# Patient Record
Sex: Male | Born: 1945 | Race: White | Hispanic: No | Marital: Married | Smoking: Current every day smoker
Health system: Southern US, Community
[De-identification: ages and names within clinical notes are randomized; demographics above are authoritative.]

## PROBLEM LIST (undated history)

## (undated) DIAGNOSIS — I7090 Unspecified atherosclerosis: Secondary | ICD-10-CM

## (undated) DIAGNOSIS — I4510 Unspecified right bundle-branch block: Secondary | ICD-10-CM

## (undated) DIAGNOSIS — I10 Essential (primary) hypertension: Secondary | ICD-10-CM

## (undated) DIAGNOSIS — Z87442 Personal history of urinary calculi: Secondary | ICD-10-CM

## (undated) DIAGNOSIS — K219 Gastro-esophageal reflux disease without esophagitis: Secondary | ICD-10-CM

## (undated) DIAGNOSIS — J449 Chronic obstructive pulmonary disease, unspecified: Secondary | ICD-10-CM

## (undated) DIAGNOSIS — I708 Atherosclerosis of other arteries: Secondary | ICD-10-CM

---

## 1968-07-06 HISTORY — PX: PILONIDAL CYST EXCISION: SHX744

## 1976-07-06 HISTORY — PX: KIDNEY SURGERY: SHX687

## 1978-07-06 HISTORY — PX: CYST REMOVAL LEG: SHX6280

## 2008-08-20 ENCOUNTER — Encounter: Admission: RE | Admit: 2008-08-20 | Discharge: 2008-08-20 | Payer: Self-pay | Admitting: Family Medicine

## 2010-02-03 ENCOUNTER — Emergency Department: Payer: Self-pay | Admitting: Emergency Medicine

## 2010-02-08 ENCOUNTER — Emergency Department: Payer: Self-pay | Admitting: Internal Medicine

## 2010-02-12 ENCOUNTER — Emergency Department: Payer: Self-pay | Admitting: Emergency Medicine

## 2012-12-22 DIAGNOSIS — Z1211 Encounter for screening for malignant neoplasm of colon: Secondary | ICD-10-CM | POA: Diagnosis not present

## 2012-12-22 DIAGNOSIS — Z23 Encounter for immunization: Secondary | ICD-10-CM | POA: Diagnosis not present

## 2012-12-22 DIAGNOSIS — Z125 Encounter for screening for malignant neoplasm of prostate: Secondary | ICD-10-CM | POA: Diagnosis not present

## 2012-12-22 DIAGNOSIS — Z0000 Encounter for general adult medical examination without abnormal findings: Secondary | ICD-10-CM | POA: Diagnosis not present

## 2012-12-22 DIAGNOSIS — I10 Essential (primary) hypertension: Secondary | ICD-10-CM | POA: Diagnosis not present

## 2012-12-22 DIAGNOSIS — R9431 Abnormal electrocardiogram [ECG] [EKG]: Secondary | ICD-10-CM | POA: Diagnosis not present

## 2012-12-22 DIAGNOSIS — I4510 Unspecified right bundle-branch block: Secondary | ICD-10-CM | POA: Diagnosis not present

## 2012-12-22 DIAGNOSIS — F17200 Nicotine dependence, unspecified, uncomplicated: Secondary | ICD-10-CM | POA: Diagnosis not present

## 2013-03-22 ENCOUNTER — Other Ambulatory Visit: Payer: Self-pay | Admitting: Family Medicine

## 2013-03-22 DIAGNOSIS — Z139 Encounter for screening, unspecified: Secondary | ICD-10-CM

## 2013-04-04 ENCOUNTER — Ambulatory Visit
Admission: RE | Admit: 2013-04-04 | Discharge: 2013-04-04 | Disposition: A | Discharge status: Home / Self Care | Payer: Medicare Other | Source: Ambulatory Visit | Attending: Family Medicine | Admitting: Family Medicine

## 2013-04-04 DIAGNOSIS — Z139 Encounter for screening, unspecified: Secondary | ICD-10-CM

## 2013-04-04 DIAGNOSIS — Z136 Encounter for screening for cardiovascular disorders: Secondary | ICD-10-CM | POA: Diagnosis not present

## 2013-07-20 DIAGNOSIS — I700 Atherosclerosis of aorta: Secondary | ICD-10-CM | POA: Diagnosis not present

## 2013-07-20 DIAGNOSIS — J449 Chronic obstructive pulmonary disease, unspecified: Secondary | ICD-10-CM | POA: Diagnosis not present

## 2013-07-20 DIAGNOSIS — I4510 Unspecified right bundle-branch block: Secondary | ICD-10-CM | POA: Diagnosis not present

## 2013-07-20 DIAGNOSIS — F17200 Nicotine dependence, unspecified, uncomplicated: Secondary | ICD-10-CM | POA: Diagnosis not present

## 2013-07-20 DIAGNOSIS — I10 Essential (primary) hypertension: Secondary | ICD-10-CM | POA: Diagnosis not present

## 2014-04-16 DIAGNOSIS — J449 Chronic obstructive pulmonary disease, unspecified: Secondary | ICD-10-CM | POA: Diagnosis not present

## 2014-04-16 DIAGNOSIS — I700 Atherosclerosis of aorta: Secondary | ICD-10-CM | POA: Diagnosis not present

## 2014-04-16 DIAGNOSIS — F17200 Nicotine dependence, unspecified, uncomplicated: Secondary | ICD-10-CM | POA: Diagnosis not present

## 2014-04-16 DIAGNOSIS — I4510 Unspecified right bundle-branch block: Secondary | ICD-10-CM | POA: Diagnosis not present

## 2014-04-16 DIAGNOSIS — D692 Other nonthrombocytopenic purpura: Secondary | ICD-10-CM | POA: Diagnosis not present

## 2014-04-16 DIAGNOSIS — Z23 Encounter for immunization: Secondary | ICD-10-CM | POA: Diagnosis not present

## 2014-04-16 DIAGNOSIS — Z125 Encounter for screening for malignant neoplasm of prostate: Secondary | ICD-10-CM | POA: Diagnosis not present

## 2014-04-16 DIAGNOSIS — Z0000 Encounter for general adult medical examination without abnormal findings: Secondary | ICD-10-CM | POA: Diagnosis not present

## 2014-04-16 DIAGNOSIS — I10 Essential (primary) hypertension: Secondary | ICD-10-CM | POA: Diagnosis not present

## 2014-05-03 DIAGNOSIS — Z1211 Encounter for screening for malignant neoplasm of colon: Secondary | ICD-10-CM | POA: Diagnosis not present

## 2014-10-18 DIAGNOSIS — J449 Chronic obstructive pulmonary disease, unspecified: Secondary | ICD-10-CM | POA: Diagnosis not present

## 2014-10-18 DIAGNOSIS — I700 Atherosclerosis of aorta: Secondary | ICD-10-CM | POA: Diagnosis not present

## 2014-10-18 DIAGNOSIS — I10 Essential (primary) hypertension: Secondary | ICD-10-CM | POA: Diagnosis not present

## 2014-10-18 DIAGNOSIS — D692 Other nonthrombocytopenic purpura: Secondary | ICD-10-CM | POA: Diagnosis not present

## 2014-10-18 DIAGNOSIS — F17200 Nicotine dependence, unspecified, uncomplicated: Secondary | ICD-10-CM | POA: Diagnosis not present

## 2014-12-10 IMAGING — US US AORTA SCREENING (MEDICARE)
1 series · 14 of 15 positions shown · non-contrast
Comparison: None.

CLINICAL DATA: AAA screening

ABDOMINAL AORTA SCREENING ULTRASOUND
TECHNIQUE: Ultrasound examination of the abdominal aorta was
performed as a screening evaluation for abdominal aortic aneurysm.

[Series 1: us aorta screening (medicare) · 0.33mm/px · 14 of 15 slices shown]
[im 1/15]
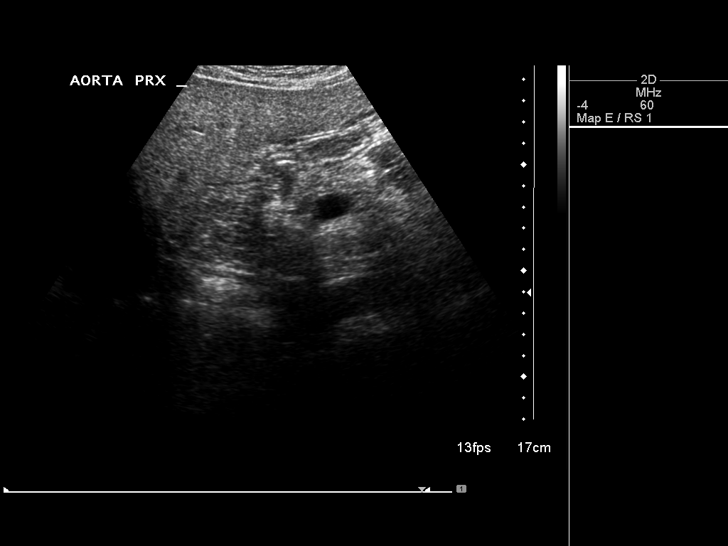
[im 2/15]
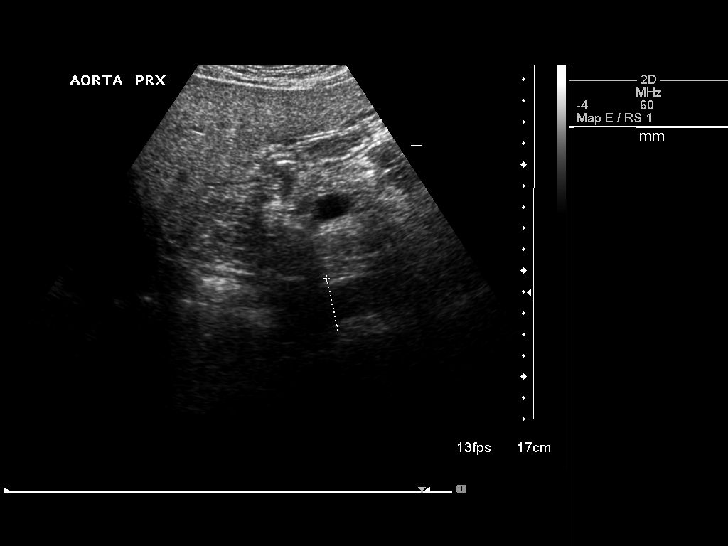
[im 3/15]
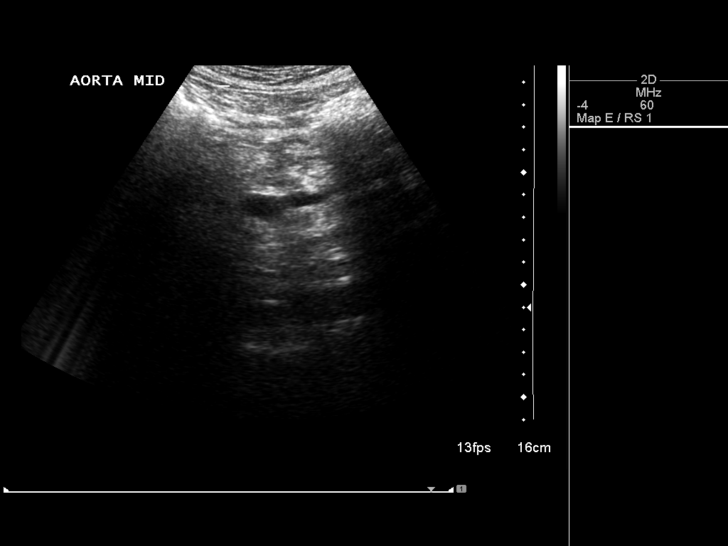
[im 4/15]
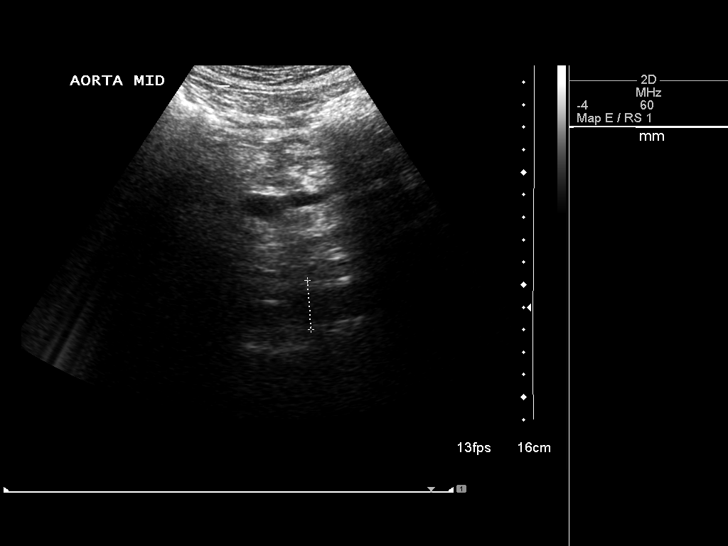
[im 5/15]
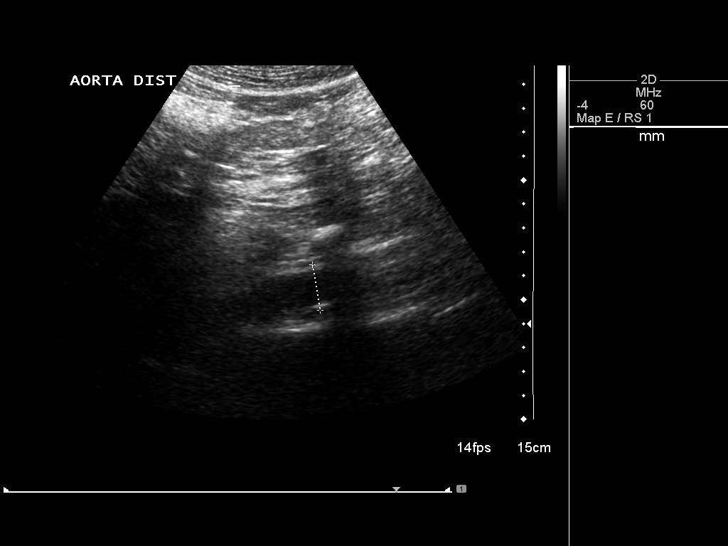
[im 6/15]
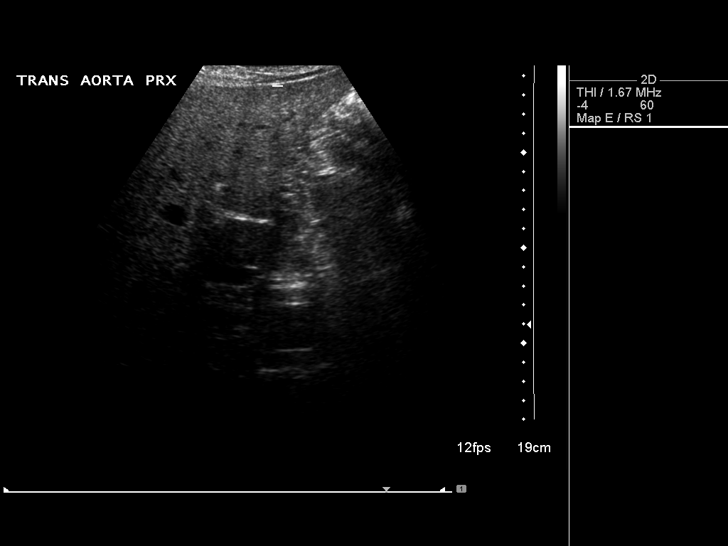
[im 7/15]
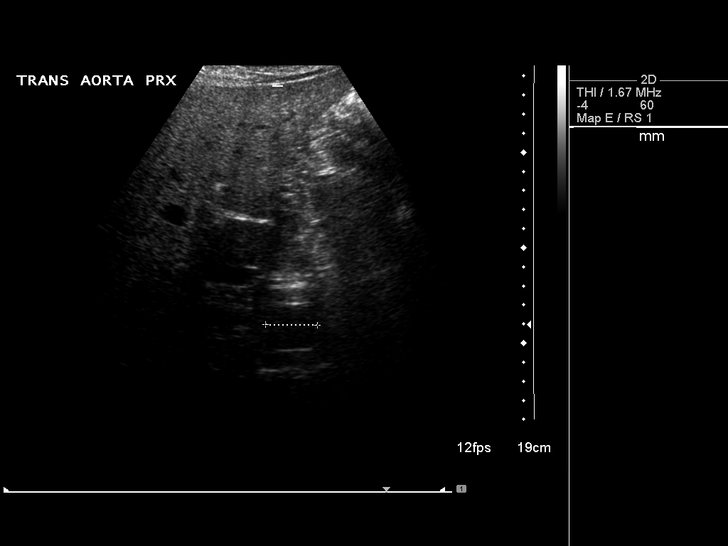
[im 9/15]
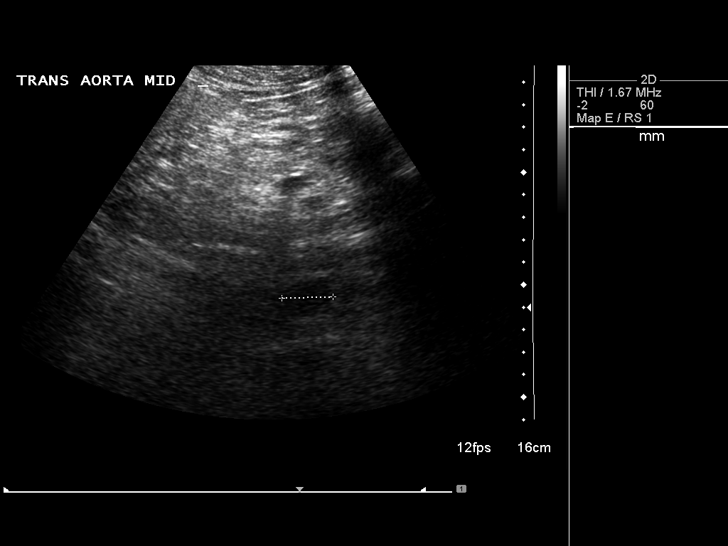
[im 10/15]
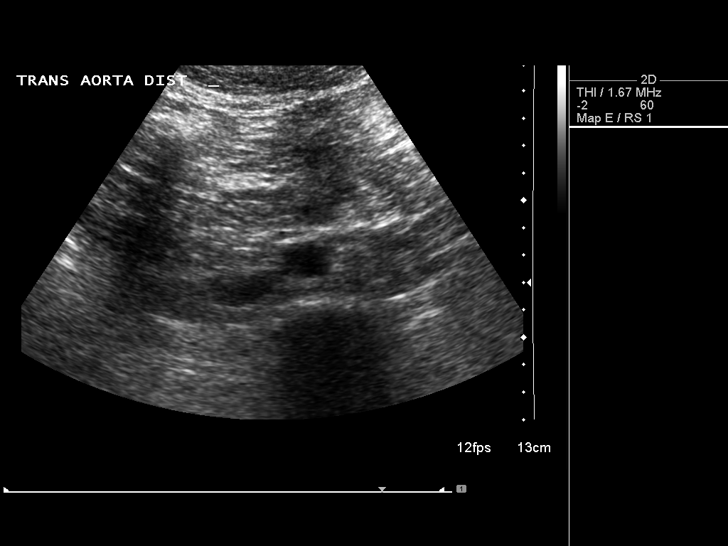
[im 11/15]
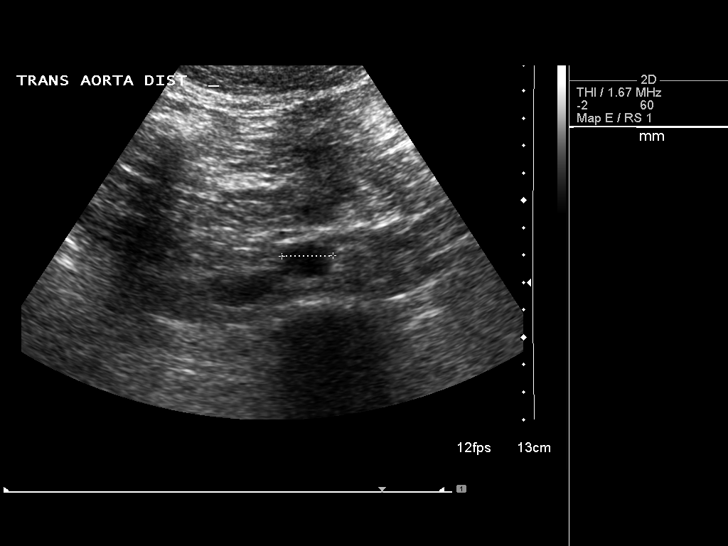
[im 12/15]
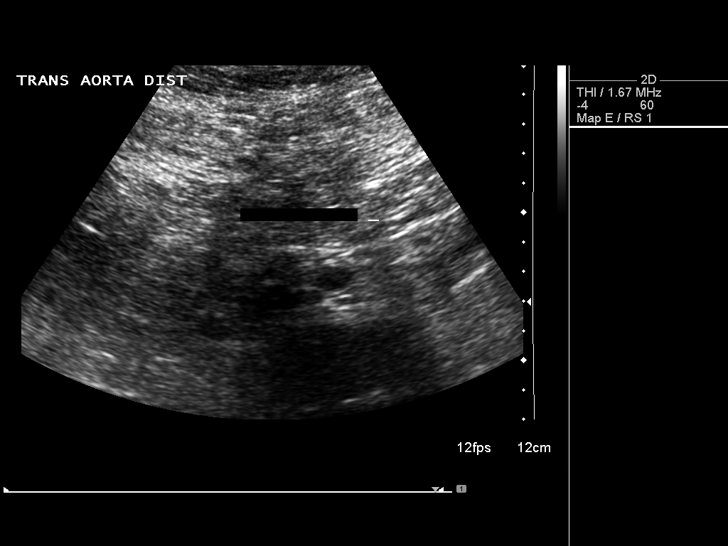
[im 13/15]
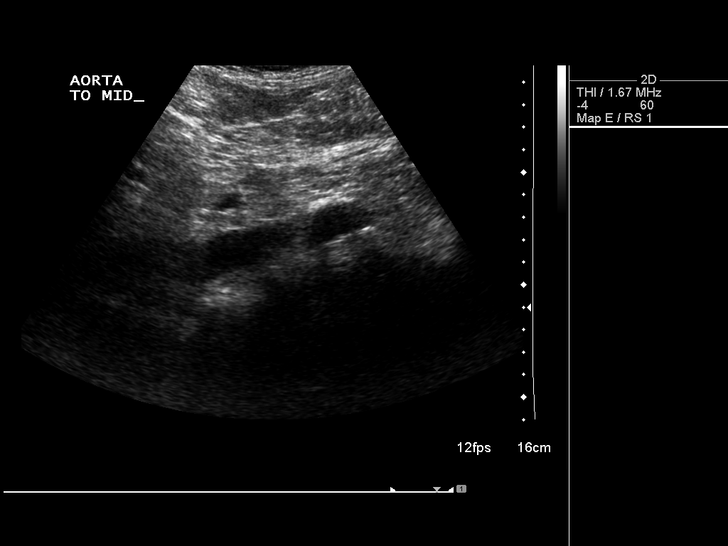
[im 14/15]
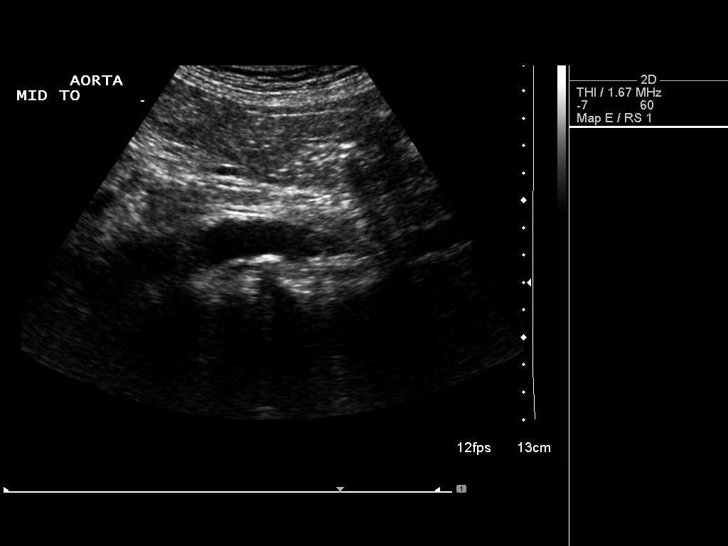
[im 15/15]
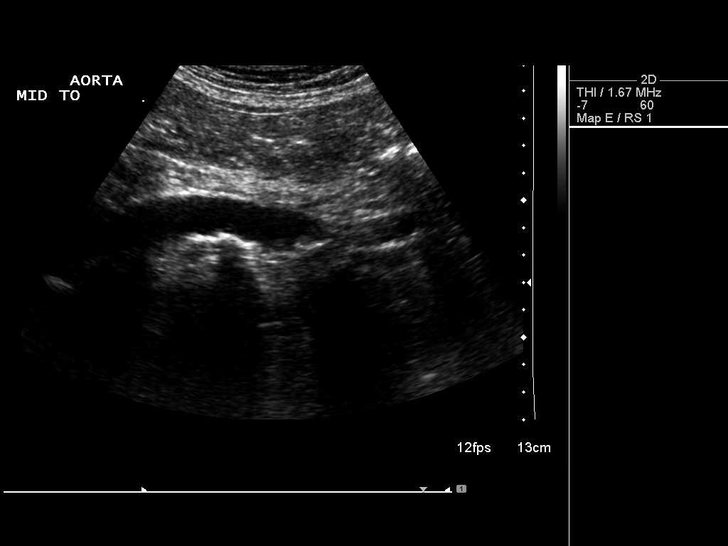

[14 of 15 positions shown; findings below may reference images not displayed]

Abdominal Aorta: Mild heterogeneous atherosclerotic plaque.  No
aneurysmal dilatation.

      Maximum AP diameter:  2.4 cm.
      Maximum TRV diameter:  2.7 cm.
IMPRESSION: Atherosclerotic plaque with out abdominal aortic aneurysmal
dilatation.

## 2015-04-24 DIAGNOSIS — F17200 Nicotine dependence, unspecified, uncomplicated: Secondary | ICD-10-CM | POA: Diagnosis not present

## 2015-04-24 DIAGNOSIS — D692 Other nonthrombocytopenic purpura: Secondary | ICD-10-CM | POA: Diagnosis not present

## 2015-04-24 DIAGNOSIS — Z125 Encounter for screening for malignant neoplasm of prostate: Secondary | ICD-10-CM | POA: Diagnosis not present

## 2015-04-24 DIAGNOSIS — Z1211 Encounter for screening for malignant neoplasm of colon: Secondary | ICD-10-CM | POA: Diagnosis not present

## 2015-04-24 DIAGNOSIS — I4510 Unspecified right bundle-branch block: Secondary | ICD-10-CM | POA: Diagnosis not present

## 2015-04-24 DIAGNOSIS — I10 Essential (primary) hypertension: Secondary | ICD-10-CM | POA: Diagnosis not present

## 2015-04-24 DIAGNOSIS — Z0000 Encounter for general adult medical examination without abnormal findings: Secondary | ICD-10-CM | POA: Diagnosis not present

## 2015-04-24 DIAGNOSIS — J449 Chronic obstructive pulmonary disease, unspecified: Secondary | ICD-10-CM | POA: Diagnosis not present

## 2015-04-24 DIAGNOSIS — I700 Atherosclerosis of aorta: Secondary | ICD-10-CM | POA: Diagnosis not present

## 2015-05-07 DIAGNOSIS — Z1211 Encounter for screening for malignant neoplasm of colon: Secondary | ICD-10-CM | POA: Diagnosis not present

## 2016-04-29 DIAGNOSIS — F17200 Nicotine dependence, unspecified, uncomplicated: Secondary | ICD-10-CM | POA: Diagnosis not present

## 2016-04-29 DIAGNOSIS — I10 Essential (primary) hypertension: Secondary | ICD-10-CM | POA: Diagnosis not present

## 2016-04-29 DIAGNOSIS — Z1211 Encounter for screening for malignant neoplasm of colon: Secondary | ICD-10-CM | POA: Diagnosis not present

## 2016-04-29 DIAGNOSIS — Z125 Encounter for screening for malignant neoplasm of prostate: Secondary | ICD-10-CM | POA: Diagnosis not present

## 2016-04-29 DIAGNOSIS — Z0000 Encounter for general adult medical examination without abnormal findings: Secondary | ICD-10-CM | POA: Diagnosis not present

## 2016-04-29 DIAGNOSIS — I700 Atherosclerosis of aorta: Secondary | ICD-10-CM | POA: Diagnosis not present

## 2016-04-29 DIAGNOSIS — J449 Chronic obstructive pulmonary disease, unspecified: Secondary | ICD-10-CM | POA: Diagnosis not present

## 2016-04-29 DIAGNOSIS — R7309 Other abnormal glucose: Secondary | ICD-10-CM | POA: Diagnosis not present

## 2016-04-29 DIAGNOSIS — I4510 Unspecified right bundle-branch block: Secondary | ICD-10-CM | POA: Diagnosis not present

## 2016-04-29 DIAGNOSIS — D692 Other nonthrombocytopenic purpura: Secondary | ICD-10-CM | POA: Diagnosis not present

## 2016-08-21 DIAGNOSIS — R809 Proteinuria, unspecified: Secondary | ICD-10-CM | POA: Diagnosis not present

## 2016-10-28 DIAGNOSIS — I700 Atherosclerosis of aorta: Secondary | ICD-10-CM | POA: Diagnosis not present

## 2016-10-28 DIAGNOSIS — R7309 Other abnormal glucose: Secondary | ICD-10-CM | POA: Diagnosis not present

## 2016-10-28 DIAGNOSIS — D692 Other nonthrombocytopenic purpura: Secondary | ICD-10-CM | POA: Diagnosis not present

## 2016-10-28 DIAGNOSIS — I10 Essential (primary) hypertension: Secondary | ICD-10-CM | POA: Diagnosis not present

## 2016-10-28 DIAGNOSIS — J449 Chronic obstructive pulmonary disease, unspecified: Secondary | ICD-10-CM | POA: Diagnosis not present

## 2016-10-28 DIAGNOSIS — F17200 Nicotine dependence, unspecified, uncomplicated: Secondary | ICD-10-CM | POA: Diagnosis not present

## 2017-05-12 ENCOUNTER — Other Ambulatory Visit (HOSPITAL_COMMUNITY): Payer: Self-pay | Admitting: Family Medicine

## 2017-05-12 ENCOUNTER — Other Ambulatory Visit: Payer: Self-pay | Admitting: Family Medicine

## 2017-05-12 ENCOUNTER — Ambulatory Visit (HOSPITAL_COMMUNITY): Payer: Medicare Other

## 2017-05-12 DIAGNOSIS — R600 Localized edema: Secondary | ICD-10-CM

## 2017-05-12 DIAGNOSIS — R609 Edema, unspecified: Secondary | ICD-10-CM

## 2017-05-12 DIAGNOSIS — R7309 Other abnormal glucose: Secondary | ICD-10-CM | POA: Diagnosis not present

## 2017-05-12 DIAGNOSIS — I700 Atherosclerosis of aorta: Secondary | ICD-10-CM | POA: Diagnosis not present

## 2017-05-12 DIAGNOSIS — D692 Other nonthrombocytopenic purpura: Secondary | ICD-10-CM | POA: Diagnosis not present

## 2017-05-12 DIAGNOSIS — Z0000 Encounter for general adult medical examination without abnormal findings: Secondary | ICD-10-CM | POA: Diagnosis not present

## 2017-05-12 DIAGNOSIS — Z1159 Encounter for screening for other viral diseases: Secondary | ICD-10-CM | POA: Diagnosis not present

## 2017-05-12 DIAGNOSIS — J449 Chronic obstructive pulmonary disease, unspecified: Secondary | ICD-10-CM | POA: Diagnosis not present

## 2017-05-12 DIAGNOSIS — F17200 Nicotine dependence, unspecified, uncomplicated: Secondary | ICD-10-CM | POA: Diagnosis not present

## 2017-05-12 DIAGNOSIS — Z1211 Encounter for screening for malignant neoplasm of colon: Secondary | ICD-10-CM | POA: Diagnosis not present

## 2017-05-12 DIAGNOSIS — Z125 Encounter for screening for malignant neoplasm of prostate: Secondary | ICD-10-CM | POA: Diagnosis not present

## 2017-05-12 DIAGNOSIS — I10 Essential (primary) hypertension: Secondary | ICD-10-CM | POA: Diagnosis not present

## 2017-05-13 ENCOUNTER — Ambulatory Visit
Admission: RE | Admit: 2017-05-13 | Discharge: 2017-05-13 | Disposition: A | Discharge status: Home / Self Care | Payer: Medicare Other | Source: Ambulatory Visit | Attending: Family Medicine | Admitting: Family Medicine

## 2017-05-13 DIAGNOSIS — R600 Localized edema: Secondary | ICD-10-CM | POA: Diagnosis not present

## 2017-05-19 DIAGNOSIS — Z1211 Encounter for screening for malignant neoplasm of colon: Secondary | ICD-10-CM | POA: Diagnosis not present

## 2017-11-10 DIAGNOSIS — F17210 Nicotine dependence, cigarettes, uncomplicated: Secondary | ICD-10-CM | POA: Diagnosis not present

## 2017-11-10 DIAGNOSIS — R7309 Other abnormal glucose: Secondary | ICD-10-CM | POA: Diagnosis not present

## 2017-11-10 DIAGNOSIS — D692 Other nonthrombocytopenic purpura: Secondary | ICD-10-CM | POA: Diagnosis not present

## 2017-11-10 DIAGNOSIS — J449 Chronic obstructive pulmonary disease, unspecified: Secondary | ICD-10-CM | POA: Diagnosis not present

## 2017-11-10 DIAGNOSIS — I10 Essential (primary) hypertension: Secondary | ICD-10-CM | POA: Diagnosis not present

## 2018-05-16 DIAGNOSIS — I10 Essential (primary) hypertension: Secondary | ICD-10-CM | POA: Diagnosis not present

## 2018-05-16 DIAGNOSIS — L989 Disorder of the skin and subcutaneous tissue, unspecified: Secondary | ICD-10-CM | POA: Diagnosis not present

## 2018-05-16 DIAGNOSIS — D0461 Carcinoma in situ of skin of right upper limb, including shoulder: Secondary | ICD-10-CM | POA: Diagnosis not present

## 2018-05-16 DIAGNOSIS — R7309 Other abnormal glucose: Secondary | ICD-10-CM | POA: Diagnosis not present

## 2018-05-16 DIAGNOSIS — K429 Umbilical hernia without obstruction or gangrene: Secondary | ICD-10-CM | POA: Diagnosis not present

## 2018-05-16 DIAGNOSIS — J449 Chronic obstructive pulmonary disease, unspecified: Secondary | ICD-10-CM | POA: Diagnosis not present

## 2018-05-16 DIAGNOSIS — Z125 Encounter for screening for malignant neoplasm of prostate: Secondary | ICD-10-CM | POA: Diagnosis not present

## 2018-05-16 DIAGNOSIS — I700 Atherosclerosis of aorta: Secondary | ICD-10-CM | POA: Diagnosis not present

## 2018-05-16 DIAGNOSIS — F17210 Nicotine dependence, cigarettes, uncomplicated: Secondary | ICD-10-CM | POA: Diagnosis not present

## 2018-05-16 DIAGNOSIS — D692 Other nonthrombocytopenic purpura: Secondary | ICD-10-CM | POA: Diagnosis not present

## 2018-05-16 DIAGNOSIS — Z0000 Encounter for general adult medical examination without abnormal findings: Secondary | ICD-10-CM | POA: Diagnosis not present

## 2018-05-16 DIAGNOSIS — Z1211 Encounter for screening for malignant neoplasm of colon: Secondary | ICD-10-CM | POA: Diagnosis not present

## 2018-05-20 DIAGNOSIS — Z1211 Encounter for screening for malignant neoplasm of colon: Secondary | ICD-10-CM | POA: Diagnosis not present

## 2018-05-27 ENCOUNTER — Ambulatory Visit: Payer: Self-pay | Admitting: Surgery

## 2018-05-27 DIAGNOSIS — K429 Umbilical hernia without obstruction or gangrene: Secondary | ICD-10-CM | POA: Diagnosis not present

## 2018-05-27 NOTE — H&P (Signed)
Terry Garcia Documented: 05/27/2018 11:36 AM Location: [facility] Patient #: [id number] DOB: [birth year] Married / Language: [place] / Race: White Male  History of Present Illness Maisie Fus A. Niva Murren MD; 05/27/2018 11:37 AM) Patient words: Patient sent at the request of Dr. Cam Hai for umbilical hernia. The patient has had this hernia for over 20 years. The larger and causing more discomfort. He is a smoker. He has no intention of quitting. The bulge is made worse with straining, coughing or lifting.  The patient is a 72 year old male.    Physical Exam (Thelia Tanksley A. Chinmay Squier MD; 05/27/2018 11:38 AM)  General Mental Status-Alert. General Appearance-Consistent with stated age. Hydration-Well hydrated. Voice-Normal.  Head and Neck Head-normocephalic, atraumatic with no lesions or palpable masses. Trachea-midline. Thyroid Gland Characteristics - normal size and consistency.  Chest and Lung Exam Chest and lung exam reveals -quiet, even and easy respiratory effort with no use of accessory muscles and on auscultation, normal breath sounds, no adventitious sounds and normal vocal resonance. Inspection Chest Wall - Normal. Back - normal.  Cardiovascular Cardiovascular examination reveals -normal heart sounds, regular rate and rhythm with no murmurs and normal pedal pulses bilaterally.  Abdomen Note: Large reducible umbilical hernia. No rebound or guarding.  Neurologic Neurologic evaluation reveals -alert and oriented x 3 with no impairment of recent or remote memory. Mental Status-Normal.  Musculoskeletal Normal Exam - Left-Upper Extremity Strength Normal and Lower Extremity Strength Normal. Normal Exam - Right-Upper Extremity Strength Normal and Lower Extremity Strength Normal.    Assessment & Plan (Claud Gowan A. Annabel Gibeau MD; 05/27/2018 11:38 AM)  UMBILICAL HERNIA WITHOUT OBSTRUCTION AND WITHOUT GANGRENE (K42.9) Impression: Discussed  repair with mesh. It is getting larger. Discussed increased operative risk given his continued tobacco abuse at high risk for complication of postoperative morbidity and potential mortality. He understands he involved. I discussed the use of mesh and chronic pain as well. The risk of hernia repair include bleeding, infection, organ injury, bowel injury, bladder injury, nerve injury recurrent hernia, blood clots, worsening of underlying condition, chronic pain, mesh use, open surgery, death, and the need for other operattions. Pt agrees to proceed  Current Plans You are being scheduled for surgery- Our schedulers will call you.  You should hear from our office's scheduling department within 5 working days about the location, date, and time of surgery. We try to make accommodations for patient's preferences in scheduling surgery, but sometimes the OR schedule or the surgeon's schedule prevents [place] from making those accommodations.  If you have not heard from our office [phone]) in 5 working days, call the office and ask for your surgeon's nurse.  If you have other questions about your diagnosis, plan, or surgery, call the office and ask for your surgeon's nurse.  Pt Education - Pamphlet Given - Hernia Surgery: discussed with patient and provided information. The anatomy & physiology of the abdominal wall was discussed. The pathophysiology of hernias was discussed. Natural history risks without surgery including progeressive enlargement, pain, incarceration, & strangulation was discussed. Contributors to complications such as smoking, obesity, diabetes, prior surgery, etc were discussed.  I feel the risks of no intervention will lead to serious problems that outweigh the operative risks; therefore, I recommended surgery to reduce and repair the hernia. I explaine an open approach. I noted the probable use of mesh to patch and/or buttress the hernia repair  Risks such as bleeding,  infection, abscess, need for further treatment, heart attack, death, and other risks were discussed. I noted  a good likelihood this will help address the problem. Goals of post-operative recovery were discussed as well. Possibility that this will not correct all symptoms was explained. I stressed the importance of low-impact activity, aggressive pain control, avoiding constipation, & not pushing through pain to minimize risk of post-operative chronic pain or injury. Possibility of reherniation especially with smoking, obesity, diabetes, immunosuppression, and other health conditions was discussed. We will work to minimize complications.  An educational handout further explaining the pathology & treatment options was given as well. Questions were answered. The patient expresses understanding & wishes to proceed with surgery.  Pt Education - CCS Mesh education: discussed with patient and provided information.

## 2018-07-04 ENCOUNTER — Other Ambulatory Visit: Payer: Self-pay

## 2018-07-04 ENCOUNTER — Encounter (HOSPITAL_BASED_OUTPATIENT_CLINIC_OR_DEPARTMENT_OTHER): Payer: Self-pay

## 2018-07-07 ENCOUNTER — Encounter (HOSPITAL_BASED_OUTPATIENT_CLINIC_OR_DEPARTMENT_OTHER)
Admission: RE | Admit: 2018-07-07 | Discharge: 2018-07-07 | Disposition: A | Discharge status: Home / Self Care | Payer: Medicare Other | Source: Ambulatory Visit | Attending: Surgery | Admitting: Surgery

## 2018-07-07 DIAGNOSIS — Z01818 Encounter for other preprocedural examination: Secondary | ICD-10-CM | POA: Diagnosis not present

## 2018-07-07 LAB — CBC WITH DIFFERENTIAL/PLATELET
Abs Immature Granulocytes: 0.01 10*3/uL (ref 0.00–0.07)
BASOS PCT: 1 %
Basophils Absolute: 0 10*3/uL (ref 0.0–0.1)
Eosinophils Absolute: 0.4 10*3/uL (ref 0.0–0.5)
Eosinophils Relative: 5 %
HCT: 49.7 % (ref 39.0–52.0)
Hemoglobin: 16.7 g/dL (ref 13.0–17.0)
Immature Granulocytes: 0 %
Lymphocytes Relative: 40 %
Lymphs Abs: 2.8 10*3/uL (ref 0.7–4.0)
MCH: 30.4 pg (ref 26.0–34.0)
MCHC: 33.6 g/dL (ref 30.0–36.0)
MCV: 90.5 fL (ref 80.0–100.0)
Monocytes Absolute: 0.7 10*3/uL (ref 0.1–1.0)
Monocytes Relative: 10 %
Neutro Abs: 3.1 10*3/uL (ref 1.7–7.7)
Neutrophils Relative %: 44 %
PLATELETS: 162 10*3/uL (ref 150–400)
RBC: 5.49 MIL/uL (ref 4.22–5.81)
RDW: 12.5 % (ref 11.5–15.5)
WBC: 6.9 10*3/uL (ref 4.0–10.5)
nRBC: 0 % (ref 0.0–0.2)

## 2018-07-07 LAB — COMPREHENSIVE METABOLIC PANEL
ALT: 35 U/L (ref 0–44)
AST: 26 U/L (ref 15–41)
Albumin: 4.1 g/dL (ref 3.5–5.0)
Alkaline Phosphatase: 52 U/L (ref 38–126)
Anion gap: 9 (ref 5–15)
BUN: 5 mg/dL — AB (ref 8–23)
CO2: 29 mmol/L (ref 22–32)
Calcium: 10 mg/dL (ref 8.9–10.3)
Chloride: 98 mmol/L (ref 98–111)
Creatinine, Ser: 0.79 mg/dL (ref 0.61–1.24)
GFR calc Af Amer: >60 mL/min (ref >60)
GFR calc non Af Amer: >60 mL/min (ref >60)
Glucose, Bld: 113 mg/dL — ABNORMAL HIGH (ref 70–99)
POTASSIUM: 4.2 mmol/L (ref 3.5–5.1)
Sodium: 136 mmol/L (ref 135–145)
Total Bilirubin: 0.8 mg/dL (ref 0.3–1.2)
Total Protein: 6.9 g/dL (ref 6.5–8.1)

## 2018-07-07 NOTE — Progress Notes (Addendum)
Ensure pre surgery drink given with instructions to complete by [facility], pt verbalized understanding.  EKG reviewed by Dr. Armond Hang, will proceed with surgery as scheduled.

## 2018-07-13 ENCOUNTER — Ambulatory Visit (HOSPITAL_BASED_OUTPATIENT_CLINIC_OR_DEPARTMENT_OTHER)
Admission: RE | Admit: 2018-07-13 | Discharge: 2018-07-13 | Disposition: A | Discharge status: Home / Self Care | Payer: Medicare Other | Attending: Surgery | Admitting: Surgery

## 2018-07-13 ENCOUNTER — Ambulatory Visit (HOSPITAL_BASED_OUTPATIENT_CLINIC_OR_DEPARTMENT_OTHER): Payer: Medicare Other | Admitting: Anesthesiology

## 2018-07-13 ENCOUNTER — Other Ambulatory Visit: Payer: Self-pay

## 2018-07-13 ENCOUNTER — Encounter (HOSPITAL_BASED_OUTPATIENT_CLINIC_OR_DEPARTMENT_OTHER): Payer: Self-pay

## 2018-07-13 ENCOUNTER — Encounter (HOSPITAL_BASED_OUTPATIENT_CLINIC_OR_DEPARTMENT_OTHER)
Admission: RE | Disposition: A | Discharge status: Home / Self Care | Payer: Self-pay | Source: Home / Self Care | Attending: Surgery

## 2018-07-13 DIAGNOSIS — J449 Chronic obstructive pulmonary disease, unspecified: Secondary | ICD-10-CM | POA: Insufficient documentation

## 2018-07-13 DIAGNOSIS — Z79899 Other long term (current) drug therapy: Secondary | ICD-10-CM | POA: Insufficient documentation

## 2018-07-13 DIAGNOSIS — K429 Umbilical hernia without obstruction or gangrene: Secondary | ICD-10-CM | POA: Insufficient documentation

## 2018-07-13 DIAGNOSIS — F17200 Nicotine dependence, unspecified, uncomplicated: Secondary | ICD-10-CM | POA: Diagnosis not present

## 2018-07-13 DIAGNOSIS — I10 Essential (primary) hypertension: Secondary | ICD-10-CM | POA: Insufficient documentation

## 2018-07-13 DIAGNOSIS — Z419 Encounter for procedure for purposes other than remedying health state, unspecified: Secondary | ICD-10-CM

## 2018-07-13 HISTORY — DX: Essential (primary) hypertension: I10

## 2018-07-13 HISTORY — PX: INSERTION OF MESH: SHX5868

## 2018-07-13 HISTORY — DX: Unspecified atherosclerosis: I70.90

## 2018-07-13 HISTORY — DX: Personal history of urinary calculi: Z87.442

## 2018-07-13 HISTORY — DX: Chronic obstructive pulmonary disease, unspecified: J44.9

## 2018-07-13 HISTORY — DX: Atherosclerosis of other arteries: I70.8

## 2018-07-13 HISTORY — DX: Gastro-esophageal reflux disease without esophagitis: K21.9

## 2018-07-13 HISTORY — PX: UMBILICAL HERNIA REPAIR: SHX196

## 2018-07-13 HISTORY — DX: Unspecified right bundle-branch block: I45.10

## 2018-07-13 SURGERY — REPAIR, HERNIA, UMBILICAL, ADULT
Anesthesia: General | Site: Abdomen

## 2018-07-13 MED ORDER — PROPOFOL 10 MG/ML IV BOLUS
INTRAVENOUS | Status: DC | PRN
  Administered 2018-07-13: 150 mg via INTRAVENOUS

## 2018-07-13 MED ORDER — ACETAMINOPHEN 500 MG PO TABS
ORAL_TABLET | ORAL | Status: AC
  Filled 2018-07-13: qty 2

## 2018-07-13 MED ORDER — FENTANYL CITRATE (PF) 100 MCG/2ML IJ SOLN
INTRAMUSCULAR | Status: DC | PRN
  Administered 2018-07-13: 100 ug via INTRAVENOUS

## 2018-07-13 MED ORDER — CHLORHEXIDINE GLUCONATE CLOTH 2 % EX PADS: 6.0000 | MEDICATED_PAD | Freq: Once | CUTANEOUS | Status: DC

## 2018-07-13 MED ORDER — MIDAZOLAM HCL 2 MG/2ML IJ SOLN: 1.0000 mg | INTRAMUSCULAR | Status: DC | PRN

## 2018-07-13 MED ORDER — LACTATED RINGERS IV SOLN
INTRAVENOUS | Status: DC
  Administered 2018-07-13: 09:00:00 via INTRAVENOUS

## 2018-07-13 MED ORDER — IBUPROFEN 800 MG PO TABS: 800.0000 mg | ORAL_TABLET | Freq: Three times a day (TID) | ORAL | 0 refills | Status: DC | PRN

## 2018-07-13 MED ORDER — OXYCODONE HCL 5 MG PO TABS: 5.0000 mg | ORAL_TABLET | Freq: Four times a day (QID) | ORAL | 0 refills | Status: DC | PRN

## 2018-07-13 MED ORDER — SUGAMMADEX SODIUM 200 MG/2ML IV SOLN
INTRAVENOUS | Status: AC
  Filled 2018-07-13: qty 2

## 2018-07-13 MED ORDER — LIDOCAINE HCL (CARDIAC) PF 100 MG/5ML IV SOSY
PREFILLED_SYRINGE | INTRAVENOUS | Status: DC | PRN
  Administered 2018-07-13: 50 mg via INTRAVENOUS

## 2018-07-13 MED ORDER — FENTANYL CITRATE (PF) 100 MCG/2ML IJ SOLN: 50.0000 ug | INTRAMUSCULAR | Status: DC | PRN

## 2018-07-13 MED ORDER — CEFAZOLIN SODIUM-DEXTROSE 2-4 GM/100ML-% IV SOLN
2.0000 g | INTRAVENOUS | Status: AC
  Administered 2018-07-13: 2 g via INTRAVENOUS

## 2018-07-13 MED ORDER — FENTANYL CITRATE (PF) 100 MCG/2ML IJ SOLN
INTRAMUSCULAR | Status: AC
  Filled 2018-07-13: qty 2

## 2018-07-13 MED ORDER — PROMETHAZINE HCL 25 MG/ML IJ SOLN: 6.2500 mg | INTRAMUSCULAR | Status: DC | PRN

## 2018-07-13 MED ORDER — CELECOXIB 200 MG PO CAPS
ORAL_CAPSULE | ORAL | Status: AC
  Filled 2018-07-13: qty 1

## 2018-07-13 MED ORDER — ROCURONIUM BROMIDE 50 MG/5ML IV SOSY
PREFILLED_SYRINGE | INTRAVENOUS | Status: AC
  Filled 2018-07-13: qty 5

## 2018-07-13 MED ORDER — BUPIVACAINE-EPINEPHRINE 0.25% -1:200000 IJ SOLN
INTRAMUSCULAR | Status: DC | PRN
  Administered 2018-07-13: 10 mL

## 2018-07-13 MED ORDER — GABAPENTIN 300 MG PO CAPS
ORAL_CAPSULE | ORAL | Status: AC
  Filled 2018-07-13: qty 1

## 2018-07-13 MED ORDER — DEXAMETHASONE SODIUM PHOSPHATE 10 MG/ML IJ SOLN
INTRAMUSCULAR | Status: AC
  Filled 2018-07-13: qty 1

## 2018-07-13 MED ORDER — MIDAZOLAM HCL 2 MG/2ML IJ SOLN
INTRAMUSCULAR | Status: AC
  Filled 2018-07-13: qty 2

## 2018-07-13 MED ORDER — LIDOCAINE 2% (20 MG/ML) 5 ML SYRINGE
INTRAMUSCULAR | Status: AC
  Filled 2018-07-13: qty 5

## 2018-07-13 MED ORDER — MIDAZOLAM HCL 5 MG/5ML IJ SOLN
INTRAMUSCULAR | Status: DC | PRN
  Administered 2018-07-13: 2 mg via INTRAVENOUS

## 2018-07-13 MED ORDER — LACTATED RINGERS IV SOLN: INTRAVENOUS | Status: DC

## 2018-07-13 MED ORDER — PROPOFOL 10 MG/ML IV BOLUS
INTRAVENOUS | Status: AC
  Filled 2018-07-13: qty 20

## 2018-07-13 MED ORDER — CELECOXIB 200 MG PO CAPS
200.0000 mg | ORAL_CAPSULE | ORAL | Status: AC
  Administered 2018-07-13: 200 mg via ORAL

## 2018-07-13 MED ORDER — SCOPOLAMINE 1 MG/3DAYS TD PT72: 1.0000 | MEDICATED_PATCH | Freq: Once | TRANSDERMAL | Status: DC | PRN

## 2018-07-13 MED ORDER — OXYCODONE HCL 5 MG PO TABS: 5.0000 mg | ORAL_TABLET | Freq: Once | ORAL | Status: DC | PRN

## 2018-07-13 MED ORDER — CEFAZOLIN SODIUM-DEXTROSE 2-4 GM/100ML-% IV SOLN
INTRAVENOUS | Status: AC
  Filled 2018-07-13: qty 100

## 2018-07-13 MED ORDER — SUGAMMADEX SODIUM 200 MG/2ML IV SOLN
INTRAVENOUS | Status: DC | PRN
  Administered 2018-07-13: 200 mg via INTRAVENOUS

## 2018-07-13 MED ORDER — FENTANYL CITRATE (PF) 100 MCG/2ML IJ SOLN: 25.0000 ug | INTRAMUSCULAR | Status: DC | PRN

## 2018-07-13 MED ORDER — ONDANSETRON HCL 4 MG/2ML IJ SOLN
INTRAMUSCULAR | Status: DC | PRN
  Administered 2018-07-13: 4 mg via INTRAVENOUS

## 2018-07-13 MED ORDER — ROCURONIUM BROMIDE 100 MG/10ML IV SOLN
INTRAVENOUS | Status: DC | PRN
  Administered 2018-07-13: 40 mg via INTRAVENOUS

## 2018-07-13 MED ORDER — DEXAMETHASONE SODIUM PHOSPHATE 10 MG/ML IJ SOLN
INTRAMUSCULAR | Status: DC | PRN
  Administered 2018-07-13: 4 mg via INTRAVENOUS

## 2018-07-13 MED ORDER — OXYCODONE HCL 5 MG/5ML PO SOLN: 5.0000 mg | Freq: Once | ORAL | Status: DC | PRN

## 2018-07-13 MED ORDER — ACETAMINOPHEN 500 MG PO TABS
1000.0000 mg | ORAL_TABLET | ORAL | Status: AC
  Administered 2018-07-13: 1000 mg via ORAL

## 2018-07-13 MED ORDER — GABAPENTIN 300 MG PO CAPS
300.0000 mg | ORAL_CAPSULE | ORAL | Status: AC
  Administered 2018-07-13: 300 mg via ORAL

## 2018-07-13 MED ORDER — MEPERIDINE HCL 25 MG/ML IJ SOLN: 6.2500 mg | INTRAMUSCULAR | Status: DC | PRN

## 2018-07-13 MED ORDER — ONDANSETRON HCL 4 MG/2ML IJ SOLN
INTRAMUSCULAR | Status: AC
  Filled 2018-07-13: qty 2

## 2018-07-13 SURGICAL SUPPLY — 56 items
ADH SKN CLS APL DERMABOND .7 (GAUZE/BANDAGES/DRESSINGS) ×1
APL SKNCLS STERI-STRIP NONHPOA (GAUZE/BANDAGES/DRESSINGS)
BENZOIN TINCTURE PRP APPL 2/3 (GAUZE/BANDAGES/DRESSINGS) IMPLANT
BINDER ABDOMINAL 12 SM 30-45 (SOFTGOODS) ×2 IMPLANT
BLADE CLIPPER SURG (BLADE) ×1 IMPLANT
BLADE SURG 10 STRL SS (BLADE) IMPLANT
BLADE SURG 15 STRL LF DISP TIS (BLADE) ×1 IMPLANT
BLADE SURG 15 STRL SS (BLADE) ×2
CANISTER SUCT 1200ML W/VALVE (MISCELLANEOUS) IMPLANT
CHLORAPREP W/TINT 26ML (MISCELLANEOUS) ×2 IMPLANT
COVER BACK TABLE 60X90IN (DRAPES) ×2 IMPLANT
COVER MAYO STAND STRL (DRAPES) ×2 IMPLANT
COVER WAND RF STERILE (DRAPES) IMPLANT
DECANTER SPIKE VIAL GLASS SM (MISCELLANEOUS) IMPLANT
DERMABOND ADVANCED (GAUZE/BANDAGES/DRESSINGS) ×1
DERMABOND ADVANCED .7 DNX12 (GAUZE/BANDAGES/DRESSINGS) ×1 IMPLANT
DRAPE LAPAROTOMY TRNSV 102X78 (DRAPE) ×2 IMPLANT
DRAPE UTILITY XL STRL (DRAPES) ×2 IMPLANT
DRSG TEGADERM 4X4.75 (GAUZE/BANDAGES/DRESSINGS) IMPLANT
ELECT COATED BLADE 2.86 ST (ELECTRODE) ×2 IMPLANT
ELECT REM PT RETURN 9FT ADLT (ELECTROSURGICAL) ×2
ELECTRODE REM PT RTRN 9FT ADLT (ELECTROSURGICAL) ×1 IMPLANT
GLOVE BIO SURGEON STRL SZ7 (GLOVE) ×1 IMPLANT
GLOVE BIOGEL PI IND STRL 7.0 (GLOVE) IMPLANT
GLOVE BIOGEL PI IND STRL 7.5 (GLOVE) IMPLANT
GLOVE BIOGEL PI IND STRL 8 (GLOVE) ×1 IMPLANT
GLOVE BIOGEL PI INDICATOR 7.0 (GLOVE) ×1
GLOVE BIOGEL PI INDICATOR 7.5 (GLOVE) ×1
GLOVE BIOGEL PI INDICATOR 8 (GLOVE) ×1
GLOVE ECLIPSE 8.0 STRL XLNG CF (GLOVE) ×2 IMPLANT
GOWN STRL REUS W/ TWL LRG LVL3 (GOWN DISPOSABLE) ×2 IMPLANT
GOWN STRL REUS W/TWL LRG LVL3 (GOWN DISPOSABLE) ×4
MESH VENTRALEX ST 1-7/10 CRC S (Mesh General) ×1 IMPLANT
NDL HYPO 25X1 1.5 SAFETY (NEEDLE) ×1 IMPLANT
NEEDLE HYPO 22GX1.5 SAFETY (NEEDLE) IMPLANT
NEEDLE HYPO 25X1 1.5 SAFETY (NEEDLE) ×2 IMPLANT
NS IRRIG 1000ML POUR BTL (IV SOLUTION) ×1 IMPLANT
PACK BASIN DAY SURGERY FS (CUSTOM PROCEDURE TRAY) ×2 IMPLANT
PENCIL BUTTON HOLSTER BLD 10FT (ELECTRODE) ×2 IMPLANT
SLEEVE SCD COMPRESS KNEE MED (MISCELLANEOUS) ×2 IMPLANT
STAPLER VISISTAT 35W (STAPLE) IMPLANT
STRIP CLOSURE SKIN 1/2X4 (GAUZE/BANDAGES/DRESSINGS) IMPLANT
SUT MON AB 4-0 PC3 18 (SUTURE) ×2 IMPLANT
SUT NOVA 1 T20/GS 25DT (SUTURE) ×2 IMPLANT
SUT NOVA NAB DX-16 0-1 5-0 T12 (SUTURE) IMPLANT
SUT NOVA NAB GS-21 1 T12 (SUTURE) IMPLANT
SUT NOVA NAB GS-22 2 0 T19 (SUTURE) IMPLANT
SUT SILK 3 0 SH 30 (SUTURE) IMPLANT
SUT VIC AB 2-0 SH 27 (SUTURE) ×2
SUT VIC AB 2-0 SH 27XBRD (SUTURE) ×1 IMPLANT
SUT VICRYL 3-0 CR8 SH (SUTURE) ×2 IMPLANT
SYR CONTROL 10ML LL (SYRINGE) ×2 IMPLANT
TOWEL GREEN STERILE FF (TOWEL DISPOSABLE) ×2 IMPLANT
TOWEL OR NON WOVEN STRL DISP B (DISPOSABLE) ×2 IMPLANT
TUBE CONNECTING 20X1/4 (TUBING) IMPLANT
YANKAUER SUCT BULB TIP NO VENT (SUCTIONS) IMPLANT

## 2018-07-13 NOTE — Interval H&P Note (Signed)
History and Physical Interval Note:  07/13/2018 9:33 AM  Terry Garcia  has presented today for surgery, with the diagnosis of UMBILICAL HERNIA  The various methods of treatment have been discussed with the patient and family. After consideration of risks, benefits and other options for treatment, the patient has consented to  Procedure(s): UMBILICAL HERNIA REPAIR WITH MESH (N/A) INSERTION OF MESH (N/A) as a surgical intervention .  The patient's history has been reviewed, patient examined, no change in status, stable for surgery.  I have reviewed the patient's chart and labs.  Questions were answered to the patient's satisfaction.     Christain Niznik A Dhyan Noah

## 2018-07-13 NOTE — H&P (Signed)
Terry Garcia  Location: [facility] Patient #: [id number] DOB: 1945/12/07 Married / Language: English / Race: White Male  History of Present Illness  Patient words: Patient sent at the request of Dr. Cam Hai for umbilical hernia. The patient has had this hernia for over 20 years. The larger and causing more discomfort. He is a smoker. He has no intention of quitting. The bulge is made worse with straining, coughing or lifting.  The patient is a 73 year old male.    Physical Exam   General Mental Status-Alert. General Appearance-Consistent with stated age. Hydration-Well hydrated. Voice-Normal.  Head and Neck Head-normocephalic, atraumatic with no lesions or palpable masses. Trachea-midline. Thyroid Gland Characteristics - normal size and consistency.  Chest and Lung Exam Chest and lung exam reveals -quiet, even and easy respiratory effort with no use of accessory muscles and on auscultation, normal breath sounds, no adventitious sounds and normal vocal resonance. Inspection Chest Wall - Normal. Back - normal.  Cardiovascular Cardiovascular examination reveals -normal heart sounds, regular rate and rhythm with no murmurs and normal pedal pulses bilaterally.  Abdomen Note: Large reducible umbilical hernia. No rebound or guarding.  Neurologic Neurologic evaluation reveals -alert and oriented x 3 with no impairment of recent or remote memory. Mental Status-Normal.  Musculoskeletal Normal Exam - Left-Upper Extremity Strength Normal and Lower Extremity Strength Normal. Normal Exam - Right-Upper Extremity Strength Normal and Lower Extremity Strength Normal.    Assessment & Plan   UMBILICAL HERNIA WITHOUT OBSTRUCTION AND WITHOUT GANGRENE (K42.9) Impression: Discussed repair with mesh. It is getting larger. Discussed increased operative risk given his continued tobacco abuse at high risk for complication of  postoperative morbidity and potential mortality. He understands he involved. I discussed the use of mesh and chronic pain as well. The risk of hernia repair include bleeding, infection, organ injury, bowel injury, bladder injury, nerve injury recurrent hernia, blood clots, worsening of underlying condition, chronic pain, mesh use, open surgery, death, and the need for other operattions. Pt agrees to proceed  Current Plans You are being scheduled for surgery- Our schedulers will call you.  You should hear from our office's scheduling department within 5 working days about the location, date, and time of surgery. We try to make accommodations for patient's preferences in scheduling surgery, but sometimes the OR schedule or the surgeon's schedule prevents [place] from making those accommodations.  If you have not heard from our office [phone]) in 5 working days, call the office and ask for your surgeon's nurse.  If you have other questions about your diagnosis, plan, or surgery, call the office and ask for your surgeon's nurse.  Pt Education - Pamphlet Given - Hernia Surgery: discussed with patient and provided information. The anatomy & physiology of the abdominal wall was discussed. The pathophysiology of hernias was discussed. Natural history risks without surgery including progeressive enlargement, pain, incarceration, & strangulation was discussed. Contributors to complications such as smoking, obesity, diabetes, prior surgery, etc were discussed.  I feel the risks of no intervention will lead to serious problems that outweigh the operative risks; therefore, I recommended surgery to reduce and repair the hernia. I explaine an open approach. I noted the probable use of mesh to patch and/or buttress the hernia repair  Risks such as bleeding, infection, abscess, need for further treatment, heart attack, death, and other risks were discussed. I noted a good likelihood this will help  address the problem. Goals of post-operative recovery were discussed as well. Possibility that this will  not correct all symptoms was explained. I stressed the importance of low-impact activity, aggressive pain control, avoiding constipation, & not pushing through pain to minimize risk of post-operative chronic pain or injury. Possibility of reherniation especially with smoking, obesity, diabetes, immunosuppression, and other health conditions was discussed. We will work to minimize complications.  An educational handout further explaining the pathology & treatment options was given as well. Questions were answered. The patient expresses understanding & wishes to proceed with surgery.  Pt Education - CCS Mesh education: discussed with patient and provided information.        Electronically signed by Harriette Bouillon, MD at 05/27/2018 11:39 AM     Pre-op/Pre-procedure Orders on 05/27/2018       Routing History       Detailed Report

## 2018-07-13 NOTE — Op Note (Signed)
Terry Garcia [birth year] [id number] 07/13/2018  Preoperative diagnosis: Reducible umbilical hernia  Postoperative diagnosis: Same  Procedure: Umbilical Hernia Repair with ventral Lex 4.3 cm circular coated mesh in the submuscular position  Surgeon: Harriette Bouillon, MD, FACS  Anesthesia: General and 0.25% Sensorcaine local with epinephrine   Clinical History and Indications: The patient presents for repair of a longstanding moderate sized umbilical hernia.  It has been causing him pain and is getting larger.  He desires repair.The risk of hernia repair include bleeding,  Infection,   Recurrence of the hernia,  Mesh use, chronic pain,  Organ injury,  Bowel injury,  Bladder injury,   nerve injury with numbness around the incision,  Death,  and worsening of preexisting  medical problems.  The alternatives to surgery have been discussed as well..  Long term expectations of both operative and non operative treatments have been discussed.   The patient agrees to proceed.  Procedure: The patient was seen in the preoperative area and the plans for the procedure reviewed again. He  had no further questions. I marked the area of the umbilicus as the operative site. He wishes to prodeed.  The patient was taken to the operating room and after satisfactory general anesthesia had been obtained the area was clipped as needed, prepped and draped. The timeout was performed.  I used some 0.25% Sensorcaine local with epinephrine anesthesia to help with postoperative pain management. This was infiltrated around the umbilical area and additional infiltrated as I worked.  A curvilinear incision was made on the inferior aspect of the umbilicus. The umbilical skin was elevated off of the hernia sac. The hernia sac was dissected free of the subcutaneous tissues.  There were some omental adhesions to the undersurface of skin taken down with Metzenbaum scissors under direct vision.  Hemostasis was excellent.  A 4.3 cm  circular coated mesh was used and secured to the undersurface of the fascia of the defect which measured about 3 cm in maximal diameter using #1 Novafil suture.  This was done circumferentially with care taken not to injure any internal viscera.  There are no gaps in the mesh and there was no undue tension.  The fascia was closed over the mesh with interrupted #1 Novafil suture.  Excess skin from the umbilicus was removed.  The wound was closed with 3-0 Vicryl and 4-0 Monocryl.  All final counts were found to be correct.  Once the repair was complete the incision was closed by using some 3-0 Vicryl subcutaneous and 4-0 Monocryl subcuticular sutures.  Dermabond applied.  Abdominal binder placed.  The patient tolerated the procedure well. There were no operative complications. There was minimal blood loss. All counts were correct. He was taken to the PACU in satisfactory condition.  Dortha Schwalbe, MD, FACS 07/13/2018 10:58 AM

## 2018-07-13 NOTE — Anesthesia Postprocedure Evaluation (Signed)
Anesthesia Post Note  Patient: KHYRIN MABERY  Procedure(s) Performed: UMBILICAL HERNIA REPAIR WITH MESH (N/A Abdomen) INSERTION OF MESH (N/A Abdomen)     Patient location during evaluation: PACU Anesthesia Type: General Level of consciousness: awake and alert Pain management: pain level controlled Vital Signs Assessment: post-procedure vital signs reviewed and stable Respiratory status: spontaneous breathing, nonlabored ventilation, respiratory function stable and patient connected to nasal cannula oxygen Cardiovascular status: blood pressure returned to baseline and stable Postop Assessment: no apparent nausea or vomiting Anesthetic complications: no    Last Vitals:  Vitals:   07/13/18 1145 07/13/18 1215  BP: 132/77 (!) 156/85  Pulse: 83 83  Resp: 16 18  Temp:  36.7 C  SpO2: 96% 95%    Last Pain:  Vitals:   07/13/18 1215  TempSrc:   PainSc: 0-No pain                 Shelton Silvas

## 2018-07-13 NOTE — Anesthesia Preprocedure Evaluation (Addendum)
Anesthesia Evaluation  Patient identified by MRN, date of birth, ID band Patient awake    Reviewed: Allergy & Precautions, NPO status , Patient's Chart, lab work & pertinent test results  Airway Mallampati: I  TM Distance: >3 FB Neck ROM: Full    Dental  (+) Edentulous Upper, Missing, Dental Advisory Given,    Pulmonary COPD,  COPD inhaler, Current Smoker,     + decreased breath sounds      Cardiovascular hypertension, Pt. on medications + dysrhythmias  Rhythm:Regular Rate:Normal     Neuro/Psych    GI/Hepatic GERD  ,  Endo/Other    Renal/GU      Musculoskeletal   Abdominal Normal abdominal exam  (+)   Peds  Hematology   Anesthesia Other Findings   Reproductive/Obstetrics                            Lab Results  Component Value Date   WBC 6.9 07/07/2018   HGB 16.7 07/07/2018   HCT 49.7 07/07/2018   MCV 90.5 07/07/2018   PLT 162 07/07/2018   Lab Results  Component Value Date   CREATININE 0.79 07/07/2018   BUN 5 (L) 07/07/2018   NA 136 07/07/2018   K 4.2 07/07/2018   CL 98 07/07/2018   CO2 29 07/07/2018   No results found for: INR, PROTIME  EKG: normal sinus rhythm.  Anesthesia Physical Anesthesia Plan  ASA: III  Anesthesia Plan: General   Post-op Pain Management:    Induction: Intravenous  PONV Risk Score and Plan: 2 and Ondansetron and Dexamethasone  Airway Management Planned: Oral ETT and LMA  Additional Equipment: None  Intra-op Plan:   Post-operative Plan: Extubation in OR  Informed Consent: I have reviewed the patients History and Physical, chart, labs and discussed the procedure including the risks, benefits and alternatives for the proposed anesthesia with the patient or authorized representative who has indicated his/her understanding and acceptance.   Dental advisory given  Plan Discussed with: CRNA  Anesthesia Plan Comments:       Anesthesia  Quick Evaluation

## 2018-07-13 NOTE — Transfer of Care (Signed)
Immediate Anesthesia Transfer of Care Note  Patient: Terry Garcia  Procedure(s) Performed: UMBILICAL HERNIA REPAIR WITH MESH (N/A Abdomen) INSERTION OF MESH (N/A Abdomen)  Patient Location: PACU  Anesthesia Type:General  Level of Consciousness: awake, alert  and oriented  Airway & Oxygen Therapy: Patient Spontanous Breathing and Patient connected to face mask oxygen  Post-op Assessment: Report given to RN and Post -op Vital signs reviewed and stable  Post vital signs: Reviewed and stable  Last Vitals:  Vitals Value Taken Time  BP    Temp    Pulse    Resp    SpO2      Last Pain:  Vitals:   07/13/18 0822  TempSrc: Oral  PainSc: 0-No pain         Complications: No apparent anesthesia complications

## 2018-07-13 NOTE — Discharge Instructions (Signed)
[facility] _______[facility] ° °UMBILICAL OR INGUINAL HERNIA REPAIR: POST OP INSTRUCTIONS ° °Always review your discharge instruction sheet given to you by the facility where your surgery was performed. °IF YOU HAVE DISABILITY OR FAMILY LEAVE FORMS, YOU MUST BRING THEM TO THE OFFICE FOR PROCESSING.   °DO NOT GIVE THEM TO YOUR DOCTOR. ° °1. A  prescription for pain medication may be given to you upon discharge.  Take your pain medication as prescribed, if needed.  If narcotic pain medicine is not needed, then you may take acetaminophen (Tylenol) or ibuprofen (Advil) as needed. °2. Take your usually prescribed medications unless otherwise directed. °If you need a refill on your pain medication, please contact your pharmacy.  They will contact our office to request authorization. Prescriptions will not be filled after 5 pm or on week-ends. °3. You should follow a light diet the first 24 hours after arrival home, such as soup and crackers, etc.  Be sure to include lots of fluids daily.  Resume your normal diet the day after surgery. °4.Most patients will experience some swelling and bruising around the umbilicus or in the groin and scrotum.  Ice packs and reclining will help.  Swelling and bruising can take several days to resolve.  °6. It is common to experience some constipation if taking pain medication after surgery.  Increasing fluid intake and taking a stool softener (such as Colace) will usually help or prevent this problem from occurring.  A mild laxative (Milk of Magnesia or Miralax) should be taken according to package directions if there are no bowel movements after 48 hours. °7. Unless discharge instructions indicate otherwise, you may remove your bandages 24-48 hours after surgery, and you may shower at that time.  You may have steri-strips (small skin tapes) in place directly over the incision.  These strips should be left on the skin for 7-10 days.  If your surgeon used skin glue on the  incision, you may shower in 24 hours.  The glue will flake off over the next 2-3 weeks.  Any sutures or staples will be removed at the office during your follow-up visit. °8. ACTIVITIES:  You may resume regular (light) daily activities beginning the next day--such as daily self-care, walking, climbing stairs--gradually increasing activities as tolerated.  You may have sexual intercourse when it is comfortable.  Refrain from any heavy lifting or straining until approved by your doctor. ° °a.You may drive when you are no longer taking prescription pain medication, you can comfortably wear a seatbelt, and you can safely maneuver your car and apply brakes. °b.RETURN TO WORK:   °_____________________________________________ ° °9.You should see your doctor in the office for a follow-up appointment approximately 2-3 weeks after your surgery.  Make sure that you call for this appointment within a day or two after you arrive home to insure a convenient appointment time. °10.OTHER INSTRUCTIONS: _________________________ °   _____________________________________ ° °WHEN TO CALL YOUR DOCTOR: °1. Fever over 101.0 °2. Inability to urinate °3. Nausea and/or vomiting °4. Extreme swelling or bruising °5. Continued bleeding from incision. °6. Increased pain, redness, or drainage from the incision ° °The clinic staff is available to answer your questions during regular business hours.  Please don’t hesitate to call and ask to speak to one of the nurses for clinical concerns.  If you have a medical emergency, go to the nearest emergency room or call 911.  A surgeon from [facility] is always on call at the hospital ° ° °  [address], [address], [place], [place]  [address] ?  [address] [address], [place], [place]   [address] [phone] ? [phone] ? FAX [phone] Web site: [email or web address]    Post Anesthesia Home Care Instructions  NO TYLENOL UNTIL AFTER 2:45PM ON 07/13/18  Activity: Get  plenty of rest for the remainder of the day. A responsible individual must stay with you for 24 hours following the procedure.  For the next 24 hours, DO NOT: -Drive a car -Advertising copywriter -Drink alcoholic beverages -Take any medication unless instructed by your physician -Make any legal decisions or sign important papers.  Meals: Start with liquid foods such as gelatin or soup. Progress to regular foods as tolerated. Avoid greasy, spicy, heavy foods. If nausea and/or vomiting occur, drink only clear liquids until the nausea and/or vomiting subsides. Call your physician if vomiting continues.  Special Instructions/Symptoms: Your throat may feel dry or sore from the anesthesia or the breathing tube placed in your throat during surgery. If this causes discomfort, gargle with warm salt water. The discomfort should disappear within 24 hours.  If you had a scopolamine patch placed behind your ear for the management of post- operative nausea and/or vomiting:  1. The medication in the patch is effective for 72 hours, after which it should be removed.  Wrap patch in a tissue and discard in the trash. Wash hands thoroughly with soap and water. 2. You may remove the patch earlier than 72 hours if you experience unpleasant side effects which may include dry mouth, dizziness or visual disturbances. 3. Avoid touching the patch. Wash your hands with soap and water after contact with the patch.

## 2018-07-13 NOTE — Anesthesia Procedure Notes (Signed)
Procedure Name: Intubation Date/Time: 07/13/2018 10:10 AM Performed by: Jonna Munro, CRNA Pre-anesthesia Checklist: Patient identified, Emergency Drugs available, Suction available, Patient being monitored and Timeout performed Patient Re-evaluated:Patient Re-evaluated prior to induction Oxygen Delivery Method: Circle system utilized Preoxygenation: Pre-oxygenation with 100% oxygen Induction Type: IV induction Ventilation: Mask ventilation without difficulty Laryngoscope Size: Mac and 3 Grade View: Grade I Tube type: Oral Tube size: 7.0 mm Number of attempts: 1 Airway Equipment and Method: Stylet Placement Confirmation: ETT inserted through vocal cords under direct vision,  positive ETCO2 and breath sounds checked- equal and bilateral Secured at: 22 cm Tube secured with: Tape Dental Injury: Teeth and Oropharynx as per pre-operative assessment

## 2018-07-14 ENCOUNTER — Encounter (HOSPITAL_BASED_OUTPATIENT_CLINIC_OR_DEPARTMENT_OTHER): Payer: Self-pay | Admitting: Surgery

## 2018-08-02 DIAGNOSIS — D0461 Carcinoma in situ of skin of right upper limb, including shoulder: Secondary | ICD-10-CM | POA: Diagnosis not present

## 2018-11-14 DIAGNOSIS — R7309 Other abnormal glucose: Secondary | ICD-10-CM | POA: Diagnosis not present

## 2018-11-14 DIAGNOSIS — D692 Other nonthrombocytopenic purpura: Secondary | ICD-10-CM | POA: Diagnosis not present

## 2018-11-14 DIAGNOSIS — J449 Chronic obstructive pulmonary disease, unspecified: Secondary | ICD-10-CM | POA: Diagnosis not present

## 2018-11-14 DIAGNOSIS — N181 Chronic kidney disease, stage 1: Secondary | ICD-10-CM | POA: Diagnosis not present

## 2019-05-17 IMAGING — US US EXTREM LOW VENOUS*R*
1 series · 14 of 24 positions shown · non-contrast
Comparison: None.

CLINICAL DATA: Right leg edema and erythema of the calf for 1 week.

EXAM:
RIGHT LOWER EXTREMITY VENOUS DUPLEX ULTRASOUND
TECHNIQUE: Doppler venous assessment of the right lower extremity deep venous
system was performed, including characterization of spectral flow,
compressibility, and phasicity.

[Series 1: us extrem low venous*right* · 0.07mm/px · 14 of 37 slices shown]
[im 1/37]
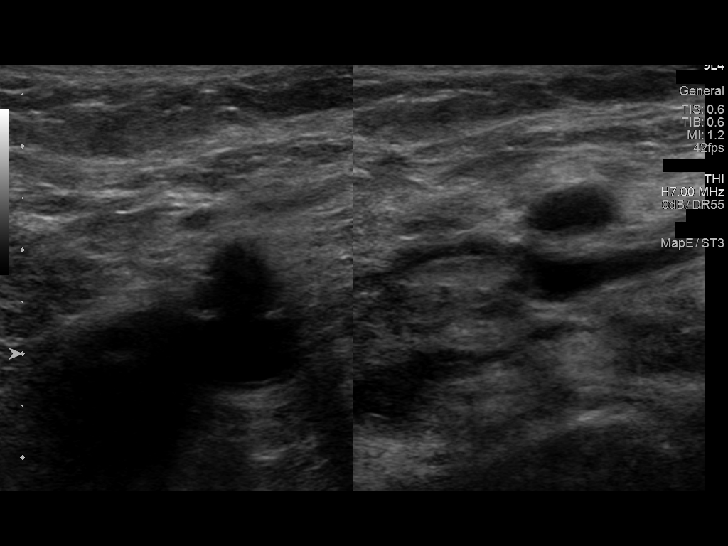
[im 4/37]
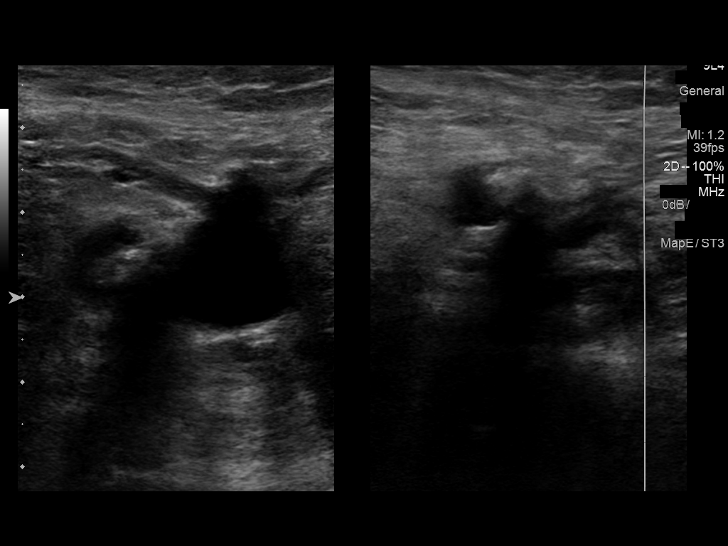
[im 7/37]
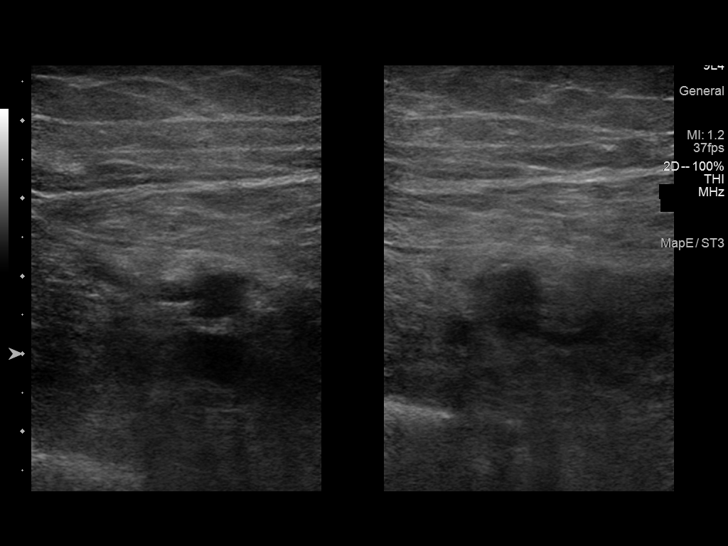
[im 10/37]
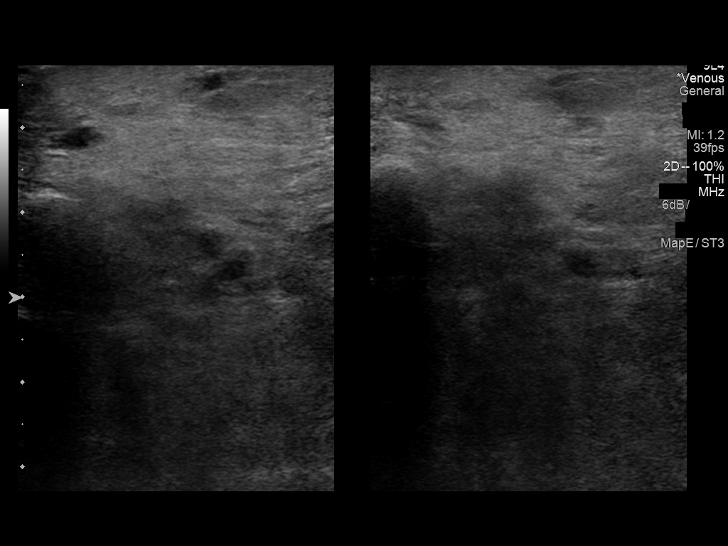
[im 11/37]
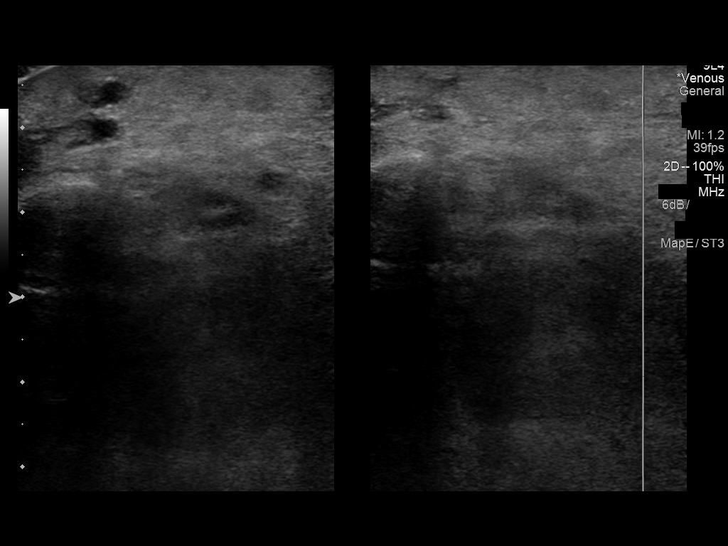
[im 15/37]
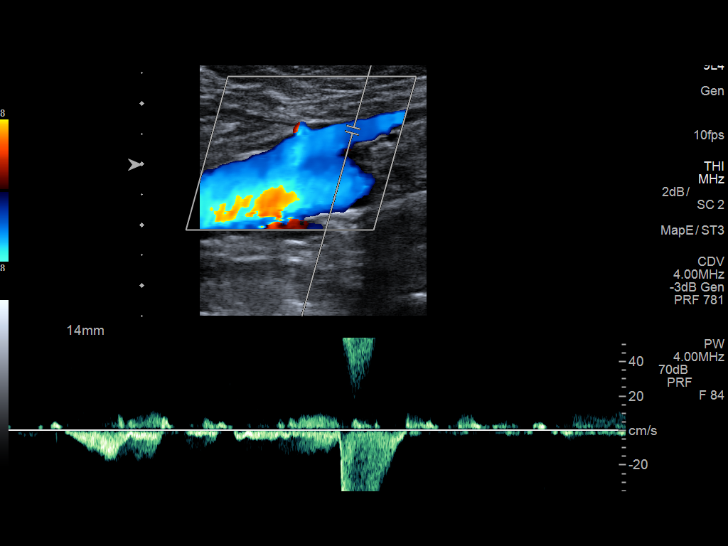
[im 18/37]
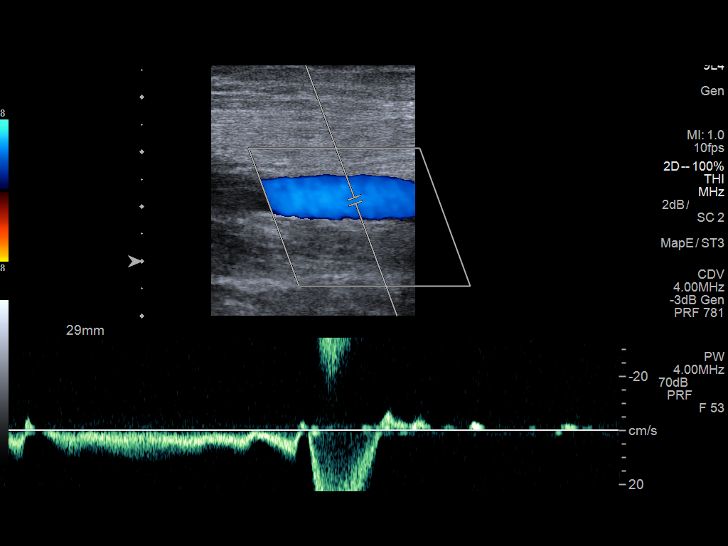
[im 19/37]
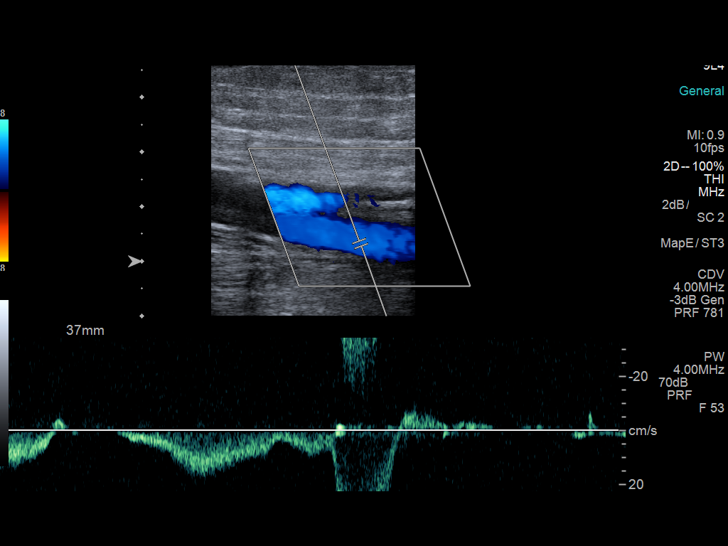
[im 22/37]
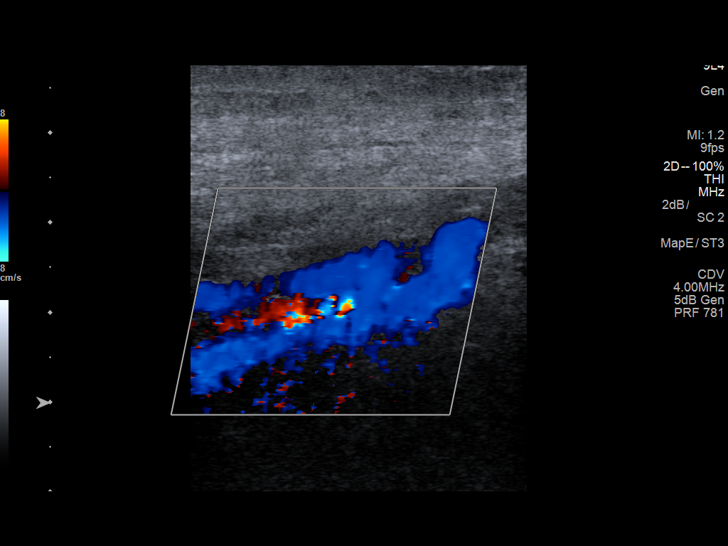
[im 26/37]
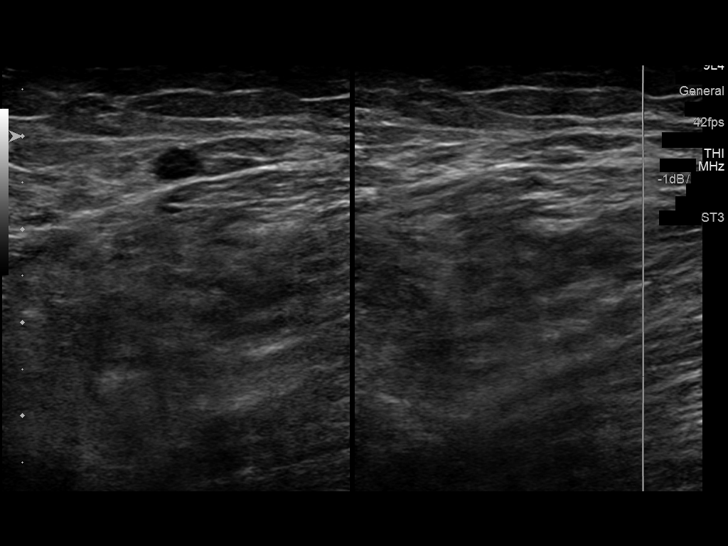
[im 29/37]
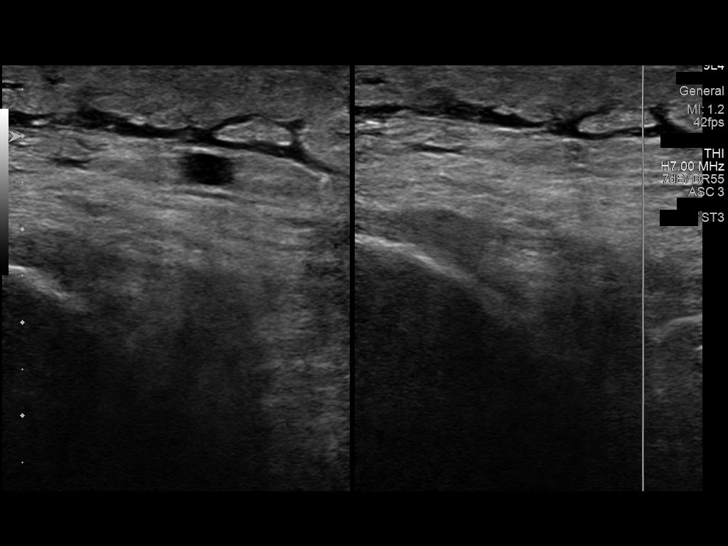
[im 30/37]
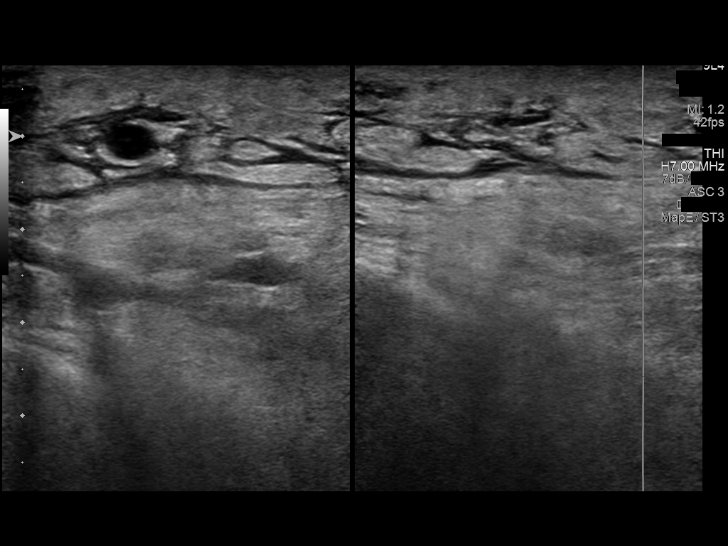
[im 33/37]
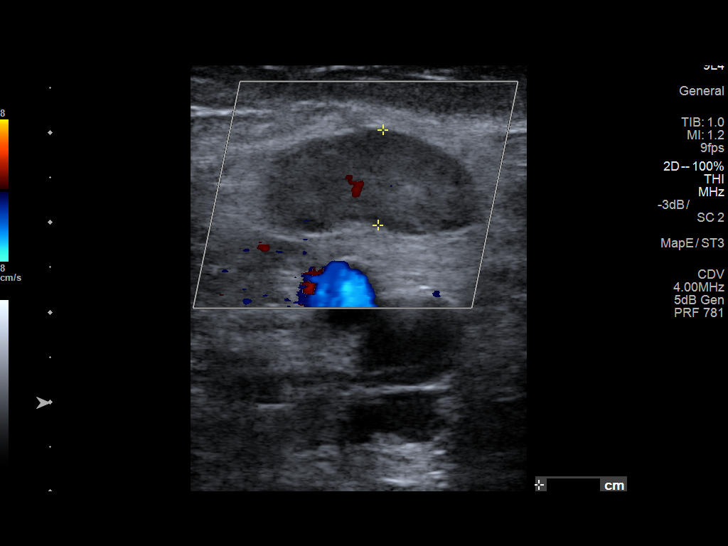
[im 37/37]
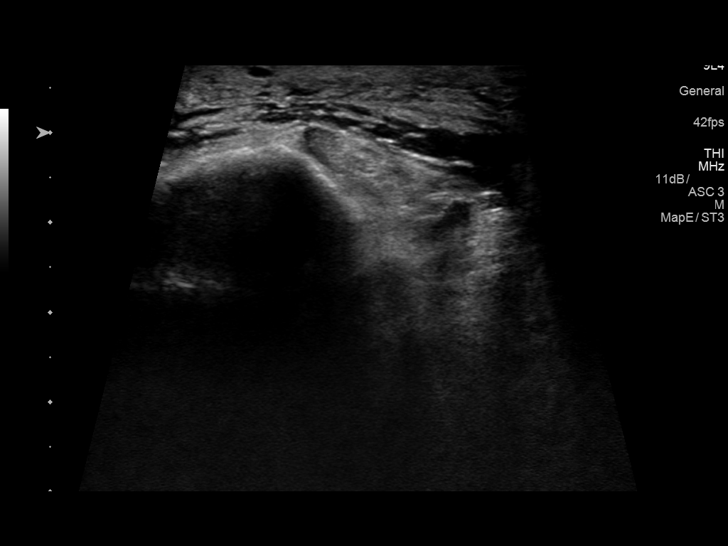

[14 of 24 positions shown; findings below may reference images not displayed]

FINDINGS: There is complete compressibility of the common femoral, femoral,
and popliteal veins. Doppler analysis demonstrates respiratory
phasicity and augmentation of flow with calf compression. No obvious
superficial vein or calf vein thrombosis. Subcutaneous edema in the
calf is noted.
IMPRESSION: No evidence of right lower extremity DVT.

## 2019-05-19 DIAGNOSIS — Z125 Encounter for screening for malignant neoplasm of prostate: Secondary | ICD-10-CM | POA: Diagnosis not present

## 2019-05-19 DIAGNOSIS — R7309 Other abnormal glucose: Secondary | ICD-10-CM | POA: Diagnosis not present

## 2019-05-19 DIAGNOSIS — Z1211 Encounter for screening for malignant neoplasm of colon: Secondary | ICD-10-CM | POA: Diagnosis not present

## 2019-05-19 DIAGNOSIS — I10 Essential (primary) hypertension: Secondary | ICD-10-CM | POA: Diagnosis not present

## 2019-05-22 DIAGNOSIS — Z0000 Encounter for general adult medical examination without abnormal findings: Secondary | ICD-10-CM | POA: Diagnosis not present

## 2019-05-22 DIAGNOSIS — I10 Essential (primary) hypertension: Secondary | ICD-10-CM | POA: Diagnosis not present

## 2019-05-22 DIAGNOSIS — I700 Atherosclerosis of aorta: Secondary | ICD-10-CM | POA: Diagnosis not present

## 2019-05-22 DIAGNOSIS — J449 Chronic obstructive pulmonary disease, unspecified: Secondary | ICD-10-CM | POA: Diagnosis not present

## 2019-11-20 DIAGNOSIS — R7309 Other abnormal glucose: Secondary | ICD-10-CM | POA: Diagnosis not present

## 2019-11-20 DIAGNOSIS — J449 Chronic obstructive pulmonary disease, unspecified: Secondary | ICD-10-CM | POA: Diagnosis not present

## 2019-11-20 DIAGNOSIS — N181 Chronic kidney disease, stage 1: Secondary | ICD-10-CM | POA: Diagnosis not present

## 2019-11-20 DIAGNOSIS — F17200 Nicotine dependence, unspecified, uncomplicated: Secondary | ICD-10-CM | POA: Diagnosis not present

## 2020-02-26 DIAGNOSIS — Z1211 Encounter for screening for malignant neoplasm of colon: Secondary | ICD-10-CM | POA: Diagnosis not present

## 2020-07-17 DIAGNOSIS — I10 Essential (primary) hypertension: Secondary | ICD-10-CM | POA: Diagnosis not present

## 2020-07-17 DIAGNOSIS — J449 Chronic obstructive pulmonary disease, unspecified: Secondary | ICD-10-CM | POA: Diagnosis not present

## 2020-07-17 DIAGNOSIS — Z0000 Encounter for general adult medical examination without abnormal findings: Secondary | ICD-10-CM | POA: Diagnosis not present

## 2020-07-17 DIAGNOSIS — I700 Atherosclerosis of aorta: Secondary | ICD-10-CM | POA: Diagnosis not present

## 2021-01-15 DIAGNOSIS — F17200 Nicotine dependence, unspecified, uncomplicated: Secondary | ICD-10-CM | POA: Diagnosis not present

## 2021-01-15 DIAGNOSIS — I10 Essential (primary) hypertension: Secondary | ICD-10-CM | POA: Diagnosis not present

## 2021-01-15 DIAGNOSIS — J449 Chronic obstructive pulmonary disease, unspecified: Secondary | ICD-10-CM | POA: Diagnosis not present

## 2021-01-15 DIAGNOSIS — R7309 Other abnormal glucose: Secondary | ICD-10-CM | POA: Diagnosis not present

## 2021-01-21 ENCOUNTER — Ambulatory Visit
Admission: RE | Admit: 2021-01-21 | Discharge: 2021-01-21 | Disposition: A | Discharge status: Home / Self Care | Payer: Medicare Other | Source: Ambulatory Visit | Attending: Family Medicine | Admitting: Family Medicine

## 2021-01-21 ENCOUNTER — Other Ambulatory Visit: Payer: Self-pay | Admitting: Family Medicine

## 2021-01-21 DIAGNOSIS — E871 Hypo-osmolality and hyponatremia: Secondary | ICD-10-CM | POA: Diagnosis not present

## 2021-01-21 DIAGNOSIS — R918 Other nonspecific abnormal finding of lung field: Secondary | ICD-10-CM | POA: Diagnosis not present

## 2021-07-25 DIAGNOSIS — D692 Other nonthrombocytopenic purpura: Secondary | ICD-10-CM | POA: Diagnosis not present

## 2021-07-25 DIAGNOSIS — J449 Chronic obstructive pulmonary disease, unspecified: Secondary | ICD-10-CM | POA: Diagnosis not present

## 2021-07-25 DIAGNOSIS — I700 Atherosclerosis of aorta: Secondary | ICD-10-CM | POA: Diagnosis not present

## 2021-07-25 DIAGNOSIS — R7309 Other abnormal glucose: Secondary | ICD-10-CM | POA: Diagnosis not present

## 2021-07-25 DIAGNOSIS — Z0000 Encounter for general adult medical examination without abnormal findings: Secondary | ICD-10-CM | POA: Diagnosis not present

## 2021-07-25 DIAGNOSIS — I10 Essential (primary) hypertension: Secondary | ICD-10-CM | POA: Diagnosis not present

## 2021-08-12 DIAGNOSIS — Z1211 Encounter for screening for malignant neoplasm of colon: Secondary | ICD-10-CM | POA: Diagnosis not present

## 2022-09-28 IMAGING — CR DG CHEST 2V
2 series · 2 of 2 positions shown · non-contrast
Comparison: Remote radiograph 08/20/2008

CLINICAL DATA: Hyponatremia.

EXAM:
CHEST - 2 VIEW

[w chest pa]
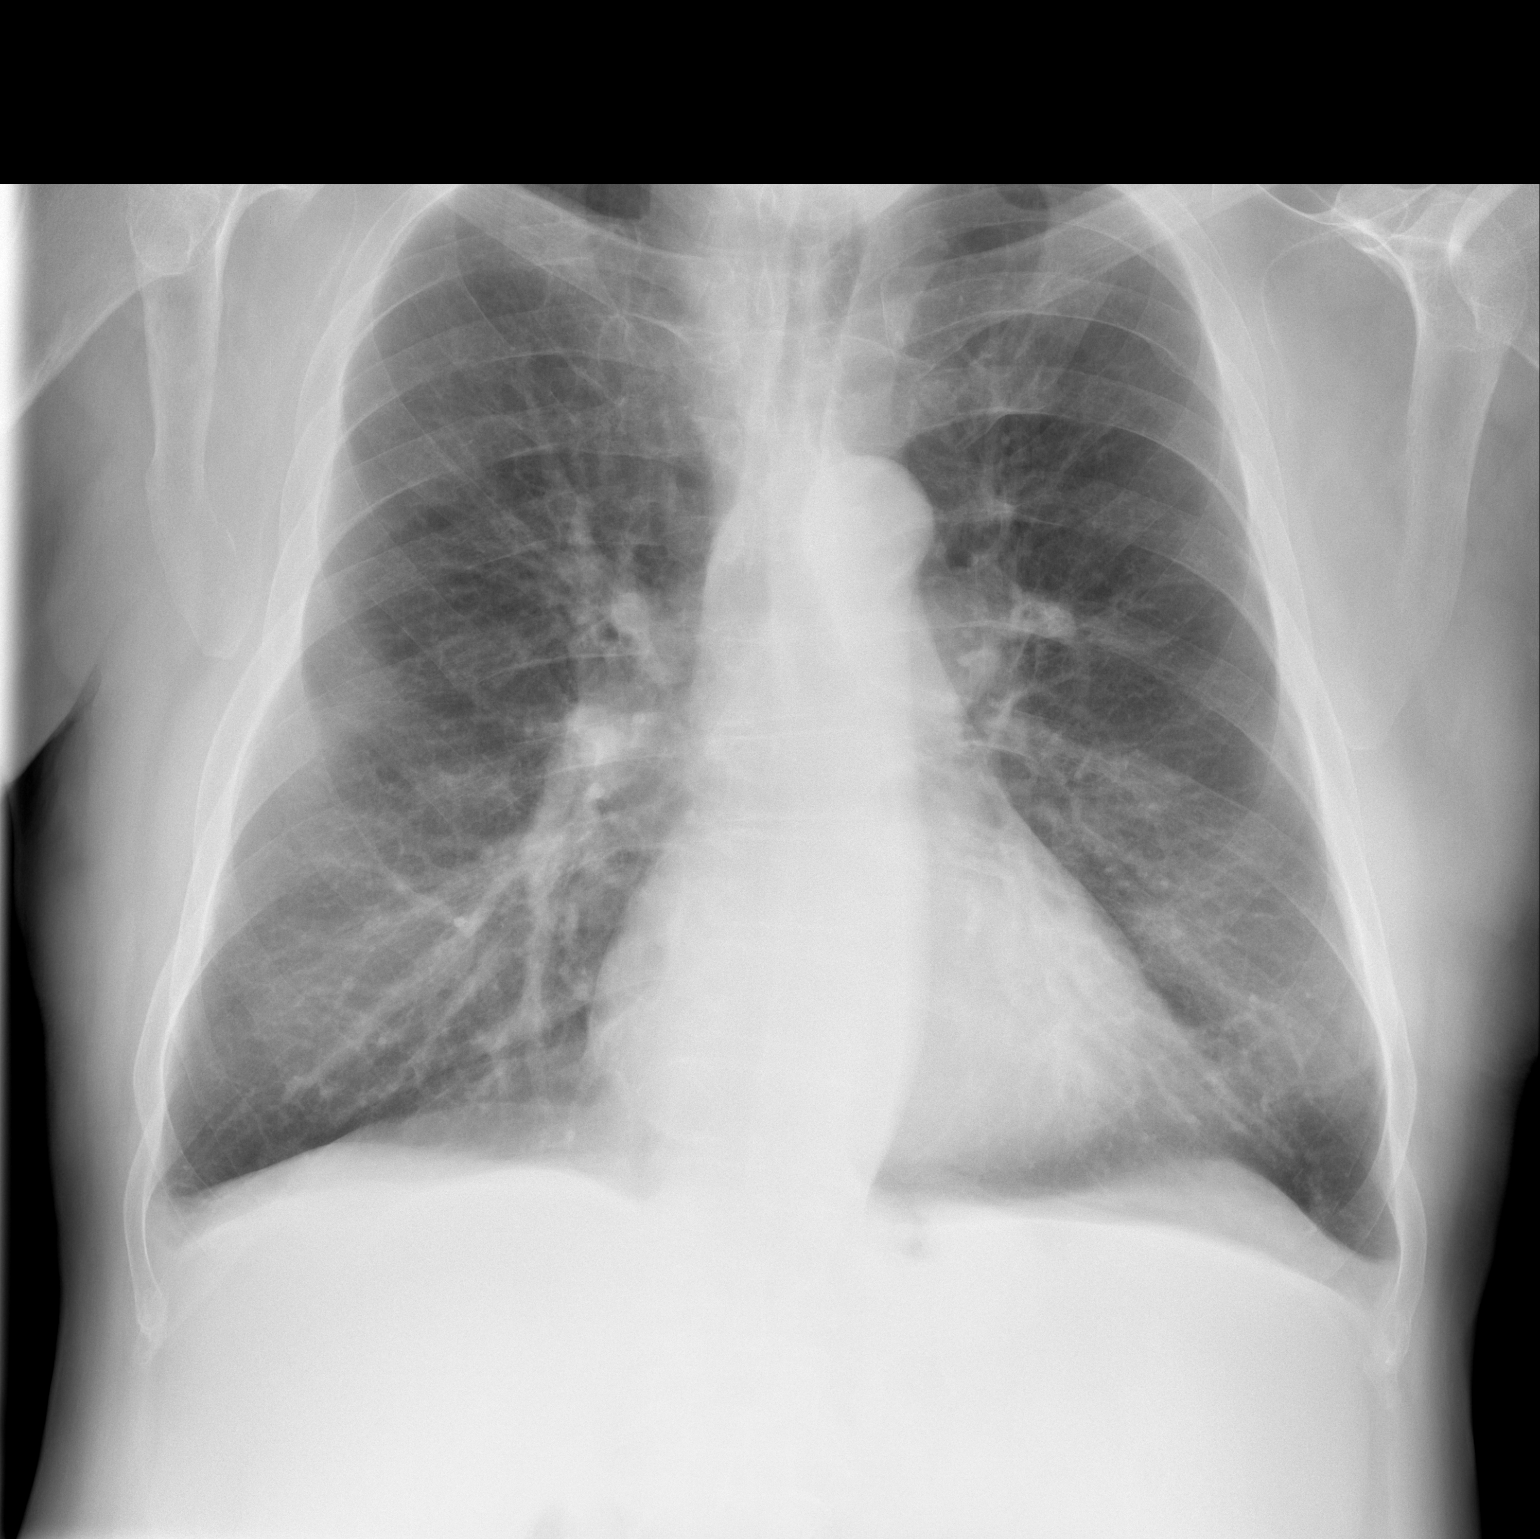

[w chest lat]
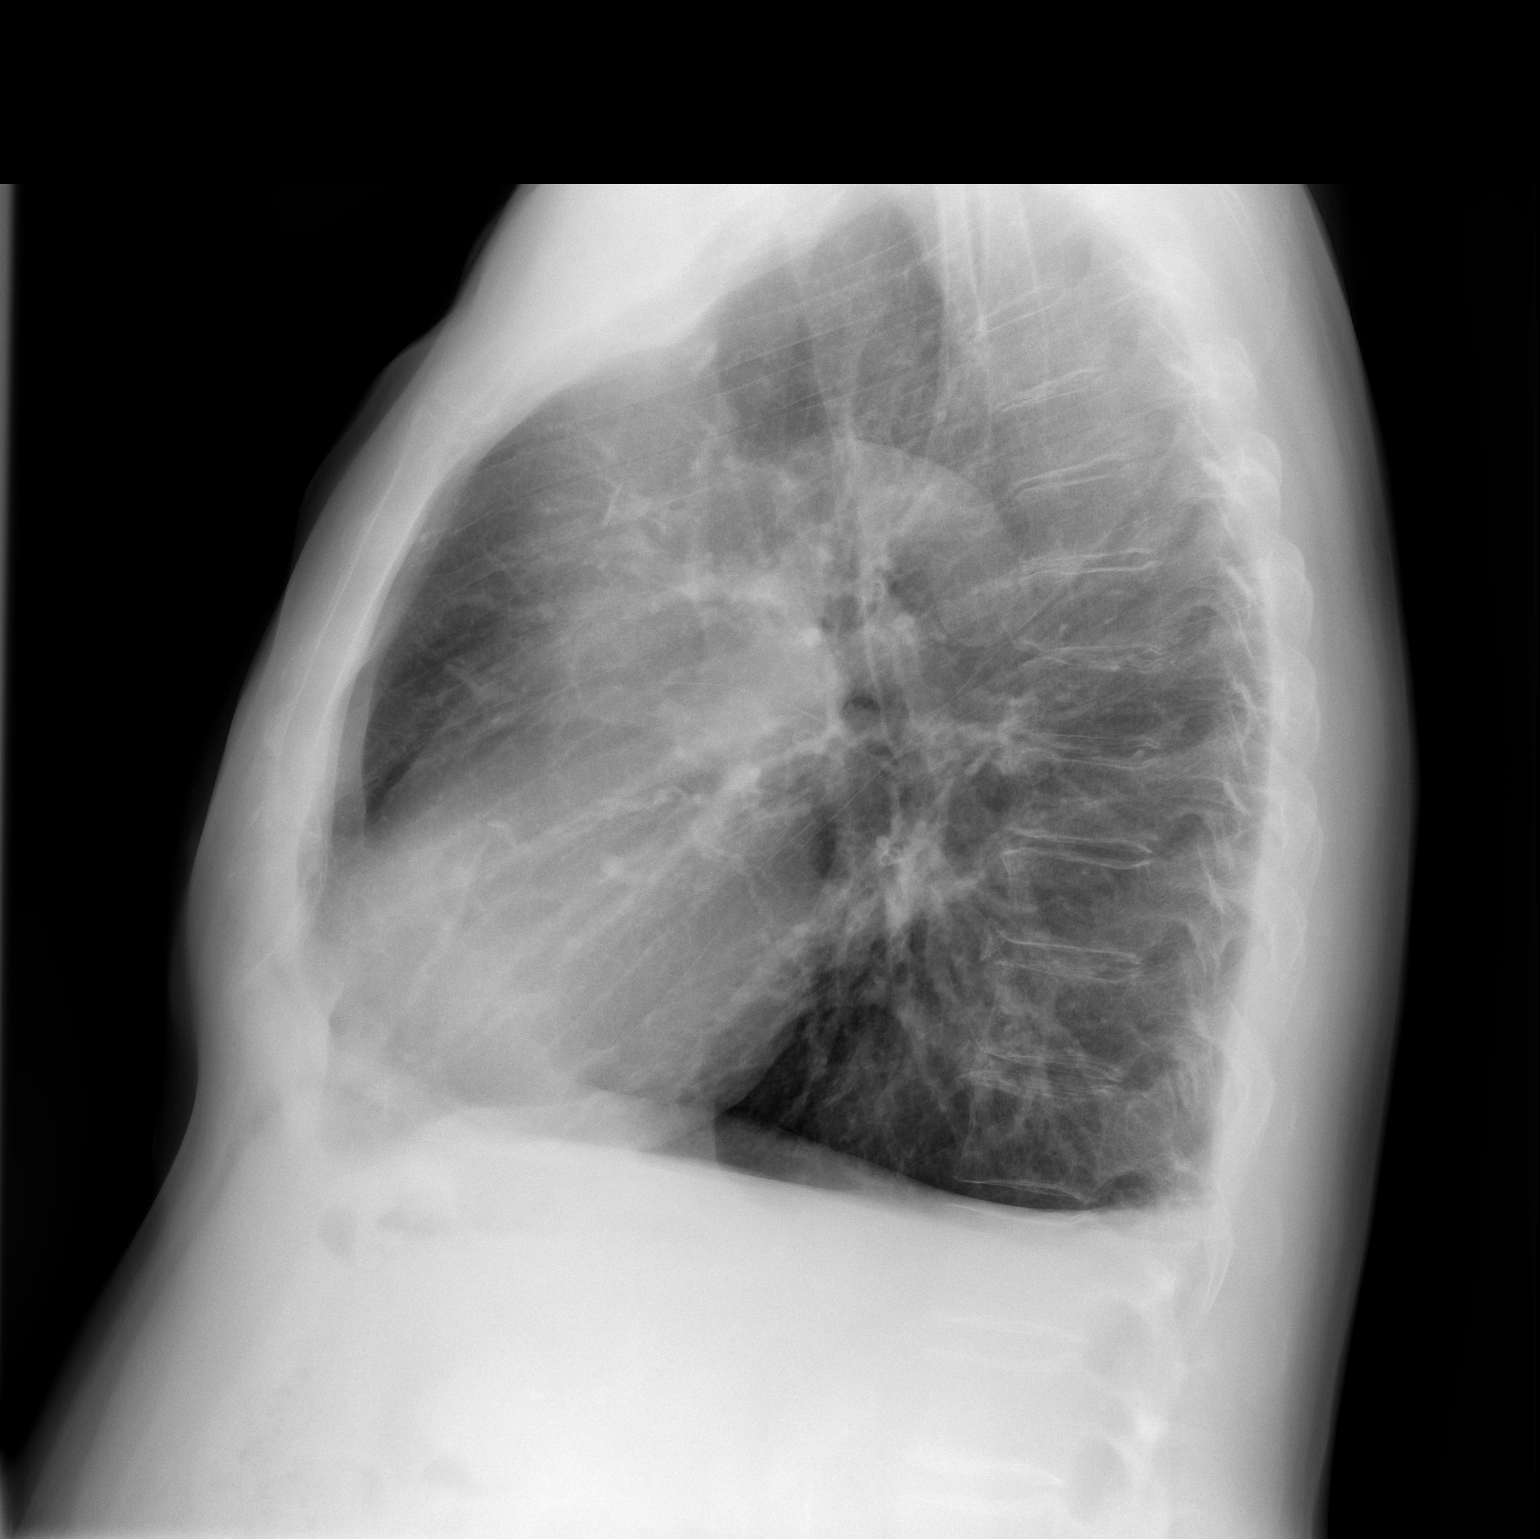

[2 of 2 positions shown; findings below may reference images not displayed]

FINDINGS: The cardiomediastinal contours are normal. Mild hyperinflation.
Blunting of the costophrenic angles likely due to hyperinflation, no
significant pleural fluid. No visualized pulmonary mass. Pulmonary
vasculature is normal. No consolidation. No pneumothorax. No acute
osseous abnormalities are seen.
IMPRESSION: Chronic hyperinflation appears similar to remote prior exam. No
acute findings.

## 2023-08-31 ENCOUNTER — Encounter: Payer: Self-pay | Admitting: *Deleted

## 2024-01-14 ENCOUNTER — Inpatient Hospital Stay
Admission: EM | Admit: 2024-01-14 | Discharge: 2024-01-20 | DRG: 286 | Disposition: A | Discharge status: Home Health | Attending: Internal Medicine | Admitting: Internal Medicine

## 2024-01-14 ENCOUNTER — Other Ambulatory Visit: Payer: Self-pay

## 2024-01-14 ENCOUNTER — Emergency Department

## 2024-01-14 DIAGNOSIS — I5043 Acute on chronic combined systolic (congestive) and diastolic (congestive) heart failure: Secondary | ICD-10-CM | POA: Diagnosis present

## 2024-01-14 DIAGNOSIS — I25118 Atherosclerotic heart disease of native coronary artery with other forms of angina pectoris: Secondary | ICD-10-CM | POA: Diagnosis present

## 2024-01-14 DIAGNOSIS — J9601 Acute respiratory failure with hypoxia: Secondary | ICD-10-CM | POA: Diagnosis present

## 2024-01-14 DIAGNOSIS — S0101XA Laceration without foreign body of scalp, initial encounter: Secondary | ICD-10-CM | POA: Diagnosis present

## 2024-01-14 DIAGNOSIS — Z8249 Family history of ischemic heart disease and other diseases of the circulatory system: Secondary | ICD-10-CM

## 2024-01-14 DIAGNOSIS — I509 Heart failure, unspecified: Secondary | ICD-10-CM | POA: Diagnosis not present

## 2024-01-14 DIAGNOSIS — I4510 Unspecified right bundle-branch block: Secondary | ICD-10-CM | POA: Diagnosis present

## 2024-01-14 DIAGNOSIS — R0601 Orthopnea: Secondary | ICD-10-CM

## 2024-01-14 DIAGNOSIS — I4891 Unspecified atrial fibrillation: Secondary | ICD-10-CM

## 2024-01-14 DIAGNOSIS — Z79899 Other long term (current) drug therapy: Secondary | ICD-10-CM

## 2024-01-14 DIAGNOSIS — Y92009 Unspecified place in unspecified non-institutional (private) residence as the place of occurrence of the external cause: Secondary | ICD-10-CM

## 2024-01-14 DIAGNOSIS — I4819 Other persistent atrial fibrillation: Secondary | ICD-10-CM | POA: Diagnosis present

## 2024-01-14 DIAGNOSIS — I110 Hypertensive heart disease with heart failure: Secondary | ICD-10-CM | POA: Diagnosis not present

## 2024-01-14 DIAGNOSIS — S0990XA Unspecified injury of head, initial encounter: Secondary | ICD-10-CM

## 2024-01-14 DIAGNOSIS — F10932 Alcohol use, unspecified with withdrawal with perceptual disturbance: Secondary | ICD-10-CM | POA: Diagnosis not present

## 2024-01-14 DIAGNOSIS — W19XXXA Unspecified fall, initial encounter: Principal | ICD-10-CM

## 2024-01-14 DIAGNOSIS — T148XXA Other injury of unspecified body region, initial encounter: Secondary | ICD-10-CM

## 2024-01-14 DIAGNOSIS — I255 Ischemic cardiomyopathy: Secondary | ICD-10-CM | POA: Diagnosis present

## 2024-01-14 DIAGNOSIS — R54 Age-related physical debility: Secondary | ICD-10-CM | POA: Diagnosis present

## 2024-01-14 DIAGNOSIS — Z1152 Encounter for screening for COVID-19: Secondary | ICD-10-CM

## 2024-01-14 DIAGNOSIS — F10939 Alcohol use, unspecified with withdrawal, unspecified: Secondary | ICD-10-CM | POA: Diagnosis present

## 2024-01-14 DIAGNOSIS — I5021 Acute systolic (congestive) heart failure: Secondary | ICD-10-CM

## 2024-01-14 DIAGNOSIS — S51812A Laceration without foreign body of left forearm, initial encounter: Secondary | ICD-10-CM

## 2024-01-14 DIAGNOSIS — W07XXXA Fall from chair, initial encounter: Secondary | ICD-10-CM | POA: Diagnosis present

## 2024-01-14 DIAGNOSIS — I420 Dilated cardiomyopathy: Secondary | ICD-10-CM | POA: Diagnosis present

## 2024-01-14 DIAGNOSIS — J441 Chronic obstructive pulmonary disease with (acute) exacerbation: Secondary | ICD-10-CM | POA: Diagnosis present

## 2024-01-14 DIAGNOSIS — F1090 Alcohol use, unspecified, uncomplicated: Secondary | ICD-10-CM

## 2024-01-14 DIAGNOSIS — S50312A Abrasion of left elbow, initial encounter: Secondary | ICD-10-CM | POA: Diagnosis present

## 2024-01-14 DIAGNOSIS — R000 Tachycardia, unspecified: Secondary | ICD-10-CM | POA: Diagnosis present

## 2024-01-14 DIAGNOSIS — R0609 Other forms of dyspnea: Secondary | ICD-10-CM

## 2024-01-14 DIAGNOSIS — Z7901 Long term (current) use of anticoagulants: Secondary | ICD-10-CM

## 2024-01-14 DIAGNOSIS — F10139 Alcohol abuse with withdrawal, unspecified: Secondary | ICD-10-CM | POA: Diagnosis present

## 2024-01-14 DIAGNOSIS — F17210 Nicotine dependence, cigarettes, uncomplicated: Secondary | ICD-10-CM | POA: Diagnosis present

## 2024-01-14 DIAGNOSIS — Z888 Allergy status to other drugs, medicaments and biological substances status: Secondary | ICD-10-CM

## 2024-01-14 DIAGNOSIS — I959 Hypotension, unspecified: Secondary | ICD-10-CM | POA: Diagnosis present

## 2024-01-14 DIAGNOSIS — Z881 Allergy status to other antibiotic agents status: Secondary | ICD-10-CM

## 2024-01-14 LAB — CBC
HCT: 47 % (ref 39.0–52.0)
Hemoglobin: 15.7 g/dL (ref 13.0–17.0)
MCH: 30.3 pg (ref 26.0–34.0)
MCHC: 33.4 g/dL (ref 30.0–36.0)
MCV: 90.7 fL (ref 80.0–100.0)
Platelets: 163 K/uL (ref 150–400)
RBC: 5.18 MIL/uL (ref 4.22–5.81)
RDW: 13.4 % (ref 11.5–15.5)
WBC: 6.1 K/uL (ref 4.0–10.5)
nRBC: 0 % (ref 0.0–0.2)

## 2024-01-14 LAB — URINALYSIS, ROUTINE W REFLEX MICROSCOPIC
Bacteria, UA: NONE SEEN
Bilirubin Urine: NEGATIVE
Glucose, UA: NEGATIVE mg/dL
Hgb urine dipstick: NEGATIVE
Ketones, ur: NEGATIVE mg/dL
Leukocytes,Ua: NEGATIVE
Nitrite: NEGATIVE
Protein, ur: 100 mg/dL — AB
Specific Gravity, Urine: 1.008 (ref 1.005–1.030)
Squamous Epithelial / HPF: 0 /HPF (ref 0–5)
pH: 6 (ref 5.0–8.0)

## 2024-01-14 LAB — COMPREHENSIVE METABOLIC PANEL WITH GFR
ALT: 17 U/L (ref 0–44)
AST: 20 U/L (ref 15–41)
Albumin: 3.9 g/dL (ref 3.5–5.0)
Alkaline Phosphatase: 73 U/L (ref 38–126)
Anion gap: 12 (ref 5–15)
BUN: 8 mg/dL (ref 8–23)
CO2: 30 mmol/L (ref 22–32)
Calcium: 9.2 mg/dL (ref 8.9–10.3)
Chloride: 96 mmol/L — ABNORMAL LOW (ref 98–111)
Creatinine, Ser: 0.93 mg/dL (ref 0.61–1.24)
GFR, Estimated: >60 mL/min (ref >60)
Glucose, Bld: 131 mg/dL — ABNORMAL HIGH (ref 70–99)
Potassium: 4.1 mmol/L (ref 3.5–5.1)
Sodium: 138 mmol/L (ref 135–145)
Total Bilirubin: 1.1 mg/dL (ref 0.0–1.2)
Total Protein: 7.1 g/dL (ref 6.5–8.1)

## 2024-01-14 LAB — RESP PANEL BY RT-PCR (RSV, FLU A&B, COVID)  RVPGX2
Influenza A by PCR: NEGATIVE
Influenza B by PCR: NEGATIVE
Resp Syncytial Virus by PCR: NEGATIVE
SARS Coronavirus 2 by RT PCR: NEGATIVE

## 2024-01-14 LAB — D-DIMER, QUANTITATIVE: D-Dimer, Quant: 1.19 ug{FEU}/mL — ABNORMAL HIGH (ref 0.00–0.50)

## 2024-01-14 LAB — BRAIN NATRIURETIC PEPTIDE: B Natriuretic Peptide: 401 pg/mL — ABNORMAL HIGH (ref 0.0–100.0)

## 2024-01-14 LAB — TROPONIN I (HIGH SENSITIVITY)
Troponin I (High Sensitivity): 25 ng/L — ABNORMAL HIGH (ref <18)
Troponin I (High Sensitivity): 27 ng/L — ABNORMAL HIGH (ref <18)

## 2024-01-14 LAB — LACTIC ACID, PLASMA: Lactic Acid, Venous: 1.4 mmol/L (ref 0.5–1.9)

## 2024-01-14 MED ORDER — THIAMINE MONONITRATE 100 MG PO TABS
100.0000 mg | ORAL_TABLET | Freq: Every day | ORAL | Status: DC
  Administered 2024-01-14 – 2024-01-20 (×7): 100 mg via ORAL
  Filled 2024-01-14 (×7): qty 1

## 2024-01-14 MED ORDER — LORAZEPAM 2 MG PO TABS
2.0000 mg | ORAL_TABLET | Freq: Four times a day (QID) | ORAL | Status: DC
  Administered 2024-01-14 – 2024-01-15 (×5): 2 mg via ORAL
  Filled 2024-01-14: qty 1
  Filled 2024-01-14: qty 2
  Filled 2024-01-14 (×2): qty 1
  Filled 2024-01-14: qty 2

## 2024-01-14 MED ORDER — UMECLIDINIUM BROMIDE 62.5 MCG/ACT IN AEPB
1.0000 | INHALATION_SPRAY | Freq: Every day | RESPIRATORY_TRACT | Status: DC
  Administered 2024-01-15 – 2024-01-20 (×5): 1 via RESPIRATORY_TRACT
  Filled 2024-01-14: qty 7

## 2024-01-14 MED ORDER — ONDANSETRON HCL 4 MG PO TABS: 4.0000 mg | ORAL_TABLET | Freq: Four times a day (QID) | ORAL | Status: DC | PRN

## 2024-01-14 MED ORDER — FOLIC ACID 1 MG PO TABS
1.0000 mg | ORAL_TABLET | Freq: Every day | ORAL | Status: DC
  Administered 2024-01-14 – 2024-01-20 (×7): 1 mg via ORAL
  Filled 2024-01-14 (×7): qty 1

## 2024-01-14 MED ORDER — AMLODIPINE-OLMESARTAN 10-20 MG PO TABS: 1.0000 | ORAL_TABLET | Freq: Every day | ORAL | Status: DC

## 2024-01-14 MED ORDER — SODIUM CHLORIDE 0.9 % IV BOLUS: 1000.0000 mL | Freq: Once | INTRAVENOUS | Status: DC

## 2024-01-14 MED ORDER — LORAZEPAM 1 MG PO TABS: 1.0000 mg | ORAL_TABLET | ORAL | Status: AC | PRN

## 2024-01-14 MED ORDER — NICOTINE POLACRILEX 4 MG MT LOZG: 4.0000 mg | LOZENGE | OROMUCOSAL | 0 refills | Status: DC | PRN

## 2024-01-14 MED ORDER — HEPARIN SODIUM (PORCINE) 5000 UNIT/ML IJ SOLN
5000.0000 [IU] | Freq: Two times a day (BID) | INTRAMUSCULAR | Status: DC
  Administered 2024-01-14 – 2024-01-17 (×6): 5000 [IU] via SUBCUTANEOUS
  Filled 2024-01-14 (×6): qty 1

## 2024-01-14 MED ORDER — SENNOSIDES-DOCUSATE SODIUM 8.6-50 MG PO TABS
1.0000 | ORAL_TABLET | Freq: Every evening | ORAL | Status: DC | PRN
  Administered 2024-01-18: 1 via ORAL
  Filled 2024-01-14: qty 1

## 2024-01-14 MED ORDER — ONDANSETRON HCL 4 MG/2ML IJ SOLN
4.0000 mg | Freq: Four times a day (QID) | INTRAMUSCULAR | Status: DC | PRN
Start: 2024-01-14 — End: 2024-01-20

## 2024-01-14 MED ORDER — ADULT MULTIVITAMIN W/MINERALS CH
1.0000 | ORAL_TABLET | Freq: Every day | ORAL | Status: DC
  Administered 2024-01-14 – 2024-01-20 (×7): 1 via ORAL
  Filled 2024-01-14 (×7): qty 1

## 2024-01-14 MED ORDER — NICOTINE 14 MG/24HR TD PT24
14.0000 mg | MEDICATED_PATCH | Freq: Once | TRANSDERMAL | Status: AC
  Administered 2024-01-14: 14 mg via TRANSDERMAL
  Filled 2024-01-14: qty 1

## 2024-01-14 MED ORDER — ARFORMOTEROL TARTRATE 15 MCG/2ML IN NEBU
15.0000 ug | INHALATION_SOLUTION | Freq: Two times a day (BID) | RESPIRATORY_TRACT | Status: DC
  Administered 2024-01-14 – 2024-01-19 (×10): 15 ug via RESPIRATORY_TRACT
  Filled 2024-01-14 (×14): qty 2

## 2024-01-14 MED ORDER — IOHEXOL 350 MG/ML SOLN
100.0000 mL | Freq: Once | INTRAVENOUS | Status: AC | PRN
  Administered 2024-01-14: 100 mL via INTRAVENOUS

## 2024-01-14 MED ORDER — FUROSEMIDE 10 MG/ML IJ SOLN
40.0000 mg | Freq: Once | INTRAMUSCULAR | Status: AC
  Administered 2024-01-14: 40 mg via INTRAVENOUS
  Filled 2024-01-14: qty 4

## 2024-01-14 MED ORDER — LORAZEPAM 2 MG PO TABS
2.0000 mg | ORAL_TABLET | Freq: Once | ORAL | Status: AC
  Administered 2024-01-14: 2 mg via ORAL
  Filled 2024-01-14: qty 1

## 2024-01-14 MED ORDER — IRBESARTAN 150 MG PO TABS
150.0000 mg | ORAL_TABLET | Freq: Every day | ORAL | Status: DC
  Administered 2024-01-14 – 2024-01-17 (×4): 150 mg via ORAL
  Filled 2024-01-14 (×4): qty 1

## 2024-01-14 MED ORDER — RIVAROXABAN 10 MG PO TABS: 10.0000 mg | ORAL_TABLET | Freq: Every day | ORAL | Status: DC

## 2024-01-14 MED ORDER — FUROSEMIDE 10 MG/ML IJ SOLN
40.0000 mg | Freq: Two times a day (BID) | INTRAMUSCULAR | Status: DC
  Administered 2024-01-14 – 2024-01-17 (×5): 40 mg via INTRAVENOUS
  Filled 2024-01-14 (×5): qty 4

## 2024-01-14 MED ORDER — FUROSEMIDE 10 MG/ML IJ SOLN: 20.0000 mg | Freq: Two times a day (BID) | INTRAMUSCULAR | Status: DC

## 2024-01-14 MED ORDER — NICOTINE 14 MG/24HR TD PT24: 14.0000 mg | MEDICATED_PATCH | TRANSDERMAL | 0 refills | Status: DC

## 2024-01-14 MED ORDER — THIAMINE HCL 100 MG/ML IJ SOLN: 100.0000 mg | Freq: Every day | INTRAMUSCULAR | Status: DC

## 2024-01-14 MED ORDER — AMLODIPINE BESYLATE 10 MG PO TABS
10.0000 mg | ORAL_TABLET | Freq: Every day | ORAL | Status: DC
  Administered 2024-01-14 – 2024-01-17 (×4): 10 mg via ORAL
  Filled 2024-01-14: qty 1
  Filled 2024-01-14 (×2): qty 2
  Filled 2024-01-14: qty 1

## 2024-01-14 NOTE — H&P (Signed)
 History and Physical    Patient: Terry Garcia FMW:[id number] DOB: 01/01/1946 DOA: 01/14/2024 DOS: the patient was seen and examined on 01/14/2024 PCP: Loreli Kins, MD  Patient coming from: Home  Chief Complaint:  Chief Complaint  Patient presents with   Fall   HPI: Terry Garcia is a 78 y.o. male with COPD, tobacco abuse, self-reported CHF on Lasix  (no echo on file), EtOH abuse, who presents to the ED after sustaining a fall.  Patient reports that he was sleeping at his dining room table which is common for him, but he fell off of his chair and onto his left side.  He sustained a skin abrasion to the left elbow and left side of his head.  He did not lose consciousness.  He was able to get up with the help of his family and ambulated immediately after. In the ED he underwent scalp laceration repair with 1 staple. While being worked up he complained of progressive shortness of breath over the last many weeks and was noted to have some peripheral edema.  He was found to be persistently tachycardic in the 110s to 120s.  He was also found to be tremulous. Labs are mostly unremarkable.  BNP elevation of 401.  Mildly elevated D-dimer.  CTA without PE. We are requested for admission for CHF exacerbation.   On my evaluation patient reports that he is only slightly more edematous than his baseline.  He reports he has a cardiologist outpatient and takes daily Lasix .  When discussing his alcohol intake he endorses 3 shots of right liquor daily.  His last drink was yesterday.  Review of Systems: As mentioned in the history of present illness. All other systems reviewed and are negative. Past Medical History:  Diagnosis Date   Arterial atherosclerosis    per pt   COPD (chronic obstructive pulmonary disease) (HCC)    controlled with daily inhaler and albuterol  as needed   GERD (gastroesophageal reflux disease)    no meds needed   History of kidney stones    needed open abdominal surgery for stone  in 1978, no stones since   Hypertension    amlodipine  and lisinopril/HCTZ   Right bundle branch block    per pt, managed by primary, does not see cardiologist   Past Surgical History:  Procedure Laterality Date   CYST REMOVAL LEG  1980   removal of cyst on knee cap    INSERTION OF MESH N/A 07/13/2018   Procedure: INSERTION OF MESH;  Surgeon: Vanderbilt Ned, MD;  Location: [facility];  Service: General;  Laterality: N/A;   KIDNEY SURGERY  1978   needed open abdominal surgery to remove stone   PILONIDAL CYST EXCISION  1970   UMBILICAL HERNIA REPAIR N/A 07/13/2018   Procedure: UMBILICAL HERNIA REPAIR WITH MESH;  Surgeon: Vanderbilt Ned, MD;  Location: [facility];  Service: General;  Laterality: N/A;   Social History:  reports that he has been smoking. He has never used smokeless tobacco. He reports current alcohol use. He reports that he does not currently use drugs.  Allergies  Allergen Reactions   Advair Diskus [Fluticasone-Salmeterol] Other (See Comments)    Pt states gave him the shakes   Levaquin [Levofloxacin] Other (See Comments)    Pt states made him feel extreme anger     No family history on file.  Prior to Admission medications   Medication Sig Start Date End Date Taking? Authorizing Provider  nicotine  (NICODERM CQ  -  DOSED IN MG/24 HOURS) 14 mg/24hr patch Place 1 patch (14 mg total) onto the skin daily. 01/14/24 01/13/25 Yes Cyrena Mylar, MD  nicotine  polacrilex (NICOTINE  MINI) 4 MG lozenge Take 1 lozenge (4 mg total) by mouth as needed. 01/14/24  Yes Cyrena Mylar, MD  albuterol  (PROVENTIL  HFA;VENTOLIN  HFA) 108 (90 Base) MCG/ACT inhaler Inhale 1-2 puffs into the lungs every 6 (six) hours as needed for wheezing or shortness of breath.    [provider]  amLODipine  (NORVASC ) 10 MG tablet Take 10 mg by mouth daily.    [provider]  Glycopyrrolate-Formoterol (BEVESPI AEROSPHERE) 9-4.8 MCG/ACT AERO Inhale 2 puffs into the lungs at  bedtime.    [provider]  ibuprofen  (ADVIL ,MOTRIN ) 800 MG tablet Take 1 tablet (800 mg total) by mouth every 8 (eight) hours as needed. 07/13/18   Cornett, Debby, MD  lisinopril-hydrochlorothiazide (PRINZIDE,ZESTORETIC) 10-12.5 MG tablet Take 1 tablet by mouth daily.    [provider]  oxyCODONE  (OXY IR/ROXICODONE ) 5 MG immediate release tablet Take 1 tablet (5 mg total) by mouth every 6 (six) hours as needed for severe pain. 07/13/18   Vanderbilt Debby, MD    Physical Exam: Vitals:   01/14/24 0042 01/14/24 0345 01/14/24 0515 01/14/24 0520  BP: 97/81 (!) 165/87 (!) 143/90 (!) 143/90  Pulse: (!) 119 (!) 122 (!) 120 (!) 120  Resp: 20   20  Temp: 98.5 F (36.9 C)   98.6 F (37 C)  TempSrc: Oral   Oral  SpO2: 96% (!) 89% 95% 95%   Constitutional:  Normal appearance. Non toxic-appearing.  HENT: Head Normocephalic and atraumatic.  Mucous membranes are moist.  Eyes:  Extraocular intact. Conjunctivae normal. Pupils are equal, round, and reactive to light.  Cardiovascular: Tachycardic Pulmonary: Non labored, symmetric rise of chest wall.  2 L O2 in place Musculoskeletal:  Normal range of motion.  Clubbing of all fingernails. Skin: warm and dry. not jaundiced.  Neurological: alert. Oriented.  Tremors bilaterally with outstretched hands Psychiatric: Mood and Affect congruent.   Data Reviewed:     Latest Ref Rng & Units 01/14/2024   12:43 AM 07/07/2018   10:00 AM  CBC  WBC 4.0 - 10.5 K/uL 6.1  6.9   Hemoglobin 13.0 - 17.0 g/dL 84.2  83.2   Hematocrit 39.0 - 52.0 % 47.0  49.7   Platelets 150 - 400 K/uL 163  162       Latest Ref Rng & Units 01/14/2024   12:43 AM 07/07/2018   10:00 AM  BMP  Glucose 70 - 99 mg/dL 868  886   BUN 8 - 23 mg/dL 8  5   Creatinine 9.38 - 1.24 mg/dL 9.06  9.20   Sodium 864 - 145 mmol/L 138  136   Potassium 3.5 - 5.1 mmol/L 4.1  4.2   Chloride 98 - 111 mmol/L 96  98   CO2 22 - 32 mmol/L 30  29   Calcium  8.9 - 10.3 mg/dL 9.2  89.9       Assessment and Plan: Tachycardia - Possibly related to CHF though I have higher suspicion of EtOH withdrawal - Status post Ativan  in the ED - Initiate CIWA protocol - As needed Ativan   CHF - Patient endorses history of CHF, no echocardiogram on file - Takes 20 mg Lasix  daily. - Will schedule 40 mg IVP twice daily for now, taper to p.o. as tolerated. - Some peripheral edema noted though skin changes suggest this is chronic - Will obtain new echocardiogram -  Strict I's and O's - Low-sodium diet - Intermittently requiring 1 to 2 L O2.  No significant edema seen on CTA.  Wean O2 as able  EtOH abuse - 3 shots whiskey daily - CIWA protocol as above.  Patient is tremulous and tachycardic. - Schedule 2 mg Ativan  every 6 hours, continue CIWA protocol -- Monitor on tele - On thiamine  and multivitamin  Tobacco abuse - Continue nicotine  patch  COPD - Currently requiring 2 L O2, no wheezing appreciated on exam. - Resume home dose inhalers - As needed DuoNebs  Hypertension - Resume home meds  Scalp laceration - Repaired in ED with 1 staple - Remove stable outpatient within 1 week  Fall - Sustained fall off his kitchen chair where he was sleeping. - Head CT negative - PT/OT evals   Advance Care Planning: After discussion with patient directly he requests to be full code.  He desires CPR and intubation in the event of cardiac or respiratory arrest  Consults: n/a  Family Communication: n/a. Discussed directly with patient   Severity of Illness: The appropriate patient status for this patient is OBSERVATION. Observation status is judged to be reasonable and necessary in order to provide the required intensity of service to ensure the patient's safety. The patient's presenting symptoms, physical exam findings, and initial radiographic and laboratory data in the context of their medical condition is felt to place them at decreased risk for further clinical deterioration.  Furthermore, it is anticipated that the patient will be medically stable for discharge from the hospital within 2 midnights of admission.   Author: Chaunta Bejarano, DO 01/14/2024 9:22 AM  For on call review [email or web address].

## 2024-01-14 NOTE — ED Provider Notes (Addendum)
 [facility] Provider Note    Event Date/Time   First MD Initiated Contact with Patient 01/14/24 0402     (approximate)   History   Fall   HPI  Terry Garcia is a 78 y.o. male   Past medical history of COPD not on oxygen, smoker, self-reported CHF on Lasix , who presents to the Emergency Department with a fall.  He states that he fell asleep at the dining room table which is not uncommon for him, he sits there and watches TV and put the pillow down on the tabletop and falls asleep often.  Tonight he fell off of his chair onto his left side and got a skin abrasion to the left elbow and hit the left side of his head.  He did not lose consciousness does not take blood thinners and he was able to get up with help from his family and ambulate afterwards.  He denies any chest pain palpitations or any other syncopal symptoms or presyncopal symptoms during this event.  He does note that over the last several days he has felt increasingly short of breath with minimal exertion and has had difficulty laying flat due to shortness of breath. He's noticed worsening swelling in both legs.   He denies any unilateral leg pain or swelling and does not take blood thinners nor has he ever had a blood clot.  He has a chronic smoker's cough which is unchanged.  He does drink alcohol daily, in the evenings he has a tall glass of whiskey about 3 fingers with ginger ale.  External Medical Documents Reviewed: General surgery note from 2020      Physical Exam   Triage Vital Signs: ED Triage Vitals [01/14/24 0042]  Encounter Vitals Group     BP 97/81     Girls Systolic BP Percentile      Girls Diastolic BP Percentile      Boys Systolic BP Percentile      Boys Diastolic BP Percentile      Pulse Rate (!) 119     Resp 20     Temp 98.5 F (36.9 C)     Temp Source Oral     SpO2 96 %     Weight      Height      Head Circumference      Peak Flow      Pain Score       Pain Loc      Pain Education      Exclude from Growth Chart     Most recent vital signs: Vitals:   01/14/24 0515 01/14/24 0520  BP: (!) 143/90 (!) 143/90  Pulse: (!) 120 (!) 120  Resp:  20  Temp:  98.6 F (37 C)  SpO2: 95% 95%    General: Awake, no distress.  CV:  Good peripheral perfusion. Resp:  Normal effort.  Abd:  No distention.  Other:  Pleasant gentleman who is sitting in the stretcher and refusing to lay down because he is short of breath.  He looks slightly anxious and has some tremors to bilateral hands that get better with movement.  He is tachycardic to 120s.  His oxygen is 90% on room air.  He is in no respiratory distress and he is no obvious rales or wheezing or focalities on lung examination.  He has slightly hypertensive at 160s over 80s.  He is afebrile.  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  He has no gross neurologic deficits including both sensory changes dysarthria facial asymmetry   ED Results / Procedures / Treatments   Labs (all labs ordered are listed, but only abnormal results are displayed) Labs Reviewed  COMPREHENSIVE METABOLIC PANEL WITH GFR - Abnormal; Notable for the following components:      Result Value   Chloride 96 (*)    Glucose, Bld 131 (*)    All other components within normal limits  URINALYSIS, ROUTINE W REFLEX MICROSCOPIC - Abnormal; Notable for the following components:   Color, Urine YELLOW (*)    APPearance CLEAR (*)    Protein, ur 100 (*)    All other components within normal limits  D-DIMER, QUANTITATIVE - Abnormal; Notable for the following components:   D-Dimer, Quant 1.19 (*)    All other components within normal limits  BRAIN NATRIURETIC PEPTIDE - Abnormal; Notable for the following components:   B Natriuretic Peptide 401.0 (*)    All other components within normal limits  TROPONIN I (HIGH SENSITIVITY) - Abnormal; Notable for the following  components:   Troponin I (High Sensitivity) 25 (*)    All other components within normal limits  TROPONIN I (HIGH SENSITIVITY) - Abnormal; Notable for the following components:   Troponin I (High Sensitivity) 27 (*)    All other components within normal limits  RESP PANEL BY RT-PCR (RSV, FLU A&B, COVID)  RVPGX2  CULTURE, BLOOD (ROUTINE X 2)  CULTURE, BLOOD (ROUTINE X 2)  CBC  LACTIC ACID, PLASMA  LACTIC ACID, PLASMA  CBG MONITORING, ED     I ordered and reviewed the above labs they are notable for cell counts and electrolytes are unremarkable.  EKG  ED ECG REPORT I, Ginnie Shams, the attending physician, personally viewed and interpreted this ECG.   Date: 01/14/2024  EKG Time: 0042  Rate: 112  Rhythm: Sinus tachycardia  Axis: nl  Intervals: Bifascicular block, similar to prior in 2020  ST&T Change: No STEMI    RADIOLOGY I independently reviewed and interpreted CT scan of the head see no obvious bleeding or midline shift I also reviewed radiologist's formal read.   PROCEDURES:  Critical Care performed: No  .Laceration Repair  Date/Time: 01/14/2024 4:55 AM  Performed by: Shams Ginnie, MD Authorized by: Shams Ginnie, MD   Consent:    Consent obtained:  Verbal   Consent given by:  Patient   Risks discussed:  Infection and poor wound healing   Alternatives discussed:  No treatment and delayed treatment Universal protocol:    Procedure explained and questions answered to patient or proxy's satisfaction: yes     Patient identity confirmed:  Verbally with patient Laceration details:    Location:  Scalp   Scalp location:  Frontal   Length (cm):  0.5   Depth (mm):  2 Exploration:    Imaging outcome: foreign body not noted     Wound extent: no foreign body   Treatment:    Area cleansed with:  Povidone-iodine   Irrigation solution:  Tap water Skin repair:    Repair method:  Staples   Number of staples:  1 Approximation:    Approximation:  Close Repair type:     Repair type:  Simple Post-procedure details:    Dressing:  Open (no dressing)    MEDICATIONS ORDERED IN ED: Medications  nicotine  (NICODERM CQ  - dosed in mg/24 hours) patch 14 mg (14 mg Transdermal Patch Applied 01/14/24 0508)  LORazepam  (ATIVAN ) tablet  1-4 mg (has no administration in time range)  thiamine  (VITAMIN B1) tablet 100 mg (has no administration in time range)    Or  thiamine  (VITAMIN B1) injection 100 mg (has no administration in time range)  folic acid  (FOLVITE ) tablet 1 mg (has no administration in time range)  multivitamin with minerals tablet 1 tablet (has no administration in time range)  LORazepam  (ATIVAN ) tablet 2 mg (2 mg Oral Given 01/14/24 0507)  iohexol  (OMNIPAQUE ) 350 MG/ML injection 100 mL (100 mLs Intravenous Contrast Given 01/14/24 0605)  furosemide  (LASIX ) injection 40 mg (40 mg Intravenous Given 01/14/24 9350)    External physician / consultants:  I spoke with hospital medicine admission and regarding care plan for this patient.   IMPRESSION / MDM / ASSESSMENT AND PLAN / ED COURSE  I reviewed the triage vital signs and the nursing notes.                                Patient's presentation is most consistent with acute presentation with potential threat to life or bodily function.  Differential diagnosis includes, but is not limited to, syncope cardiogenic, ACS, PE, CHF exacerbation, alcohol withdrawal, electrolyte derangements   The patient is on the cardiac monitor to evaluate for evidence of arrhythmia and/or significant heart rate changes.  MDM:    This patient with a fall from his seated position and head strike with a small laceration to the scalp but fortunately CT imaging without any intracranial bleeding or skull fracture.  Skin tears to the left arm appear superficial and no bony tenderness and full active range of motion so I doubt bony injury.  He states that he fell off of a seated position and had no presyncopal symptoms so I think this  was mechanical in nature.  However he has felt increasingly short of breath with minimal exertion and orthopneic with a history of CHF and some peripheral edema I expect he has a CHF exacerbation, and he is tachycardic persistently.    There may be an element of alcohol withdrawal given his daily alcohol use and some hand tremors.  Will give fluids and p.o. Ativan  and keep on cardiac monitor.  I considered PE though I think this is less likely given his symptoms more consistent with CHF exacerbation and history of the same.  However given his persistent tachycardia, brief episode of hypotension upon triage, and questionable syncope today, this is on the differential - will check a D-dimer.  Without chest pain I doubt cardiac ischemia, EKG has bifascicular block that is similar to his 2020 EKG, and no acute ischemic changes, will check serial troponin.  In terms of fluid management, he appears grossly euvolemic at this time and I am not certain what is driving his tachycardia (infection, CHF, PE, withdrawal).  Fortunately he is normotensive and comfortable and no respiratory distress.    CTA reviewed by me with no large clot. Given highest clinical suspicion for CHF, will give IV lasix . Formal read of CTA pending, then will admit to hospital service.    --  I spent 5 minutes counseling this patient on smoking cessation.  We spoke about the patient's current tobacco use, impact of smoking, assessed willingness to quit, methods for cessation including medical management and nicotine  replacement therapy (which I prescribed to the patient) and advised follow-up with primary doctor to continue to address smoking cessation.     FINAL CLINICAL IMPRESSION(S) / ED  DIAGNOSES   Final diagnoses:  Fall, initial encounter  Alcohol use  Tachycardia  Traumatic injury of head, initial encounter  Skin tear of forearm without complication, left, initial encounter  Orthopnea  Exertional dyspnea  Stapled  skin wound  Acute on chronic congestive heart failure, unspecified heart failure type (HCC)     Rx / DC Orders   ED Discharge Orders          Ordered    nicotine  polacrilex (NICOTINE  MINI) 4 MG lozenge  As needed        01/14/24 0455    nicotine  (NICODERM CQ  - DOSED IN MG/24 HOURS) 14 mg/24hr patch  Every 24 hours        01/14/24 0455             Note:  This document was prepared using Dragon voice recognition software and may include unintentional dictation errors.    Cyrena Mylar, MD 01/14/24 9543    Cyrena Mylar, MD 01/14/24 9473    Cyrena Mylar, MD 01/14/24 0700

## 2024-01-14 NOTE — ED Notes (Signed)
 Called CCMD and added pt to board.

## 2024-01-14 NOTE — ED Triage Notes (Addendum)
 Patient C/O fall. Patient states that he fell asleep at his dining room table and fell over onto the floor. Patient is C/O laceration to left top of his head (bleeding controlled, denies blood thinners), and skin tear and hematoma to left upper forearm. Patient is hypotensive in triage, BP is 97/81. He states that he's normally in the 140's. Patient states he feels a little shaky, but not necessarily dizzy. Patient denies any headache, blurred vision, or chest pain. He also states that he does have SOB at his baseline, but feels a little more SOB at this time (he does have history of CHF and COPD and is a current smoker) Denies any fever or infectious symptoms.

## 2024-01-14 NOTE — ED Notes (Signed)
 Pt is CAOx4, breathing normally, and normal in color. Pt is relaxing in bed and is in NAD. Pt denies any needs at this time.

## 2024-01-14 NOTE — Discharge Instructions (Addendum)
 See your doctor return to the emergency department in 7 to 10 days for staple removal

## 2024-01-14 NOTE — ED Notes (Signed)
Pt given breakfast meal

## 2024-01-14 NOTE — ED Provider Notes (Signed)
 Care of this patient assumed from prior physician at 0700 pending CTA, anticipated admission. Please see prior physician note for further details.  Briefly this is a 78 year old male who presents after a fall in the setting of shortness of breath.  Reports history of orthopnea, noted to have peripheral edema and tachycardia on exam.  Labs with reassuring CBC, CMP without significant derangement.  Troponin slightly elevated at 27.  BNP elevated at 400.  Clinical suspicion for CHF exacerbation, but elevated D-dimer, CTA ordered, read pending at time of signout.  Patient given IV Lasix .  CTA resulted without evidence of PE.  Patient reassessed.  Initially sleeping when I entered the room, satting 85% with a good pleth on room air.  Remains tachycardic with heart rates in the 1 teens, increased to the 120s when awakened.  When he woke up sats improved to 90, but I went ahead and put him on 2 L as I suspect he will desat again when he falls asleep.  I updated him on the results of his CT scan.  He is comfortable discharge home.  Will reach out to hospitalist team.  Case discussed with hospitalist team.  They will evaluate for anticipated admission.     Levander Slate, MD 01/14/24 [phone]

## 2024-01-15 ENCOUNTER — Other Ambulatory Visit: Payer: Self-pay

## 2024-01-15 ENCOUNTER — Encounter: Payer: Self-pay | Admitting: Family Medicine

## 2024-01-15 ENCOUNTER — Observation Stay: Admit: 2024-01-15

## 2024-01-15 DIAGNOSIS — I4510 Unspecified right bundle-branch block: Secondary | ICD-10-CM | POA: Diagnosis present

## 2024-01-15 DIAGNOSIS — J441 Chronic obstructive pulmonary disease with (acute) exacerbation: Secondary | ICD-10-CM | POA: Diagnosis present

## 2024-01-15 DIAGNOSIS — S51812A Laceration without foreign body of left forearm, initial encounter: Secondary | ICD-10-CM | POA: Diagnosis not present

## 2024-01-15 DIAGNOSIS — F10139 Alcohol abuse with withdrawal, unspecified: Secondary | ICD-10-CM | POA: Diagnosis present

## 2024-01-15 DIAGNOSIS — Z79899 Other long term (current) drug therapy: Secondary | ICD-10-CM | POA: Diagnosis not present

## 2024-01-15 DIAGNOSIS — S50312A Abrasion of left elbow, initial encounter: Secondary | ICD-10-CM | POA: Diagnosis present

## 2024-01-15 DIAGNOSIS — Z881 Allergy status to other antibiotic agents status: Secondary | ICD-10-CM | POA: Diagnosis not present

## 2024-01-15 DIAGNOSIS — I110 Hypertensive heart disease with heart failure: Secondary | ICD-10-CM | POA: Diagnosis present

## 2024-01-15 DIAGNOSIS — I361 Nonrheumatic tricuspid (valve) insufficiency: Secondary | ICD-10-CM | POA: Diagnosis not present

## 2024-01-15 DIAGNOSIS — I10 Essential (primary) hypertension: Secondary | ICD-10-CM | POA: Diagnosis not present

## 2024-01-15 DIAGNOSIS — F1090 Alcohol use, unspecified, uncomplicated: Secondary | ICD-10-CM | POA: Diagnosis not present

## 2024-01-15 DIAGNOSIS — I4891 Unspecified atrial fibrillation: Secondary | ICD-10-CM | POA: Diagnosis not present

## 2024-01-15 DIAGNOSIS — W19XXXA Unspecified fall, initial encounter: Secondary | ICD-10-CM | POA: Diagnosis not present

## 2024-01-15 DIAGNOSIS — Z7901 Long term (current) use of anticoagulants: Secondary | ICD-10-CM | POA: Diagnosis not present

## 2024-01-15 DIAGNOSIS — S0101XA Laceration without foreign body of scalp, initial encounter: Secondary | ICD-10-CM | POA: Diagnosis present

## 2024-01-15 DIAGNOSIS — R54 Age-related physical debility: Secondary | ICD-10-CM | POA: Diagnosis present

## 2024-01-15 DIAGNOSIS — I2510 Atherosclerotic heart disease of native coronary artery without angina pectoris: Secondary | ICD-10-CM | POA: Diagnosis not present

## 2024-01-15 DIAGNOSIS — Z1152 Encounter for screening for COVID-19: Secondary | ICD-10-CM | POA: Diagnosis not present

## 2024-01-15 DIAGNOSIS — F10932 Alcohol use, unspecified with withdrawal with perceptual disturbance: Secondary | ICD-10-CM | POA: Diagnosis not present

## 2024-01-15 DIAGNOSIS — W07XXXA Fall from chair, initial encounter: Secondary | ICD-10-CM | POA: Diagnosis present

## 2024-01-15 DIAGNOSIS — F17210 Nicotine dependence, cigarettes, uncomplicated: Secondary | ICD-10-CM | POA: Diagnosis present

## 2024-01-15 DIAGNOSIS — I420 Dilated cardiomyopathy: Secondary | ICD-10-CM | POA: Diagnosis present

## 2024-01-15 DIAGNOSIS — Y92009 Unspecified place in unspecified non-institutional (private) residence as the place of occurrence of the external cause: Secondary | ICD-10-CM | POA: Diagnosis not present

## 2024-01-15 DIAGNOSIS — I5043 Acute on chronic combined systolic (congestive) and diastolic (congestive) heart failure: Secondary | ICD-10-CM | POA: Diagnosis present

## 2024-01-15 DIAGNOSIS — I25118 Atherosclerotic heart disease of native coronary artery with other forms of angina pectoris: Secondary | ICD-10-CM | POA: Diagnosis present

## 2024-01-15 DIAGNOSIS — R000 Tachycardia, unspecified: Secondary | ICD-10-CM | POA: Diagnosis present

## 2024-01-15 DIAGNOSIS — Z8249 Family history of ischemic heart disease and other diseases of the circulatory system: Secondary | ICD-10-CM | POA: Diagnosis not present

## 2024-01-15 DIAGNOSIS — Z888 Allergy status to other drugs, medicaments and biological substances status: Secondary | ICD-10-CM | POA: Diagnosis not present

## 2024-01-15 DIAGNOSIS — J9601 Acute respiratory failure with hypoxia: Secondary | ICD-10-CM | POA: Diagnosis present

## 2024-01-15 DIAGNOSIS — I340 Nonrheumatic mitral (valve) insufficiency: Secondary | ICD-10-CM | POA: Diagnosis not present

## 2024-01-15 DIAGNOSIS — I255 Ischemic cardiomyopathy: Secondary | ICD-10-CM | POA: Diagnosis present

## 2024-01-15 DIAGNOSIS — I959 Hypotension, unspecified: Secondary | ICD-10-CM | POA: Diagnosis present

## 2024-01-15 DIAGNOSIS — I4819 Other persistent atrial fibrillation: Secondary | ICD-10-CM | POA: Diagnosis present

## 2024-01-15 DIAGNOSIS — I5021 Acute systolic (congestive) heart failure: Secondary | ICD-10-CM | POA: Diagnosis not present

## 2024-01-15 DIAGNOSIS — I509 Heart failure, unspecified: Secondary | ICD-10-CM | POA: Diagnosis not present

## 2024-01-15 DIAGNOSIS — F10939 Alcohol use, unspecified with withdrawal, unspecified: Secondary | ICD-10-CM | POA: Diagnosis present

## 2024-01-15 LAB — COMPREHENSIVE METABOLIC PANEL WITH GFR
ALT: 15 U/L (ref 0–44)
AST: 17 U/L (ref 15–41)
Albumin: 3.3 g/dL — ABNORMAL LOW (ref 3.5–5.0)
Alkaline Phosphatase: 51 U/L (ref 38–126)
Anion gap: 8 (ref 5–15)
BUN: 10 mg/dL (ref 8–23)
CO2: 32 mmol/L (ref 22–32)
Calcium: 8.6 mg/dL — ABNORMAL LOW (ref 8.9–10.3)
Chloride: 97 mmol/L — ABNORMAL LOW (ref 98–111)
Creatinine, Ser: 0.74 mg/dL (ref 0.61–1.24)
GFR, Estimated: >60 mL/min (ref >60)
Glucose, Bld: 107 mg/dL — ABNORMAL HIGH (ref 70–99)
Potassium: 3 mmol/L — ABNORMAL LOW (ref 3.5–5.1)
Sodium: 137 mmol/L (ref 135–145)
Total Bilirubin: 1.4 mg/dL — ABNORMAL HIGH (ref 0.0–1.2)
Total Protein: 6 g/dL — ABNORMAL LOW (ref 6.5–8.1)

## 2024-01-15 LAB — CBC
HCT: 40.5 % (ref 39.0–52.0)
Hemoglobin: 13.8 g/dL (ref 13.0–17.0)
MCH: 31.2 pg (ref 26.0–34.0)
MCHC: 34.1 g/dL (ref 30.0–36.0)
MCV: 91.6 fL (ref 80.0–100.0)
Platelets: 150 K/uL (ref 150–400)
RBC: 4.42 MIL/uL (ref 4.22–5.81)
RDW: 13.7 % (ref 11.5–15.5)
WBC: 5.3 K/uL (ref 4.0–10.5)
nRBC: 0 % (ref 0.0–0.2)

## 2024-01-15 LAB — PROTIME-INR
INR: 1 (ref 0.8–1.2)
Prothrombin Time: 13.9 s (ref 11.4–15.2)

## 2024-01-15 MED ORDER — METHYLPREDNISOLONE SODIUM SUCC 40 MG IJ SOLR
40.0000 mg | Freq: Two times a day (BID) | INTRAMUSCULAR | Status: AC
  Administered 2024-01-15 – 2024-01-16 (×3): 40 mg via INTRAVENOUS
  Filled 2024-01-15 (×3): qty 1

## 2024-01-15 MED ORDER — IPRATROPIUM-ALBUTEROL 0.5-2.5 (3) MG/3ML IN SOLN
3.0000 mL | Freq: Four times a day (QID) | RESPIRATORY_TRACT | Status: DC
  Administered 2024-01-15 (×2): 3 mL via RESPIRATORY_TRACT
  Filled 2024-01-15 (×2): qty 3

## 2024-01-15 MED ORDER — IPRATROPIUM-ALBUTEROL 0.5-2.5 (3) MG/3ML IN SOLN
3.0000 mL | Freq: Three times a day (TID) | RESPIRATORY_TRACT | Status: DC
  Administered 2024-01-16: 3 mL via RESPIRATORY_TRACT
  Filled 2024-01-15: qty 3

## 2024-01-15 MED ORDER — POTASSIUM CHLORIDE CRYS ER 20 MEQ PO TBCR
40.0000 meq | EXTENDED_RELEASE_TABLET | Freq: Once | ORAL | Status: AC
  Administered 2024-01-15: 40 meq via ORAL
  Filled 2024-01-15: qty 2

## 2024-01-15 NOTE — Care Management Obs Status (Signed)
 MEDICARE OBSERVATION STATUS NOTIFICATION   Patient Details  Name: Terry Garcia MRN: [id number] Date of Birth: December 05, 1945   Medicare Observation Status Notification Given:  No (patient did not want a copy)    [name] 01/15/2024, 11:55 AM

## 2024-01-15 NOTE — ED Notes (Signed)
 Afib noted on the monitor, EKG obtained and confirms a fib. No history of same noted and pt not currently prescribed blood thinners, NAD noted and Dr. Lawence notified

## 2024-01-15 NOTE — Evaluation (Signed)
 Physical Therapy Evaluation Patient Details Name: Terry Garcia MRN: [id number] DOB: 1946/01/26 Today's Date: 01/15/2024  History of Present Illness  Pt admitted to [facility] on 01/14/24 under observation for c/o mechanical fall resulting in laceration to L side of head. Also endorses progressive DOE and difficulty lying flat. Admitted for further management of hypotension and CHF exacerbation. Imaging negative for acute abnormality. Significant PMH includes:  COPD, tobacco abuse, self-reported CHF on Lasix  (no echo on file), EtOH abuse.   Clinical Impression  Pt is a 78 year old M admitted to hospital on 01/14/24 for CHF exacerbation. At baseline, pt is mod I for ADL's, IADL's, ambulation with walking stick, and medication management; does not drive and denies falls in last 82mo.   Pt presents with decreased activity tolerance, decreased standing balance, impaired skin integrity, and increased O2 dependence from baseline, resulting in impaired functional mobility from baseline. Due to deficits, pt required mod I for bed mobility, SBA for transfers, and SBA to ambulate 59ft with UUE support. Noted to have asymptomatic during ambulation. Walk test performed: dropped to 82% on RA during ambulation and required 2.5L, prolonged seated rest break, and cues for pursed lip breathing for recovery. Therefore, he will likely benefit from supplemental O2 at DC.  Deficits limit the pt's ability to safely and independently perform ADL's, transfer, and ambulate. Pt will benefit from acute skilled PT services to address deficits for return to baseline function. Pt will benefit from post acute therapy services to address deficits for return to baseline function.  Would encourage OOB mobility for meals and toileting, with nursing and use of RW, for continued progress towards goals and maintenance of IND with functional mobility.         If plan is discharge home, recommend the following: A little help with  bathing/dressing/bathroom;Assist for transportation;Assistance with cooking/housework   Can travel by private vehicle    Yes    Equipment Recommendations Rolling walker (2 wheels);BSC/3in1  Recommendations for Other Services       Functional Status Assessment Patient has had a recent decline in their functional status and demonstrates the ability to make significant improvements in function in a reasonable and predictable amount of time.     Precautions / Restrictions Precautions Precautions: Fall Precaution/Restrictions Comments: Strict I/O; SpO2 >/= 92%      Mobility  Bed Mobility Overal bed mobility: Modified Independent             General bed mobility comments: sit EOB, HOB elevated; left seated EOB with OTS    Transfers                   General transfer comment: SBA for safety for STS at EOB without AD, use of BUE for support, demonstrates good eccentric lowering with proper hand placement    Ambulation/Gait   Gait Distance (Feet): 40 Feet (101ft x2)           General Gait Details: SBA for safety to ambulate to/from bathroom with UE support on IV pole for steadying. Demonstrates slowed cadence with decreased step length/foot clearance bilaterally.     Balance Overall balance assessment: Needs assistance Sitting-balance support: No upper extremity supported, Feet supported Sitting balance-Leahy Scale: Normal     Standing balance support: During functional activity, Single extremity supported, Bilateral upper extremity supported Standing balance-Leahy Scale: Fair Standing balance comment: requires UE support for steadying; increased A/P sway while standing to use bathroom demonstrating appropriate ankle righting reactive strategies for maintenance of balance  Pertinent Vitals/Pain Pain Assessment Pain Assessment: No/denies pain    Home Living Family/patient expects to be discharged to:: Private  residence Living Arrangements: Spouse/significant other Available Help at Discharge: Family;Available 24 hours/day (spouse unable to provide physical assist; stepson available for physical assist PRN) Type of Home: House Home Access: Level entry       Home Layout: One level Home Equipment: Grab bars - tub/shower;Grab bars - toilet;Cane - single point      Prior Function               Mobility Comments: UE support on grab bars for tub transfers; ambulates with walking stick, limited ambulation. Does not drive. ADLs Comments: IND with ADL's, IADL's, medication management.     Extremity/Trunk Assessment   Upper Extremity Assessment Upper Extremity Assessment: Overall WFL for tasks assessed (sensation and coordination intact)    Lower Extremity Assessment Lower Extremity Assessment: Overall WFL for tasks assessed (sensation and coordination intact; no edema, dry flakey skin)    Cervical / Trunk Assessment Cervical / Trunk Assessment: Normal  Communication   Communication Communication: No apparent difficulties    Cognition Arousal: Alert Behavior During Therapy: WFL for tasks assessed/performed   PT - Cognitive impairments: No apparent impairments                         Following commands: Intact       Cueing Cueing Techniques: Verbal cues, Tactile cues     General Comments General comments (skin integrity, edema, etc.): dry skin and skin discoloration to BLE; scattered bruising to BUE. No edema noted. Walk test performed: dropped to 82% on RA, requiring 2.5L, seated rest break for multiple minutes, and cues for pursed lip breathing for recovery. Afib throughout session reaching 145bpm max; lowest HR noted was 102bpm.    Exercises Other Exercises Other Exercises: Pt educated re: PT role/POC, DC recommendations, activity pacing, activity tolerance, Afib, increased O2 requirements, use of DME for energy conservation at DC, call for help, OOB ambulation  to/from bathroom with nursing. He verbalized understanding.   Assessment/Plan    PT Assessment Patient needs continued PT services  PT Problem List Decreased activity tolerance;Decreased balance;Decreased mobility;Cardiopulmonary status limiting activity       PT Treatment Interventions DME instruction;Gait training;Functional mobility training;Therapeutic activities;Balance training;Neuromuscular re-education    PT Goals (Current goals can be found in the Care Plan section)  Acute Rehab PT Goals Patient Stated Goal: go home PT Goal Formulation: With patient Time For Goal Achievement: 01/29/24 Potential to Achieve Goals: Good    Frequency Min 2X/week        AM-PAC PT 6 Clicks Mobility  Outcome Measure Help needed turning from your back to your side while in a flat bed without using bedrails?: None Help needed moving from lying on your back to sitting on the side of a flat bed without using bedrails?: None Help needed moving to and from a bed to a chair (including a wheelchair)?: A Little Help needed standing up from a chair using your arms (e.g., wheelchair or bedside chair)?: A Little Help needed to walk in hospital room?: A Little Help needed climbing 3-5 steps with a railing? : A Little 6 Click Score: 20    End of Session Equipment Utilized During Treatment: Gait belt;Oxygen Activity Tolerance: Patient tolerated treatment well Patient left: in bed;with call bell/phone within reach (left in care of OTS) Nurse Communication: Mobility status (O2) PT Visit Diagnosis: Unsteadiness on feet (R26.81)  Time: 9193-9158 PT Time Calculation (min) (ACUTE ONLY): 35 min   Charges:   PT Evaluation $PT Eval Moderate Complexity: 1 Mod PT Treatments $Therapeutic Activity: 8-22 mins PT General Charges $$ ACUTE PT VISIT: 1 Visit         Camie CHARLENA Kluver, PT, DPT 8:53 AM,01/15/24 Physical Therapist -  [facility]

## 2024-01-15 NOTE — Progress Notes (Signed)
 Occupational Therapy Evaluation Patient Details Name: Terry Garcia MRN: [id number] DOB: 07-03-1946 Today's Date: 01/15/2024   History of Present Illness   Pt admitted to [facility] on 01/14/24 under observation for c/o mechanical fall resulting in laceration to L side of head. Also endorses progressive DOE and difficulty lying flat. Admitted for further management of hypotension and CHF exacerbation. Imaging negative for acute abnormality. Significant PMH includes:  COPD, tobacco abuse, self-reported CHF on Lasix  (no echo on file), EtOH abuse.     Clinical Impressions Terry Garcia was seen for OT evaluation this date. Prior to hospital admission, pt was IND in ADLs. Pt lives w/ significant other. Pt currently requires SBA + RW for ambulation and standing grooming at sink. SpO2 remained in high 80's to low 90's on 2 L O2; Afib noted throughout session. Pt educated on pursed lip breathing w/ verbal reminders throughout session. Pt would benefit from skilled OT services to address noted impairments and functional limitations (see below for any additional details) in order to maximize safety and independence while minimizing falls risk. Do not anticipate/the need for follow up OT services upon acute hospital DC.      If plan is discharge home, recommend the following:   A little help with walking and/or transfers;A little help with bathing/dressing/bathroom;Assistance with cooking/housework;Help with stairs or ramp for entrance     Functional Status Assessment   Patient has had a recent decline in their functional status and demonstrates the ability to make significant improvements in function in a reasonable and predictable amount of time.     Equipment Recommendations   BSC/3in1     Recommendations for Other Services         Precautions/Restrictions   Precautions Precautions: Fall Recall of Precautions/Restrictions: Intact Restrictions Weight Bearing Restrictions Per Provider  Order: No     Mobility Bed Mobility Overal bed mobility: Modified Independent                  Transfers Overall transfer level: Needs assistance Equipment used: Rolling walker (2 wheels) Transfers: Sit to/from Stand Sit to Stand: Supervision (SBA)                  Balance Overall balance assessment: Needs assistance Sitting-balance support: No upper extremity supported, Feet supported Sitting balance-Leahy Scale: Normal     Standing balance support: During functional activity, Single extremity supported Standing balance-Leahy Scale: Fair                             ADL either performed or assessed with clinical judgement   ADL Overall ADL's : Needs assistance/impaired                                       General ADL Comments: SBA for standing grooming at sink; SUPERVISION for donning shoes     Vision         Perception         Praxis         Pertinent Vitals/Pain Pain Assessment Pain Assessment: No/denies pain     Extremity/Trunk Assessment Upper Extremity Assessment Upper Extremity Assessment: Overall WFL for tasks assessed   Lower Extremity Assessment Lower Extremity Assessment: Overall WFL for tasks assessed   Cervical / Trunk Assessment Cervical / Trunk Assessment: Normal   Communication Communication Communication: No apparent difficulties   Cognition Arousal:  Alert Behavior During Therapy: [facility] for tasks assessed/performed Cognition: No apparent impairments                               Following commands: Intact       Cueing  General Comments   Cueing Techniques: Verbal cues;Gestural cues  SpO2 88-92% on 2 L w/ activity; Afib throughout session   Exercises     Shoulder Instructions      Home Living Family/patient expects to be discharged to:: Private residence Living Arrangements: Spouse/significant other Available Help at Discharge: Family;Available 24 hours/day Type  of Home: House Home Access: Level entry     Home Layout: One level     Bathroom Shower/Tub: Chief Strategy Officer: Standard     Home Equipment: Grab bars - tub/shower;Grab bars - toilet;Cane - single point          Prior Functioning/Environment Prior Level of Function : Independent/Modified Independent             Mobility Comments: UE support on grab bars for tub transfers; ambulates with walking stick, limited ambulation. Does not drive. ADLs Comments: IND with ADL's, IADL's, medication management.    OT Problem List: Decreased activity tolerance;Impaired balance (sitting and/or standing)   OT Treatment/Interventions: Self-care/ADL training;Therapeutic exercise;Energy conservation;DME and/or AE instruction      OT Goals(Current goals can be found in the care plan section)   Acute Rehab OT Goals Patient Stated Goal: to go home OT Goal Formulation: With patient Time For Goal Achievement: 01/15/24 Potential to Achieve Goals: Good ADL Goals Pt Will Transfer to Toilet: with modified independence;ambulating;regular height toilet Additional ADL Goal #1: Pt will verbalize x3 falls prevention strategies in 3/3 trials. Additional ADL Goal #2: Pt will recall x3 energy conservation strategies.   OT Frequency:  Min 1X/week    Co-evaluation              AM-PAC OT 6 Clicks Daily Activity     Outcome Measure Help from another person eating meals?: None Help from another person taking care of personal grooming?: None Help from another person toileting, which includes using toliet, bedpan, or urinal?: A Little Help from another person bathing (including washing, rinsing, drying)?: A Little Help from another person to put on and taking off regular upper body clothing?: None Help from another person to put on and taking off regular lower body clothing?: None 6 Click Score: 22   End of Session Equipment Utilized During Treatment: Gait belt;Rolling walker  (2 wheels);Oxygen  Activity Tolerance: Patient tolerated treatment well;Patient limited by fatigue Patient left: in bed;with call bell/phone within reach;with bed alarm set  OT Visit Diagnosis: Unsteadiness on feet (R26.81);Other abnormalities of gait and mobility (R26.89)                Time: 9160-9092 OT Time Calculation (min): 28 min Charges:  OT General Charges $OT Visit: 1 Visit OT Evaluation $OT Eval Low Complexity: 1 Low OT Treatments $Self Care/Home Management : 8-22 mins  [place], Student OT   [employer] 01/15/2024, 10:50 AM

## 2024-01-15 NOTE — Progress Notes (Signed)
 Patient made aware of valuables policy. Patient states he has $200 cash in wallet, along with credit cards. Patient encouraged to send wallet home or lock up with security. At this time, patient states I'll give it to my old lady.

## 2024-01-15 NOTE — Progress Notes (Signed)
 PROGRESS NOTE    Terry Garcia  FMW:[id number] DOB: 05-Feb-1946 DOA: 01/14/2024 PCP: Terry Kins, MD  Chief Complaint  Patient presents with   [facility] Course:  Terry Garcia is a 78 y.o. male with COPD, tobacco abuse, self-reported CHF on Lasix  (no echo on file), EtOH abuse, who presents to the ED after sustaining a fall.  Patient reports that he was sleeping at his dining room table which is common for him, but he fell off of his chair and onto his left side.  He sustained a skin abrasion to the left elbow and left side of his head.  He did not lose consciousness.  He was able to get up with the help of his family and ambulated immediately after. In the ED he underwent scalp laceration repair with 1 staple. While being worked up he complained of progressive shortness of breath over the last many weeks and was noted to have some peripheral edema.  He was found to be persistently tachycardic in the 110s to 120s.  He was also found to be tremulous. Labs are mostly unremarkable.  BNP elevation of 401.  Mildly elevated D-dimer.  CTA without PE. We are requested for admission for CHF exacerbation.  On my evaluation patient reports that he is only slightly more edematous than his baseline.  He reports he has a cardiologist outpatient and takes daily Lasix .  When discussing his alcohol intake he endorses 3 shots of right liquor daily.  His last drink was 7/10.  Subjective: Patient has some cough this morning.  He has been intermittently tachycardic overnight.  Objective: Vitals:   01/15/24 0554 01/15/24 0800 01/15/24 0945 01/15/24 1015  BP:  105/70 127/69 126/70  Pulse: 89 87 (!) 120 (!) 108  Resp: (!) 21 19 (!) 22 20  Temp: 98 F (36.7 C)  98.3 F (36.8 C) 98 F (36.7 C)  TempSrc: Oral   Oral  SpO2: 93% 92% 94% 93%    Intake/Output Summary (Last 24 hours) at 01/15/2024 1256 Last data filed at 01/15/2024 1153 Gross per 24 hour  Intake --  Output 300 ml  Net -300 ml    There were no vitals filed for this visit.  Examination: General exam: Appears calm and comfortable, NAD  Respiratory system: Some tachypnea, coughing, no wheeze, poor aeration bilaterally Cardiovascular system: Tachycardic  gastrointestinal system: Abdomen is nondistended, soft and nontender.  Neuro: Alert and oriented. No focal neurological deficits. Extremities: Symmetric, expected ROM Skin: No rashes, lesions Psychiatry: Demonstrates appropriate judgement and insight. Mood & affect appropriate for situation.   Assessment & Plan:   CHF - Patient endorses history of CHF, no echocardiogram on file - Takes 20 mg Lasix  daily at home - Currently on 40 mg IV twice daily - Strict I's and O's ordered, not available for review.  Have discussed with RN - Echocardiogram ordered and pending - Low-sodium diet - No significant edema seen on CTA to explain O2 requirement.  Acute hypoxic respiratory failure - Ongoing O2 requirement with tachypnea and cough on exam - Poor aeration bilaterally, no appreciable wheezing.  No crackles. - Not on oxygen at home.  Given his poor aeration suspect this may be secondary to COPD.  Will trial steroids and monitor for improvement   EtOH abuse - 3 shots whiskey daily - Patient is tremulous and tachycardic. - Continue with scheduled Ativan  2 mg every 6 hours - CIWA protocol with as needed Ativan  - Continue monitor on telemetry -  Continue thiamine  and multivitamin  Tobacco abuse - Continue nicotine  patch   COPD - Currently requiring 2 L O2, no wheezing appreciated on exam does have poor aeration - Suspicious patient is developing COPD exacerbation - Will trial steroids - Continue with DuoNebs, will schedule every 6 hours for now.  Additional available as needed - Resume home dose inhalers   Hypertension - Resume home meds   Scalp laceration - Repaired in ED with 1 staple - Remove stable outpatient within 1 week   Fall - Sustained fall off  his kitchen chair where he was sleeping. - Head CT negative - PT/OT evals   DVT prophylaxis: Heparin     Code Status: Full Code Disposition: Currently on supplemental O2, actively withdrawing from alcohol.  Requiring Ativan .  Will require home health at discharge.  Not medically cleared yet  Consultants:    Procedures:    Antimicrobials:  Anti-infectives (From admission, onward)    None       Data Reviewed: I have personally reviewed following labs and imaging studies CBC: Recent Labs  Lab 01/14/24 0043 01/15/24 0554  WBC 6.1 5.3  HGB 15.7 13.8  HCT 47.0 40.5  MCV 90.7 91.6  PLT 163 150   Basic Metabolic Panel: Recent Labs  Lab 01/14/24 0043 01/15/24 0554  NA 138 137  K 4.1 3.0*  CL 96* 97*  CO2 30 32  GLUCOSE 131* 107*  BUN 8 10  CREATININE 0.93 0.74  CALCIUM  9.2 8.6*   GFR: CrCl cannot be calculated (Unknown ideal weight.). Liver Function Tests: Recent Labs  Lab 01/14/24 0043 01/15/24 0554  AST 20 17  ALT 17 15  ALKPHOS 73 51  BILITOT 1.1 1.4*  PROT 7.1 6.0*  ALBUMIN 3.9 3.3*   CBG: No results for input(s): GLUCAP in the last 168 hours.  Recent Results (from the past 240 hours)  Resp panel by RT-PCR (RSV, Flu A&B, Covid) Anterior Nasal Swab     Status: None   Collection Time: 01/14/24 12:43 AM   Specimen: Anterior Nasal Swab  Result Value Ref Range Status   SARS Coronavirus 2 by RT PCR NEGATIVE NEGATIVE Final    Comment: (NOTE) SARS-CoV-2 target nucleic acids are NOT DETECTED.  The SARS-CoV-2 RNA is generally detectable in upper respiratory specimens during the acute phase of infection. The lowest concentration of SARS-CoV-2 viral copies this assay can detect is 138 copies/mL. A negative result does not preclude SARS-Cov-2 infection and should not be used as the sole basis for treatment or other patient management decisions. A negative result may occur with  improper specimen collection/handling, submission of specimen other than  nasopharyngeal swab, presence of viral mutation(s) within the areas targeted by this assay, and inadequate number of viral copies(<138 copies/mL). A negative result must be combined with clinical observations, patient history, and epidemiological information. The expected result is Negative.  Fact Sheet for Patients:  BloggerCourse.com  Fact Sheet for Healthcare Providers:  SeriousBroker.it  This test is no t yet approved or cleared by the [place]  FDA and  has been authorized for detection and/or diagnosis of SARS-CoV-2 by FDA under an Emergency Use Authorization (EUA). This EUA will remain  in effect (meaning this test can be used) for the duration of the COVID-19 declaration under Section 564(b)(1) of the Act, 21 U.S.C.section 360bbb-3(b)(1), unless the authorization is terminated  or revoked sooner.       Influenza A by PCR NEGATIVE NEGATIVE Final   Influenza B by PCR NEGATIVE NEGATIVE Final  Comment: (NOTE) The Xpert Xpress SARS-CoV-2/FLU/RSV plus assay is intended as an aid in the diagnosis of influenza from Nasopharyngeal swab specimens and should not be used as a sole basis for treatment. Nasal washings and aspirates are unacceptable for Xpert Xpress SARS-CoV-2/FLU/RSV testing.  Fact Sheet for Patients: BloggerCourse.com  Fact Sheet for Healthcare Providers: SeriousBroker.it  This test is not yet approved or cleared by the [place]  FDA and has been authorized for detection and/or diagnosis of SARS-CoV-2 by FDA under an Emergency Use Authorization (EUA). This EUA will remain in effect (meaning this test can be used) for the duration of the COVID-19 declaration under Section 564(b)(1) of the Act, 21 U.S.C. section 360bbb-3(b)(1), unless the authorization is terminated or revoked.     Resp Syncytial Virus by PCR NEGATIVE NEGATIVE Final    Comment:  (NOTE) Fact Sheet for Patients: BloggerCourse.com  Fact Sheet for Healthcare Providers: SeriousBroker.it  This test is not yet approved or cleared by the [place]  FDA and has been authorized for detection and/or diagnosis of SARS-CoV-2 by FDA under an Emergency Use Authorization (EUA). This EUA will remain in effect (meaning this test can be used) for the duration of the COVID-19 declaration under Section 564(b)(1) of the Act, 21 U.S.C. section 360bbb-3(b)(1), unless the authorization is terminated or revoked.  Performed at [facility], [address] [place], [place] [address]   Blood culture (routine x 2)     Status: None (Preliminary result)   Collection Time: 01/14/24  4:53 AM   Specimen: BLOOD  Result Value Ref Range Status   Specimen Description BLOOD BLOOD LEFT ARM  Final   Special Requests   Final    BOTTLES DRAWN AEROBIC AND ANAEROBIC Blood Culture adequate volume   Culture   Final    NO GROWTH < 24 HOURS Performed at [facility], [address] [place], [place] [address]    Report Status PENDING  Incomplete  Blood culture (routine x 2)     Status: None (Preliminary result)   Collection Time: 01/14/24  4:54 AM   Specimen: BLOOD  Result Value Ref Range Status   Specimen Description BLOOD BLOOD RIGHT ARM  Final   Special Requests   Final    BOTTLES DRAWN AEROBIC AND ANAEROBIC Blood Culture adequate volume   Culture   Final    NO GROWTH < 24 HOURS Performed at [facility], [address], [place], [place] [address]    Report Status PENDING  Incomplete     Radiology Studies: CT Angio Chest PE W/Cm &/Or Wo Cm Result Date: 01/14/2024 CLINICAL DATA:  Shortness of breath and positive D-dimer. EXAM: CT ANGIOGRAPHY CHEST WITH CONTRAST TECHNIQUE: Multidetector CT imaging of the chest was performed using the standard protocol during bolus administration of intravenous contrast.  Multiplanar CT image reconstructions and MIPs were obtained to evaluate the vascular anatomy. RADIATION DOSE REDUCTION: This exam was performed according to the departmental dose-optimization program which includes automated exposure control, adjustment of the mA and/or kV according to patient size and/or use of iterative reconstruction technique. CONTRAST:  100mL OMNIPAQUE  IOHEXOL  350 MG/ML SOLN COMPARISON:  Portable chest from earlier today, PA Lat chest 01/21/2021. No prior cross-sectional imaging for comparison. FINDINGS: Cardiovascular: The pulmonary trunk is prominent at 3.2 cm, both main pulmonary arteries slightly greater than 3 cm indicating arterial hypertension. The right heart chambers are slightly larger than the left, consistent with right heart dysfunction likely chronic. Pulmonary arterial opacification is diagnostic. No arterial embolus is seen.  There is mild cardiomegaly. No pericardial effusion. Coronary arteries are heavily calcified. There is slightly distended central pulmonary veins. The aorta is tortuous. Patchy calcification noted in the aorta and great vessels. Opacification of the aorta and great vessels is insufficient to assess the lumen but there are no displaced intimal plaques. Mediastinum/Nodes: No enlarged mediastinal, hilar, or axillary lymph nodes. Thyroid gland, trachea, and esophagus demonstrate no significant findings. Lungs/Pleura: There is diffuse bronchial thickening. There are posterior basal segmental bronchial impactions in the right lower lobe and bilateral scattered subsegmental bronchial impactions in both lower lobes There is asymmetric linear atelectasis in the right lower lobe. No focal pneumonia is seen. No findings of interstitial edema. The lungs are mildly emphysematous with centrilobular changes predominating. There are no visible pulmonary nodules. There is a trace right pleural effusion with no left pleural fluid. No pleural thickening or pneumothorax. Upper  Abdomen: No acute abnormality. Abdominal aortic atherosclerosis. Capsular nodularity suspected over portions of the dorsal liver surface, may be seen with early cirrhosis. There are calcified granulomas in the spleen. Musculoskeletal: There is osteopenia, kyphosis and degenerative change of thoracic spine. No acute or significant osseous findings or chest wall mass. There is an upper plate anterior wedge compression fracture deformity of the T12 vertebral body, with 30% anterior and 10% posterior height loss without retropulsion or acute appearance. Review of the MIP images confirms the above findings. IMPRESSION: 1. No evidence of arterial embolus. 2. Prominent pulmonary trunk and main pulmonary arteries indicating arterial hypertension. 3. Mild cardiomegaly with slightly distended central pulmonary veins and asymmetric prominence of the right chambers. 4. Aortic and coronary artery atherosclerosis. 5. COPD. 6. Diffuse bronchial thickening with posterior basal segmental bronchial impactions in the right lower lobe and bilateral scattered subsegmental bronchial impactions in both lower lobes. 7. Trace right pleural effusion.  No focal infiltrate. 8. Osteopenia, kyphosis and degenerative change of the thoracic spine. 9. Chronic appearing T12 compression fracture. 10. Capsular nodularity suspected over portions of the dorsal liver surface, may be seen with early cirrhosis. Aortic Atherosclerosis (ICD10-I70.0) and Emphysema (ICD10-J43.9). Electronically Signed   By: Francis Quam M.D.   On: 01/14/2024 07:12   CT Head Wo Contrast Result Date: 01/14/2024 CLINICAL DATA:  Fall EXAM: CT HEAD WITHOUT CONTRAST TECHNIQUE: Contiguous axial images were obtained from the base of the skull through the vertex without intravenous contrast. RADIATION DOSE REDUCTION: This exam was performed according to the departmental dose-optimization program which includes automated exposure control, adjustment of the mA and/or kV according to  patient size and/or use of iterative reconstruction technique. COMPARISON:  None Available. FINDINGS: Brain: There is atrophy and chronic small vessel disease changes. No acute intracranial abnormality. Specifically, no hemorrhage, hydrocephalus, mass lesion, acute infarction, or significant intracranial injury. Vascular: No hyperdense vessel or unexpected calcification. Skull: No acute calvarial abnormality. Sinuses/Orbits: No acute findings Other: Soft tissue laceration in the left lateral scalp. IMPRESSION: Atrophy, chronic microvascular disease. No acute intracranial abnormality. Electronically Signed   By: Franky Crease M.D.   On: 01/14/2024 02:02   DG Chest Portable 1 View Result Date: 01/14/2024 CLINICAL DATA:  Shortness of breath EXAM: PORTABLE CHEST 1 VIEW COMPARISON:  01/21/2021 FINDINGS: There is hyperinflation of the lungs compatible with COPD. Heart borderline in size. No confluent opacities or effusions. No acute bony abnormality. IMPRESSION: COPD. Borderline cardiomegaly. No active disease. Electronically Signed   By: Franky Crease M.D.   On: 01/14/2024 01:03    Scheduled Meds:  irbesartan   150 mg Oral Daily  And   amLODipine   10 mg Oral Daily   arformoterol   15 mcg Nebulization BID   And   umeclidinium bromide   1 puff Inhalation Daily   folic acid   1 mg Oral Daily   furosemide   40 mg Intravenous BID   heparin  injection (subcutaneous)  5,000 Units Subcutaneous Q12H   LORazepam   2 mg Oral Q6H   multivitamin with minerals  1 tablet Oral Daily   thiamine   100 mg Oral Daily   Or   thiamine   100 mg Intravenous Daily   Continuous Infusions:   LOS: 0 days  MDM: Patient is high risk for one or more organ failure.  They necessitate ongoing hospitalization for continued IV therapies and subsequent lab monitoring. Total time spent interpreting labs and vitals, reviewing the medical record, coordinating care amongst consultants and care team members, directly assessing and discussing care  with the patient and/or family: 55 min  Bijal Siglin, DO [facility]  To contact the attending physician between 7A-7P please use Epic Chat. To contact the covering physician during after hours 7P-7A, please review Amion.  01/15/2024, 12:56 PM   *This document has been created with the assistance of dictation software. Please excuse typographical errors. *

## 2024-01-16 ENCOUNTER — Inpatient Hospital Stay
Admit: 2024-01-16 | Discharge: 2024-01-16 | Disposition: A | Discharge status: Home / Self Care | Attending: Family Medicine | Admitting: Family Medicine

## 2024-01-16 DIAGNOSIS — F10932 Alcohol use, unspecified with withdrawal with perceptual disturbance: Secondary | ICD-10-CM | POA: Diagnosis not present

## 2024-01-16 LAB — COMPREHENSIVE METABOLIC PANEL WITH GFR
ALT: 15 U/L (ref 0–44)
AST: 16 U/L (ref 15–41)
Albumin: 3.4 g/dL — ABNORMAL LOW (ref 3.5–5.0)
Alkaline Phosphatase: 51 U/L (ref 38–126)
Anion gap: 9 (ref 5–15)
BUN: 11 mg/dL (ref 8–23)
CO2: 29 mmol/L (ref 22–32)
Calcium: 8.6 mg/dL — ABNORMAL LOW (ref 8.9–10.3)
Chloride: 98 mmol/L (ref 98–111)
Creatinine, Ser: 0.88 mg/dL (ref 0.61–1.24)
GFR, Estimated: >60 mL/min (ref >60)
Glucose, Bld: 104 mg/dL — ABNORMAL HIGH (ref 70–99)
Potassium: 3.1 mmol/L — ABNORMAL LOW (ref 3.5–5.1)
Sodium: 136 mmol/L (ref 135–145)
Total Bilirubin: 0.9 mg/dL (ref 0.0–1.2)
Total Protein: 6.4 g/dL — ABNORMAL LOW (ref 6.5–8.1)

## 2024-01-16 LAB — ECHOCARDIOGRAM COMPLETE
AR max vel: 1.88 cm2
AV Area VTI: 2.18 cm2
AV Area mean vel: 1.97 cm2
AV Mean grad: 4 mmHg
AV Peak grad: 8.3 mmHg
Ao pk vel: 1.44 m/s
Area-P 1/2: 6.12 cm2
Calc EF: 38.9 %
S' Lateral: 4.05 cm
Single Plane A2C EF: 41.6 %
Single Plane A4C EF: 36.5 %

## 2024-01-16 LAB — CBC WITH DIFFERENTIAL/PLATELET
Abs Immature Granulocytes: 0.02 K/uL (ref 0.00–0.07)
Basophils Absolute: 0 K/uL (ref 0.0–0.1)
Basophils Relative: 1 %
Eosinophils Absolute: 0.3 K/uL (ref 0.0–0.5)
Eosinophils Relative: 5 %
HCT: 39.3 % (ref 39.0–52.0)
Hemoglobin: 13.1 g/dL (ref 13.0–17.0)
Immature Granulocytes: 0 %
Lymphocytes Relative: 30 %
Lymphs Abs: 1.7 K/uL (ref 0.7–4.0)
MCH: 30.4 pg (ref 26.0–34.0)
MCHC: 33.3 g/dL (ref 30.0–36.0)
MCV: 91.2 fL (ref 80.0–100.0)
Monocytes Absolute: 0.5 K/uL (ref 0.1–1.0)
Monocytes Relative: 9 %
Neutro Abs: 3.1 K/uL (ref 1.7–7.7)
Neutrophils Relative %: 55 %
Platelets: 151 K/uL (ref 150–400)
RBC: 4.31 MIL/uL (ref 4.22–5.81)
RDW: 13.7 % (ref 11.5–15.5)
WBC: 5.6 K/uL (ref 4.0–10.5)
nRBC: 0 % (ref 0.0–0.2)

## 2024-01-16 LAB — PHOSPHORUS: Phosphorus: 3.6 mg/dL (ref 2.5–4.6)

## 2024-01-16 LAB — MAGNESIUM: Magnesium: 2 mg/dL (ref 1.7–2.4)

## 2024-01-16 MED ORDER — POTASSIUM CHLORIDE CRYS ER 20 MEQ PO TBCR
40.0000 meq | EXTENDED_RELEASE_TABLET | Freq: Once | ORAL | Status: AC
  Administered 2024-01-16: 40 meq via ORAL
  Filled 2024-01-16: qty 2

## 2024-01-16 MED ORDER — SODIUM CHLORIDE 0.9 % IV BOLUS
250.0000 mL | Freq: Once | INTRAVENOUS | Status: AC
  Administered 2024-01-16: 250 mL via INTRAVENOUS

## 2024-01-16 MED ORDER — METOPROLOL TARTRATE 25 MG PO TABS
25.0000 mg | ORAL_TABLET | Freq: Two times a day (BID) | ORAL | Status: DC
  Administered 2024-01-16: 25 mg via ORAL
  Filled 2024-01-16 (×3): qty 1

## 2024-01-16 MED ORDER — IPRATROPIUM-ALBUTEROL 0.5-2.5 (3) MG/3ML IN SOLN
3.0000 mL | Freq: Two times a day (BID) | RESPIRATORY_TRACT | Status: DC
  Administered 2024-01-16 – 2024-01-18 (×5): 3 mL via RESPIRATORY_TRACT
  Filled 2024-01-16 (×4): qty 3

## 2024-01-16 NOTE — Progress Notes (Signed)
 PROGRESS NOTE    Terry Garcia  FMW:[id number] DOB: [birth year] DOA: 01/14/2024 PCP: Loreli Kins, MD  Chief Complaint  Patient presents with   Terry Garcia Course:  [name] is a 78 y.o. male with COPD, tobacco abuse, self-reported CHF on Lasix  (no echo on file), EtOH abuse, who presents to the ED after sustaining a fall.  Patient reports that he was sleeping at his dining room table which is common for him, but he fell off of his chair and onto his left side.  He sustained a skin abrasion to the left elbow and left side of his head.  He did not lose consciousness.  He was able to get up with the help of his family and ambulated immediately after. In the ED he underwent scalp laceration repair with 1 staple. While being worked up he complained of progressive shortness of breath over the last many weeks and was noted to have some peripheral edema.  He was found to be persistently tachycardic in the 110s to 120s.  He was also found to be tremulous. Labs are mostly unremarkable.  BNP elevation of 401.  Mildly elevated D-dimer.  CTA without PE. We are requested for admission for CHF exacerbation.  On my evaluation patient reports that he is only slightly more edematous than his baseline.  He reports he has a cardiologist outpatient and takes daily Lasix .  When discussing his alcohol intake he endorses 3 shots of right liquor daily.  His last drink was 7/10.  Subjective: Patient persistently tachycardic.  Intermittently in atrial fibrillation.   Objective: Vitals:   01/15/24 2026 01/15/24 2331 01/16/24 0520 01/16/24 0735  BP: 106/74 95/68 101/87 125/87  Pulse: 96 94 (!) 106 (!) 109  Resp: 18 20 18 16   Temp: 97.9 F (36.6 C) 98 F (36.7 C) 98.5 F (36.9 C) 98.8 F (37.1 C)  TempSrc:  Oral    SpO2: 95% 92% 92% 97%    Intake/Output Summary (Last 24 hours) at 01/16/2024 0804 Last data filed at 01/15/2024 1920 Gross per 24 hour  Intake 480 ml  Output 300 ml  Net 180 ml    There were no vitals filed for this visit.  Examination: General exam: Appears calm and comfortable, NAD  Respiratory system: Some tachypnea, coughing, no wheeze, poor aeration bilaterally Cardiovascular system: Tachycardic  gastrointestinal system: Abdomen is nondistended, soft and nontender.  Neuro: Alert and oriented. No focal neurological deficits. Extremities: Symmetric, expected ROM Skin: No rashes, lesions Psychiatry: Demonstrates appropriate judgement and insight. Mood & affect appropriate for situation.   Assessment & Plan:  CHF - Patient endorses history of CHF, no echocardiogram on file - Takes 20 mg Lasix  daily at home - Currently on 40 mg IV twice daily, kidney function is tolerating - Strict I's and O's ordered, these are not being recorded appropriately.  Patient does endorse diuresis - Echocardiogram: Ordered pending. - Low-sodium diet - No significant edema seen on CTA to explain O2 requirement.  Atrial fibrillation - Intermittent throughout this admission.  New diagnosis - Initiate metoprolol  twice daily - Monitor on telemetry - Echocardiogram pending - Patient is a high fall risk but CHA2DS2-VASc is 5 - Will discuss risk-benefit of anticoagulation with patient pending results of echocardiogram. - May require ischemic workup.  Will follow echocardiogram and consult cardiology accordingly.  Acute hypoxic respiratory failure - Ongoing O2 requirement with tachypnea and cough on exam - Aeration better today, no appreciable wheezing - Is currently on steroids which  appear to be helping some - Will perform home O2 walk trial.  At baseline patient may require oxygen.  Significant tobacco abuse history  EtOH abuse - 3 shots whiskey daily - Patient is tremulous and tachycardic. - Tachycardia is intermittently atrial fibrillation so have initiated metoprolol  as above - Suspect tremor may be baseline - She was on scheduled Ativan  but has requested to discontinue  and proceed with as needed Ativan  only. - No signs of delirium. - Continue CIWA protocol - Continue monitor on telemetry - Continue thiamine  multivitamin  Tobacco abuse - Continue nicotine  patch   COPD - Currently requiring 2 L O2, no oxygen at home though he may actually have an O2 requirement he is unaware of - Currently on steroids, will continue - Continue with DuoNebs - Home O2 walk trial as above  - Resume home dose inhalers   Hypertension - Resume home meds   Scalp laceration - Repaired in ED with 1 staple - Remove stable outpatient within 1 week   Fall - Sustained fall off his kitchen chair where he was sleeping. - Head CT negative - PT/OT evals   DVT prophylaxis: Heparin     Code Status: Full Code Disposition: Currently on supplemental O2, actively withdrawing from alcohol.  Requiring Ativan .  A-fib workup.  Will require home health at discharge.  Not medically cleared yet  Consultants:    Procedures:    Antimicrobials:  Anti-infectives (From admission, onward)    None       Data Reviewed: I have personally reviewed following labs and imaging studies CBC: Recent Labs  Lab 01/14/24 0043 01/15/24 0554 01/16/24 0302  WBC 6.1 5.3 5.6  NEUTROABS  --   --  3.1  HGB 15.7 13.8 13.1  HCT 47.0 40.5 39.3  MCV 90.7 91.6 91.2  PLT 163 150 151   Basic Metabolic Panel: Recent Labs  Lab 01/14/24 0043 01/15/24 0554 01/16/24 0302  NA 138 137 136  K 4.1 3.0* 3.1*  CL 96* 97* 98  CO2 30 32 29  GLUCOSE 131* 107* 104*  BUN 8 10 11   CREATININE 0.93 0.74 0.88  CALCIUM  9.2 8.6* 8.6*  MG  --   --  2.0  PHOS  --   --  3.6   GFR: CrCl cannot be calculated (Unknown ideal weight.). Liver Function Tests: Recent Labs  Lab 01/14/24 0043 01/15/24 0554 01/16/24 0302  AST 20 17 16   ALT 17 15 15   ALKPHOS 73 51 51  BILITOT 1.1 1.4* 0.9  PROT 7.1 6.0* 6.4*  ALBUMIN 3.9 3.3* 3.4*   CBG: No results for input(s): GLUCAP in the last 168 hours.  Recent  Results (from the past 240 hours)  Resp panel by RT-PCR (RSV, Flu A&B, Covid) Anterior Nasal Swab     Status: None   Collection Time: 01/14/24 12:43 AM   Specimen: Anterior Nasal Swab  Result Value Ref Range Status   SARS Coronavirus 2 by RT PCR NEGATIVE NEGATIVE Final    Comment: (NOTE) SARS-CoV-2 target nucleic acids are NOT DETECTED.  The SARS-CoV-2 RNA is generally detectable in upper respiratory specimens during the acute phase of infection. The lowest concentration of SARS-CoV-2 viral copies this assay can detect is 138 copies/mL. A negative result does not preclude SARS-Cov-2 infection and should not be used as the sole basis for treatment or other patient management decisions. A negative result may occur with  improper specimen collection/handling, submission of specimen other than nasopharyngeal swab, presence of viral mutation(s) within the  areas targeted by this assay, and inadequate number of viral copies(<138 copies/mL). A negative result must be combined with clinical observations, patient history, and epidemiological information. The expected result is Negative.  Fact Sheet for Patients:  BloggerCourse.com  Fact Sheet for Healthcare Providers:  SeriousBroker.it  This test is no t yet approved or cleared by the [place]  FDA and  has been authorized for detection and/or diagnosis of SARS-CoV-2 by FDA under an Emergency Use Authorization (EUA). This EUA will remain  in effect (meaning this test can be used) for the duration of the COVID-19 declaration under Section 564(b)(1) of the Act, 21 U.S.C.section 360bbb-3(b)(1), unless the authorization is terminated  or revoked sooner.       Influenza A by PCR NEGATIVE NEGATIVE Final   Influenza B by PCR NEGATIVE NEGATIVE Final    Comment: (NOTE) The Xpert Xpress SARS-CoV-2/FLU/RSV plus assay is intended as an aid in the diagnosis of influenza from Nasopharyngeal swab  specimens and should not be used as a sole basis for treatment. Nasal washings and aspirates are unacceptable for Xpert Xpress SARS-CoV-2/FLU/RSV testing.  Fact Sheet for Patients: BloggerCourse.com  Fact Sheet for Healthcare Providers: SeriousBroker.it  This test is not yet approved or cleared by the [place]  FDA and has been authorized for detection and/or diagnosis of SARS-CoV-2 by FDA under an Emergency Use Authorization (EUA). This EUA will remain in effect (meaning this test can be used) for the duration of the COVID-19 declaration under Section 564(b)(1) of the Act, 21 U.S.C. section 360bbb-3(b)(1), unless the authorization is terminated or revoked.     Resp Syncytial Virus by PCR NEGATIVE NEGATIVE Final    Comment: (NOTE) Fact Sheet for Patients: BloggerCourse.com  Fact Sheet for Healthcare Providers: SeriousBroker.it  This test is not yet approved or cleared by the [place]  FDA and has been authorized for detection and/or diagnosis of SARS-CoV-2 by FDA under an Emergency Use Authorization (EUA). This EUA will remain in effect (meaning this test can be used) for the duration of the COVID-19 declaration under Section 564(b)(1) of the Act, 21 U.S.C. section 360bbb-3(b)(1), unless the authorization is terminated or revoked.  Performed at [facility], [address] [place], [place] [address]   Blood culture (routine x 2)     Status: None (Preliminary result)   Collection Time: 01/14/24  4:53 AM   Specimen: BLOOD  Result Value Ref Range Status   Specimen Description BLOOD BLOOD LEFT ARM  Final   Special Requests   Final    BOTTLES DRAWN AEROBIC AND ANAEROBIC Blood Culture adequate volume   Culture   Final    NO GROWTH 2 DAYS Performed at [facility], [address], [place], [place] [address]    Report Status PENDING  Incomplete   Blood culture (routine x 2)     Status: None (Preliminary result)   Collection Time: 01/14/24  4:54 AM   Specimen: BLOOD  Result Value Ref Range Status   Specimen Description BLOOD BLOOD RIGHT ARM  Final   Special Requests   Final    BOTTLES DRAWN AEROBIC AND ANAEROBIC Blood Culture adequate volume   Culture   Final    NO GROWTH 2 DAYS Performed at [facility], [address], [place], [place] [address]    Report Status PENDING  Incomplete     Radiology Studies: No results found.   Scheduled Meds:  irbesartan   150 mg Oral Daily   And   amLODipine   10 mg Oral Daily  arformoterol   15 mcg Nebulization BID   And   umeclidinium bromide   1 puff Inhalation Daily   folic acid   1 mg Oral Daily   furosemide   40 mg Intravenous BID   heparin  injection (subcutaneous)  5,000 Units Subcutaneous Q12H   ipratropium-albuterol   3 mL Nebulization BID   methylPREDNISolone  (SOLU-MEDROL ) injection  40 mg Intravenous Q12H   multivitamin with minerals  1 tablet Oral Daily   potassium chloride   40 mEq Oral Once   thiamine   100 mg Oral Daily   Or   thiamine   100 mg Intravenous Daily   Continuous Infusions:   LOS: 1 day  MDM: Patient is high risk for one or more organ failure.  They necessitate ongoing hospitalization for continued IV therapies and subsequent lab monitoring. Total time spent interpreting labs and vitals, reviewing the medical record, coordinating care amongst consultants and care team members, directly assessing and discussing care with the patient and/or family: 55 min  Colonel Krauser, DO [facility]  To contact the attending physician between 7A-7P please use Epic Chat. To contact the covering physician during after hours 7P-7A, please review Amion.  01/16/2024, 8:04 AM   *This document has been created with the assistance of dictation software. Please excuse typographical errors. *

## 2024-01-16 NOTE — Progress Notes (Signed)
*  PRELIMINARY RESULTS* Echocardiogram 2D Echocardiogram has been performed.  Bari BROCKS Grantley Savage 01/16/2024, 1:22 PM

## 2024-01-16 NOTE — Plan of Care (Signed)
  Problem: Education: Goal: Knowledge of General Education information will improve Description: Including pain rating scale, medication(s)/side effects and non-pharmacologic comfort measures Outcome: Not Progressing   Problem: Health Behavior/Discharge Planning: Goal: Ability to manage health-related needs will improve Outcome: Not Progressing   Problem: Clinical Measurements: Goal: Ability to maintain clinical measurements within normal limits will improve Outcome: Progressing Goal: Will remain free from infection Outcome: Progressing Goal: Diagnostic test results will improve Outcome: Progressing Goal: Respiratory complications will improve Outcome: Progressing Goal: Cardiovascular complication will be avoided Outcome: Progressing   Problem: Activity: Goal: Risk for activity intolerance will decrease Outcome: Progressing   Problem: Nutrition: Goal: Adequate nutrition will be maintained Outcome: Progressing   Problem: Coping: Goal: Level of anxiety will decrease Outcome: Progressing   Problem: Elimination: Goal: Will not experience complications related to bowel motility Outcome: Progressing Goal: Will not experience complications related to urinary retention Outcome: Progressing   Problem: Pain Managment: Goal: General experience of comfort will improve and/or be controlled Outcome: Progressing   Problem: Safety: Goal: Ability to remain free from injury will improve Outcome: Progressing   Problem: Skin Integrity: Goal: Risk for impaired skin integrity will decrease Outcome: Progressing

## 2024-01-16 NOTE — Progress Notes (Signed)
 Mobility Specialist - Progress Note    01/16/24 1411  Mobility  Activity Ambulated independently in hallway  Level of Assistance Modified independent, requires aide device or extra time  Assistive Device Wheelchair  Distance Ambulated (ft) 1000 ft  Range of Motion/Exercises Active  Activity Response Tolerated well  Mobility Referral Yes  Mobility visit 1 Mobility  Mobility Specialist Start Time (ACUTE ONLY) 1347  Mobility Specialist Stop Time (ACUTE ONLY) 1411  Mobility Specialist Time Calculation (min) (ACUTE ONLY) 24 min   Pt standing at door upon entry on RA. Pt ambulates to medical mall holding onto [facility] for support ModI. Pt returned to sitting EOB and left with needs in reach.   Guido Rumble Mobility Specialist 01/16/24, 2:39 PM

## 2024-01-17 ENCOUNTER — Telehealth (HOSPITAL_COMMUNITY): Payer: Self-pay

## 2024-01-17 ENCOUNTER — Encounter: Payer: Self-pay | Admitting: Family Medicine

## 2024-01-17 ENCOUNTER — Other Ambulatory Visit (HOSPITAL_COMMUNITY): Payer: Self-pay

## 2024-01-17 DIAGNOSIS — F10932 Alcohol use, unspecified with withdrawal with perceptual disturbance: Secondary | ICD-10-CM | POA: Diagnosis not present

## 2024-01-17 DIAGNOSIS — I10 Essential (primary) hypertension: Secondary | ICD-10-CM | POA: Diagnosis not present

## 2024-01-17 DIAGNOSIS — I4891 Unspecified atrial fibrillation: Secondary | ICD-10-CM | POA: Diagnosis not present

## 2024-01-17 DIAGNOSIS — I5021 Acute systolic (congestive) heart failure: Secondary | ICD-10-CM | POA: Diagnosis not present

## 2024-01-17 LAB — CBC WITH DIFFERENTIAL/PLATELET
Abs Immature Granulocytes: 0.02 K/uL (ref 0.00–0.07)
Basophils Absolute: 0 K/uL (ref 0.0–0.1)
Basophils Relative: 0 %
Eosinophils Absolute: 0 K/uL (ref 0.0–0.5)
Eosinophils Relative: 0 %
HCT: 39.7 % (ref 39.0–52.0)
Hemoglobin: 13.5 g/dL (ref 13.0–17.0)
Immature Granulocytes: 0 %
Lymphocytes Relative: 19 %
Lymphs Abs: 1.3 K/uL (ref 0.7–4.0)
MCH: 30.5 pg (ref 26.0–34.0)
MCHC: 34 g/dL (ref 30.0–36.0)
MCV: 89.8 fL (ref 80.0–100.0)
Monocytes Absolute: 0.7 K/uL (ref 0.1–1.0)
Monocytes Relative: 9 %
Neutro Abs: 4.9 K/uL (ref 1.7–7.7)
Neutrophils Relative %: 72 %
Platelets: 145 K/uL — ABNORMAL LOW (ref 150–400)
RBC: 4.42 MIL/uL (ref 4.22–5.81)
RDW: 13.4 % (ref 11.5–15.5)
WBC: 6.9 K/uL (ref 4.0–10.5)
nRBC: 0 % (ref 0.0–0.2)

## 2024-01-17 LAB — COMPREHENSIVE METABOLIC PANEL WITH GFR
ALT: 16 U/L (ref 0–44)
AST: 17 U/L (ref 15–41)
Albumin: 3.6 g/dL (ref 3.5–5.0)
Alkaline Phosphatase: 49 U/L (ref 38–126)
Anion gap: 8 (ref 5–15)
BUN: 17 mg/dL (ref 8–23)
CO2: 29 mmol/L (ref 22–32)
Calcium: 9.1 mg/dL (ref 8.9–10.3)
Chloride: 97 mmol/L — ABNORMAL LOW (ref 98–111)
Creatinine, Ser: 0.92 mg/dL (ref 0.61–1.24)
GFR, Estimated: >60 mL/min (ref >60)
Glucose, Bld: 116 mg/dL — ABNORMAL HIGH (ref 70–99)
Potassium: 3.7 mmol/L (ref 3.5–5.1)
Sodium: 134 mmol/L — ABNORMAL LOW (ref 135–145)
Total Bilirubin: 1 mg/dL (ref 0.0–1.2)
Total Protein: 6.7 g/dL (ref 6.5–8.1)

## 2024-01-17 LAB — LIPID PANEL
Cholesterol: 128 mg/dL (ref 0–200)
HDL: 62 mg/dL (ref >40)
LDL Cholesterol: 55 mg/dL (ref 0–99)
Total CHOL/HDL Ratio: 2.1 ratio
Triglycerides: 53 mg/dL (ref <150)
VLDL: 11 mg/dL (ref 0–40)

## 2024-01-17 LAB — PHOSPHORUS: Phosphorus: 2.9 mg/dL (ref 2.5–4.6)

## 2024-01-17 LAB — MAGNESIUM: Magnesium: 2 mg/dL (ref 1.7–2.4)

## 2024-01-17 LAB — HEPARIN LEVEL (UNFRACTIONATED): Heparin Unfractionated: 0.16 [IU]/mL — ABNORMAL LOW (ref 0.30–0.70)

## 2024-01-17 MED ORDER — HEPARIN BOLUS VIA INFUSION
4700.0000 [IU] | Freq: Once | INTRAVENOUS | Status: AC
  Administered 2024-01-17: 4700 [IU] via INTRAVENOUS
  Filled 2024-01-17: qty 4700

## 2024-01-17 MED ORDER — SODIUM CHLORIDE 0.9% FLUSH
10.0000 mL | Freq: Two times a day (BID) | INTRAVENOUS | Status: DC
  Administered 2024-01-18 – 2024-01-20 (×5): 10 mL

## 2024-01-17 MED ORDER — HEPARIN BOLUS VIA INFUSION
2500.0000 [IU] | Freq: Once | INTRAVENOUS | Status: AC
  Administered 2024-01-18: 2500 [IU] via INTRAVENOUS
  Filled 2024-01-17: qty 2500

## 2024-01-17 MED ORDER — HEPARIN (PORCINE) 25000 UT/250ML-% IV SOLN
1450.0000 [IU]/h | INTRAVENOUS | Status: DC
  Administered 2024-01-17: 1150 [IU]/h via INTRAVENOUS
  Administered 2024-01-18: 1450 [IU]/h via INTRAVENOUS
  Filled 2024-01-17 (×2): qty 250

## 2024-01-17 MED ORDER — PREDNISONE 20 MG PO TABS
20.0000 mg | ORAL_TABLET | Freq: Every day | ORAL | Status: DC
  Administered 2024-01-18 – 2024-01-20 (×3): 20 mg via ORAL
  Filled 2024-01-17 (×3): qty 1

## 2024-01-17 MED ORDER — CHLORHEXIDINE GLUCONATE CLOTH 2 % EX PADS
6.0000 | MEDICATED_PAD | Freq: Every day | CUTANEOUS | Status: DC
  Administered 2024-01-19 – 2024-01-20 (×2): 6 via TOPICAL

## 2024-01-17 NOTE — Consult Note (Addendum)
 Cardiology Consultation   Patient ID: Terry Garcia MRN: [id number]; DOB: 1945/08/20  Admit date: 01/14/2024 Date of Consult: 01/17/2024  PCP:  Loreli Kins, MD   [facility] Providers Cardiologist:  None        Patient Profile: Terry Garcia is a 78 y.o. male with a hx of COPD, tobacco use, CHF with no prior echo on Lasix , alcohol abuse, hypertension,  RBBB, and aortic atherosclerosis who is being seen 01/17/2024 for the evaluation of acute CHF exacerbation at the request of Dr. Leesa.  History of Present Illness: Terry Garcia originally reported to the ED after falling asleep at the dinner table and falling onto the floor, causing abrasions to the head and left arm.  Upon further evaluation, he reported several days of worsening shortness of breath with exertion, which was limiting his activity.  He reports that he was seen by his primary care provider earlier this spring and had reported worsening lower extremity edema.  He was diagnosed with heart failure and started on Lasix  20 mg daily.  Echo was not obtained.  He was referred to cardiology.  However, he could not get an appointment until October so has not yet established care. He reports intermittent feelings of palpitations.  He denies chest pain, lightheadedness, dizziness, orthopnea, and PND. He denies bleeding and hematochezia. He reports tobacco use of 1/2-3/4 ppd and daily alcohol use of 1-3 drinks per day. He denies illicit drug use. He reports family history significant for father with heart attack in his 34s and sister with heart attack in her 53s. He denies any personal history of heart disease and has never seen a cardiologist.   In the ED, BP 97/81, HR 119 with otherwise normal vital signs. Pertinent labs include glucose 132, BNP 401. Ddimer was elevated. CTA chest without signs of PE and positive for COPD and trace R pleural effusion.  CXR also exhibiting COPD. CT head negative for acute intracranial abnormality.  EKG showed atrial fibrillation with rate 112 bpm, no acute ischemic changes. Echo was obtained and revealed reduced EF of 35-40%. Cardiology was asked to consult for further evaluation of new heart failure. At time of cardiology consult, patient reports improvements in shortness of breath and lower extremity swelling. He is without symptoms of angina and cardiac decompensation.   Past Medical History:  Diagnosis Date   Arterial atherosclerosis    per pt   COPD (chronic obstructive pulmonary disease) (HCC)    controlled with daily inhaler and albuterol  as needed   GERD (gastroesophageal reflux disease)    no meds needed   History of kidney stones    needed open abdominal surgery for stone in 1978, no stones since   Hypertension    amlodipine  and lisinopril/HCTZ   Right bundle branch block    per pt, managed by primary, does not see cardiologist    Past Surgical History:  Procedure Laterality Date   CYST REMOVAL LEG  1980   removal of cyst on knee cap    INSERTION OF MESH N/A 07/13/2018   Procedure: INSERTION OF MESH;  Surgeon: Vanderbilt Ned, MD;  Location: [facility];  Service: General;  Laterality: N/A;   KIDNEY SURGERY  1978   needed open abdominal surgery to remove stone   PILONIDAL CYST EXCISION  1970   UMBILICAL HERNIA REPAIR N/A 07/13/2018   Procedure: UMBILICAL HERNIA REPAIR WITH MESH;  Surgeon: Vanderbilt Ned, MD;  Location: [facility];  Service: General;  Laterality: N/A;  Scheduled Meds:  irbesartan   150 mg Oral Daily   And   amLODipine   10 mg Oral Daily   arformoterol   15 mcg Nebulization BID   And   umeclidinium bromide   1 puff Inhalation Daily   folic acid   1 mg Oral Daily   furosemide   40 mg Intravenous BID   heparin  injection (subcutaneous)  5,000 Units Subcutaneous Q12H   ipratropium-albuterol   3 mL Nebulization BID   metoprolol  tartrate  25 mg Oral BID   multivitamin with minerals  1 tablet Oral Daily   thiamine   100 mg  Oral Daily   Or   thiamine   100 mg Intravenous Daily   Continuous Infusions:  PRN Meds: ondansetron  **OR** ondansetron  (ZOFRAN ) IV, senna-docusate  Allergies:    Allergies  Allergen Reactions   Budeson-Glycopyrrol-Formoterol     Other Reaction(s): tongue swelling   Budesonide-Formoterol Fumarate     Other Reaction(s): ineffective   Advair Diskus [Fluticasone-Salmeterol] Other (See Comments)    Pt states gave him the shakes   Levaquin [Levofloxacin] Other (See Comments)    Pt states made him feel extreme anger     Social History:   Social History   Socioeconomic History   Marital status: Single    Spouse name: Not on file   Number of children: Not on file   Years of education: Not on file   Highest education level: Not on file  Occupational History   Not on file  Tobacco Use   Smoking status: Every Day    Current packs/day: 0.50    Types: Cigarettes   Smokeless tobacco: Never   Tobacco comments:    one pack lasts 41 hours  Substance and Sexual Activity   Alcohol use: Yes    Comment: one shot of liquor or beer daily   Drug use: Not Currently   Sexual activity: Not on file  Other Topics Concern   Not on file  Social History Narrative   Not on file   Social Drivers of Health   Financial Resource Strain: Not on file  Food Insecurity: No Food Insecurity (01/15/2024)   Hunger Vital Sign    Worried About Running Out of Food in the Last Year: Never true    Ran Out of Food in the Last Year: Never true  Transportation Needs: No Transportation Needs (01/15/2024)   PRAPARE - Administrator, Civil Service (Medical): No    Lack of Transportation (Non-Medical): No  Physical Activity: Not on file  Stress: Not on file  Social Connections: Moderately Isolated (01/15/2024)   Social Connection and Isolation Panel    Frequency of Communication with Friends and Family: Three times a week    Frequency of Social Gatherings with Friends and Family: Once a week     Attends Religious Services: Never    Database administrator or Organizations: No    Attends Banker Meetings: Never    Marital Status: Living with partner  Intimate Partner Violence: Not At Risk (01/15/2024)   Humiliation, Afraid, Rape, and Kick questionnaire    Fear of Current or Ex-Partner: No    Emotionally Abused: No    Physically Abused: No    Sexually Abused: No    Family History:   History reviewed. No pertinent family history.   ROS:  Please see the history of present illness.   All other ROS reviewed and negative.     Physical Exam/Data: Vitals:   01/17/24 0221 01/17/24 0609 01/17/24 0725 01/17/24  0758  BP: 91/81 101/84  91/77  Pulse: (!) 106 (!) 104  63  Resp:    16  Temp: 97.7 F (36.5 C) 97.8 F (36.6 C)  98.1 F (36.7 C)  TempSrc:    Oral  SpO2: 94% 91% 92% 92%    Intake/Output Summary (Last 24 hours) at 01/17/2024 0935 Last data filed at 01/17/2024 0222 Gross per 24 hour  Intake 717 ml  Output --  Net 717 ml      07/13/2018    8:22 AM 07/04/2018   10:11 AM  Last 3 Weights  Weight (lbs) 201 lb 1 oz 202 lb  Weight (kg) 91.2 kg 91.627 kg     There is no height or weight on file to calculate BMI.  General:  Well nourished, well developed, in no acute distress HEENT: normal Neck: no JVD Vascular: No carotid bruits; Distal pulses 2+ bilaterally Cardiac: IRIR; normal S1, S2; no murmur Lungs:  clear to auscultation bilaterally, no wheezing, rhonchi or rales  Abd: soft, nontender, no hepatomegaly  Ext: no edema; stasis dermatitis noted Skin: warm and dry  Psych:  Normal affect   EKG:  The EKG was personally reviewed and demonstrates:  atrial fibrillation with RBBB, rate 112 bpm Telemetry:  Telemetry was personally reviewed and demonstrates:  Atrial fibrillation vs MAT with rate 90-120 bpm  Relevant CV Studies:  01/16/2024 Echo complete 1. Left ventricular ejection fraction, by estimation, is 35 to 40%. The left ventricle has moderately  decreased function. The left ventricle demonstrates global hypokinesis. Left ventricular diastolic parameters are consistent with Grade I diastolic dysfunction (impaired relaxation).   2. Right ventricular systolic function is mildly reduced. The right ventricular size is mildly enlarged. Mildly increased right ventricular wall thickness.   3. Left atrial size was mildly dilated.   4. Right atrial size was moderately dilated.   5. The mitral valve is normal in structure. No evidence of mitral valve regurgitation.   6. The aortic valve is normal in structure. Aortic valve regurgitation is not visualized.   Laboratory Data: High Sensitivity Troponin:   Recent Labs  Lab 01/14/24 0043 01/14/24 0452  TROPONINIHS 25* 27*     Chemistry Recent Labs  Lab 01/15/24 0554 01/16/24 0302 01/17/24 0611  NA 137 136 134*  K 3.0* 3.1* 3.7  CL 97* 98 97*  CO2 32 29 29  GLUCOSE 107* 104* 116*  BUN 10 11 17   CREATININE 0.74 0.88 0.92  CALCIUM  8.6* 8.6* 9.1  MG  --  2.0 2.0  GFRNONAA >60 >60 >60  ANIONGAP 8 9 8     Recent Labs  Lab 01/15/24 0554 01/16/24 0302 01/17/24 0611  PROT 6.0* 6.4* 6.7  ALBUMIN 3.3* 3.4* 3.6  AST 17 16 17   ALT 15 15 16   ALKPHOS 51 51 49  BILITOT 1.4* 0.9 1.0   Lipids No results for input(s): CHOL, TRIG, HDL, LABVLDL, LDLCALC, CHOLHDL in the last 168 hours.  Hematology Recent Labs  Lab 01/15/24 0554 01/16/24 0302 01/17/24 0611  WBC 5.3 5.6 6.9  RBC 4.42 4.31 4.42  HGB 13.8 13.1 13.5  HCT 40.5 39.3 39.7  MCV 91.6 91.2 89.8  MCH 31.2 30.4 30.5  MCHC 34.1 33.3 34.0  RDW 13.7 13.7 13.4  PLT 150 151 145*   Thyroid No results for input(s): TSH, FREET4 in the last 168 hours.  BNP Recent Labs  Lab 01/14/24 0454  BNP 401.0*    DDimer  Recent Labs  Lab 01/14/24 0454  DDIMER 1.19*  Radiology/Studies:  CT Angio Chest PE W/Cm &/Or Wo Cm Result Date: 01/14/2024 IMPRESSION: 1. No evidence of arterial embolus. 2. Prominent pulmonary trunk  and main pulmonary arteries indicating arterial hypertension. 3. Mild cardiomegaly with slightly distended central pulmonary veins and asymmetric prominence of the right chambers. 4. Aortic and coronary artery atherosclerosis. 5. COPD. 6. Diffuse bronchial thickening with posterior basal segmental bronchial impactions in the right lower lobe and bilateral scattered subsegmental bronchial impactions in both lower lobes. 7. Trace right pleural effusion.  No focal infiltrate. 8. Osteopenia, kyphosis and degenerative change of the thoracic spine. 9. Chronic appearing T12 compression fracture. 10. Capsular nodularity suspected over portions of the dorsal liver surface, may be seen with early cirrhosis. Aortic Atherosclerosis (ICD10-I70.0) and Emphysema (ICD10-J43.9). Electronically Signed   By: Francis Quam M.D.   On: 01/14/2024 07:12   CT Head Wo Contrast Result Date: 01/14/2024 CLINICAL DATA:  Fall EXAM: CT HEAD WITHOUT IMPRESSION: Atrophy, chronic microvascular disease. No acute intracranial abnormality. Electronically Signed   By: Franky Crease M.D.   On: 01/14/2024 02:02   DG Chest Portable 1 View Result Date: 01/14/2024 IMPRESSION: COPD. Borderline cardiomegaly. No active disease. Electronically Signed   By: Franky Crease M.D.   On: 01/14/2024 01:03   Assessment and Plan:  Newly diagnosed HFrEF - Patient presented 7/11 after mechanical fall at home, worsening shortness of breath for the past several days in addition to several month history of lower extremity edema.  Was reportedly diagnosed with CHF by his PCP, started on Lasix , and referred to cardiology. Echo was not obtained. He was not able to get an appointment until October and has not yet been seen. - Echo this admission shows EF 35 to 40% with global hypokinesis, G1 DD, mildly reduced RV function - Has been diuresed during admission with IV Lasix  40 mg twice daily with poorly recorded I/Os and no weights recorded - Appears euvolemic on exam,  will discontinue Lasix  for now with Cr trending up - Continue to monitor kidney function, strict I/Os, and daily weights with ongoing diuresis - Continue irbesartan  150 mg daily and metoprolol  tartrate 25 mg twice daily - Discontinue amlodipine  in the setting of borderline hypotension - Continue to progress GDMT as kidney function and BP allow - In the setting of newly reduced EF and several risk factors for CAD, recommend proceeding with R/LHC tentatively scheduled for tomorrow. Patient should be NPO after midnight.   Informed Consent   Shared Decision Making/Informed Consent The risks [stroke (1 in 1000), death (1 in 1000), kidney failure [usually temporary] (1 in 500), bleeding (1 in 200), allergic reaction [possibly serious] (1 in 200)], benefits (diagnostic support and management of coronary artery disease) and alternatives of a cardiac catheterization were discussed in detail with Terry Garcia and he is willing to proceed.     New onset atrial fibrillation - No known history of atrial fibrillation - Telemetry during admission reveals rhythm concerning for atrial fibrillation vs multifocal atrial tachycardia with rates 90-120 bpm - Recommend repletion for K > 4, mag wnl - Start IV heparin  for anticoagulation in the setting of CHA2DS2VASc at least 5. Will need to discuss risk vs benefit of long term anticoagulation given recurrent falls and alcohol abuse.  - Continue metoprolol  tartrate 25 mg twice daily  Hypertension - BP borderline low - Discontinue amlodipine  - Continue irbesartan  and metoprolol  as above  COPD - Longstanding history - No longer requiring supplemental O2 - Ongoing management per IM  Alcohol abuse -  May be contributing some to tachycardia - CIWA protocol per IM  Tobacco use - Continue nicotine  patch - Recommend cessation  Fall Scalp laceration - Originally presenting after fall from chair while sleeping - CT head negative - Scalp laceration repaired with  staple in ED  Risk Assessment/Risk Scores:  [place]  Heart Association (NYHA) Functional Class NYHA Class II  CHA2DS2-VASc Score = 5   This indicates a 7.2% annual risk of stroke. The patient's score is based upon: CHF History: 1 HTN History: 1 Diabetes History: 0 Stroke History: 0 Vascular Disease History: 1 Age Score: 2 Gender Score: 0     For questions or updates, please contact [facility] Please consult [email or web address] for contact info under    Signed, Lesley LITTIE Maffucci, PA-C  01/17/2024 9:35 AM

## 2024-01-17 NOTE — Progress Notes (Signed)
 Mobility Specialist - Progress Note    01/17/24 1400  Mobility  Activity Stood at bedside;Dangled on edge of bed;Ambulated independently in hallway  Level of Assistance Modified independent, requires aide device or extra time  Assistive Device Wheelchair  Distance Ambulated (ft) 1000 ft  Range of Motion/Exercises Active  Activity Response Tolerated well  Mobility Referral Yes  Mobility visit 1 Mobility  Mobility Specialist Start Time (ACUTE ONLY) 1236  Mobility Specialist Stop Time (ACUTE ONLY) 1300  Mobility Specialist Time Calculation (min) (ACUTE ONLY) 24 min   Pt resting in bed on RA upon entry. Pt STS and ambulates to hallway around NS and out to medical mall ModI holding onto the back of wheelchair for additional stability. Pt maintains steady gait throughout session with no needs or distress. Pt returned to bed and left with needs in reach.   Guido Rumble Mobility Specialist 01/17/24, 3:00 PM

## 2024-01-17 NOTE — Telephone Encounter (Signed)
 Pharmacy Patient Advocate Encounter  Insurance verification completed.    The patient is insured through [employer]. Patient has Medicare and is not eligible for a copay card, but may be able to apply for patient assistance or Medicare RX Payment Plan (Patient Must reach out to their plan, if eligible for payment plan), if available.    Ran test claim for Farxiga  and the current 30 day co-pay is $0.00.  Ran test claim for Jardiance and the current 30 day co-pay is $0.00.  Ran test claim for Entresto and the current 30 day co-pay is $0.00.  This test claim was processed through [facility]- copay amounts may vary at other pharmacies due to pharmacy/plan contracts, or as the patient moves through the different stages of their insurance plan.

## 2024-01-17 NOTE — Progress Notes (Signed)
 OT Cancellation Note  Patient Details Name: KETAN RENZ MRN: [id number] DOB: [birth year]   Cancelled Treatment:    Reason Eval/Treat Not Completed: Other (comment). Upon attempt, pt/family meeting with staff in room. Will re-attempt OT tx at later date/time as pt is available.   Gaynor Ferreras R., MPH, MS, OTR/L ascom [phone] 01/17/24, 3:35 PM

## 2024-01-17 NOTE — Plan of Care (Signed)
 Pt was educated on why he needs tele box and it was placed and pt did not remove it during shift.

## 2024-01-17 NOTE — Progress Notes (Signed)

## 2024-01-17 NOTE — TOC Initial Note (Signed)
 Transition of Care [facility]) - Initial/Assessment Note    Patient Details  Name: Terry Garcia MRN: [id number] Date of Birth: [birth year]  [facility] CM/SW Contact:    Terry Garcia Phone Number: 01/17/2024, 1:41 PM  Clinical Narrative:                  Patient is from home independently. His wife and step son live in the home. He no longer drives, but his son lives near by and is able to take him places. His PCP is Dr. Cecilio and he uses [facility]. He has DME: walker. His son will transport him at discharge. The patient has not had HH in the past, but would like to use a family friend. The patient will get the name of the agency.  TOC will continue to follow Expected Discharge Plan: Home w Home Health Services Barriers to Discharge: Continued Medical Work up   Patient Goals and CMS Choice     Choice offered to / list presented to : Patient      Expected Discharge Plan and Services   Discharge Planning Services: CM Consult Post Acute Care Choice: Home Health Living arrangements for the past 2 months: Single Family Home                                      Prior Living Arrangements/Services Living arrangements for the past 2 months: Single Family Home Lives with:: Adult Children, Spouse   Do you feel safe going back to the place where you live?: Yes          Current home services: DME (cane)    Activities of Daily Living   ADL Screening (condition at time of admission) Independently performs ADLs?: Yes (appropriate for developmental age) Is the patient deaf or have difficulty hearing?: No Does the patient have difficulty seeing, even when wearing glasses/contacts?: No Does the patient have difficulty concentrating, remembering, or making decisions?: No  Permission Sought/Granted                  Emotional Assessment Appearance:: Appears stated age Attitude/Demeanor/Rapport: Engaged Affect (typically observed):  Appropriate Orientation: : Oriented to Self, Oriented to Place, Oriented to  Time, Oriented to Situation   Psych Involvement: No (comment)  Admission diagnosis:  Alcohol withdrawal (HCC) [F10.939] Orthopnea [R06.01] Tachycardia [R00.0] Exertional dyspnea [R06.09] Alcohol use [F10.90] CHF exacerbation (HCC) [I50.9] Fall, initial encounter [W19.XXXA] Skin tear of forearm without complication, left, initial encounter [S51.812A] Traumatic injury of head, initial encounter [S09.90XA] Acute on chronic congestive heart failure, unspecified heart failure type (HCC) [I50.9] Stapled skin wound [T14.8XXA] Patient Active Problem List   Diagnosis Date Noted   CHF exacerbation (HCC) 01/14/2024   Alcohol withdrawal (HCC) 01/14/2024   PCP:  Terry Kins, MD Pharmacy:   [facility] - [place], [place] - [address] Terry Garcia [place] [address] Phone: [phone] Fax: [phone]     Social Drivers of Health (SDOH) Social History: SDOH Screenings   Food Insecurity: No Food Insecurity (01/15/2024)  Housing: Low Risk  (01/15/2024)  Transportation Needs: No Transportation Needs (01/15/2024)  Utilities: Not At Risk (01/15/2024)  Social Connections: Moderately Isolated (01/15/2024)  Tobacco Use: High Risk (01/15/2024)   SDOH Interventions:     Readmission Risk Interventions     No data to display

## 2024-01-17 NOTE — H&P (View-Only) (Signed)
 Cardiology Consultation   Patient ID: Terry Garcia MRN: [id number]; DOB: 1945/08/20  Admit date: 01/14/2024 Date of Consult: 01/17/2024  PCP:  Loreli Kins, MD   Uncertain HeartCare Providers Cardiologist:  None        Patient Profile: Terry Garcia is a 78 y.o. male with a hx of COPD, tobacco use, CHF with no prior echo on Lasix , alcohol abuse, hypertension,  RBBB, and aortic atherosclerosis who is being seen 01/17/2024 for the evaluation of acute CHF exacerbation at the request of Dr. Leesa.  History of Present Illness: Terry Garcia originally reported to the ED after falling asleep at the dinner table and falling onto the floor, causing abrasions to the head and left arm.  Upon further evaluation, he reported several days of worsening shortness of breath with exertion, which was limiting his activity.  He reports that he was seen by his primary care provider earlier this spring and had reported worsening lower extremity edema.  He was diagnosed with heart failure and started on Lasix  20 mg daily.  Echo was not obtained.  He was referred to cardiology.  However, he could not get an appointment until October so has not yet established care. He reports intermittent feelings of palpitations.  He denies chest pain, lightheadedness, dizziness, orthopnea, and PND. He denies bleeding and hematochezia. He reports tobacco use of 1/2-3/4 ppd and daily alcohol use of 1-3 drinks per day. He denies illicit drug use. He reports family history significant for father with heart attack in his 34s and sister with heart attack in her 53s. He denies any personal history of heart disease and has never seen a cardiologist.   In the ED, BP 97/81, HR 119 with otherwise normal vital signs. Pertinent labs include glucose 132, BNP 401. Ddimer was elevated. CTA chest without signs of PE and positive for COPD and trace R pleural effusion.  CXR also exhibiting COPD. CT head negative for acute intracranial abnormality.  EKG showed atrial fibrillation with rate 112 bpm, no acute ischemic changes. Echo was obtained and revealed reduced EF of 35-40%. Cardiology was asked to consult for further evaluation of new heart failure. At time of cardiology consult, patient reports improvements in shortness of breath and lower extremity swelling. He is without symptoms of angina and cardiac decompensation.   Past Medical History:  Diagnosis Date   Arterial atherosclerosis    per pt   COPD (chronic obstructive pulmonary disease) (HCC)    controlled with daily inhaler and albuterol  as needed   GERD (gastroesophageal reflux disease)    no meds needed   History of kidney stones    needed open abdominal surgery for stone in 1978, no stones since   Hypertension    amlodipine  and lisinopril/HCTZ   Right bundle branch block    per pt, managed by primary, does not see cardiologist    Past Surgical History:  Procedure Laterality Date   CYST REMOVAL LEG  1980   removal of cyst on knee cap    INSERTION OF MESH N/A 07/13/2018   Procedure: INSERTION OF MESH;  Surgeon: Vanderbilt Ned, MD;  Location: [facility];  Service: General;  Laterality: N/A;   KIDNEY SURGERY  1978   needed open abdominal surgery to remove stone   PILONIDAL CYST EXCISION  1970   UMBILICAL HERNIA REPAIR N/A 07/13/2018   Procedure: UMBILICAL HERNIA REPAIR WITH MESH;  Surgeon: Vanderbilt Ned, MD;  Location: [facility];  Service: General;  Laterality: N/A;  Scheduled Meds:  irbesartan   150 mg Oral Daily   And   amLODipine   10 mg Oral Daily   arformoterol   15 mcg Nebulization BID   And   umeclidinium bromide   1 puff Inhalation Daily   folic acid   1 mg Oral Daily   furosemide   40 mg Intravenous BID   heparin  injection (subcutaneous)  5,000 Units Subcutaneous Q12H   ipratropium-albuterol   3 mL Nebulization BID   metoprolol  tartrate  25 mg Oral BID   multivitamin with minerals  1 tablet Oral Daily   thiamine   100 mg  Oral Daily   Or   thiamine   100 mg Intravenous Daily   Continuous Infusions:  PRN Meds: ondansetron  **OR** ondansetron  (ZOFRAN ) IV, senna-docusate  Allergies:    Allergies  Allergen Reactions   Budeson-Glycopyrrol-Formoterol     Other Reaction(s): tongue swelling   Budesonide-Formoterol Fumarate     Other Reaction(s): ineffective   Advair Diskus [Fluticasone-Salmeterol] Other (See Comments)    Pt states gave him the shakes   Levaquin [Levofloxacin] Other (See Comments)    Pt states made him feel extreme anger     Social History:   Social History   Socioeconomic History   Marital status: Single    Spouse name: Not on file   Number of children: Not on file   Years of education: Not on file   Highest education level: Not on file  Occupational History   Not on file  Tobacco Use   Smoking status: Every Day    Current packs/day: 0.50    Types: Cigarettes   Smokeless tobacco: Never   Tobacco comments:    one pack lasts 41 hours  Substance and Sexual Activity   Alcohol use: Yes    Comment: one shot of liquor or beer daily   Drug use: Not Currently   Sexual activity: Not on file  Other Topics Concern   Not on file  Social History Narrative   Not on file   Social Drivers of Health   Financial Resource Strain: Not on file  Food Insecurity: No Food Insecurity (01/15/2024)   Hunger Vital Sign    Worried About Running Out of Food in the Last Year: Never true    Ran Out of Food in the Last Year: Never true  Transportation Needs: No Transportation Needs (01/15/2024)   PRAPARE - Administrator, Civil Service (Medical): No    Lack of Transportation (Non-Medical): No  Physical Activity: Not on file  Stress: Not on file  Social Connections: Moderately Isolated (01/15/2024)   Social Connection and Isolation Panel    Frequency of Communication with Friends and Family: Three times a week    Frequency of Social Gatherings with Friends and Family: Once a week     Attends Religious Services: Never    Database administrator or Organizations: No    Attends Banker Meetings: Never    Marital Status: Living with partner  Intimate Partner Violence: Not At Risk (01/15/2024)   Humiliation, Afraid, Rape, and Kick questionnaire    Fear of Current or Ex-Partner: No    Emotionally Abused: No    Physically Abused: No    Sexually Abused: No    Family History:   History reviewed. No pertinent family history.   ROS:  Please see the history of present illness.   All other ROS reviewed and negative.     Physical Exam/Data: Vitals:   01/17/24 0221 01/17/24 0609 01/17/24 0725 01/17/24  0758  BP: 91/81 101/84  91/77  Pulse: (!) 106 (!) 104  63  Resp:    16  Temp: 97.7 F (36.5 C) 97.8 F (36.6 C)  98.1 F (36.7 C)  TempSrc:    Oral  SpO2: 94% 91% 92% 92%    Intake/Output Summary (Last 24 hours) at 01/17/2024 0935 Last data filed at 01/17/2024 0222 Gross per 24 hour  Intake 717 ml  Output --  Net 717 ml      07/13/2018    8:22 AM 07/04/2018   10:11 AM  Last 3 Weights  Weight (lbs) 201 lb 1 oz 202 lb  Weight (kg) 91.2 kg 91.627 kg     There is no height or weight on file to calculate BMI.  General:  Well nourished, well developed, in no acute distress HEENT: normal Neck: no JVD Vascular: No carotid bruits; Distal pulses 2+ bilaterally Cardiac: IRIR; normal S1, S2; no murmur Lungs:  clear to auscultation bilaterally, no wheezing, rhonchi or rales  Abd: soft, nontender, no hepatomegaly  Ext: no edema; stasis dermatitis noted Skin: warm and dry  Psych:  Normal affect   EKG:  The EKG was personally reviewed and demonstrates:  atrial fibrillation with RBBB, rate 112 bpm Telemetry:  Telemetry was personally reviewed and demonstrates:  Atrial fibrillation vs MAT with rate 90-120 bpm  Relevant CV Studies:  01/16/2024 Echo complete 1. Left ventricular ejection fraction, by estimation, is 35 to 40%. The left ventricle has moderately  decreased function. The left ventricle demonstrates global hypokinesis. Left ventricular diastolic parameters are consistent with Grade I diastolic dysfunction (impaired relaxation).   2. Right ventricular systolic function is mildly reduced. The right ventricular size is mildly enlarged. Mildly increased right ventricular wall thickness.   3. Left atrial size was mildly dilated.   4. Right atrial size was moderately dilated.   5. The mitral valve is normal in structure. No evidence of mitral valve regurgitation.   6. The aortic valve is normal in structure. Aortic valve regurgitation is not visualized.   Laboratory Data: High Sensitivity Troponin:   Recent Labs  Lab 01/14/24 0043 01/14/24 0452  TROPONINIHS 25* 27*     Chemistry Recent Labs  Lab 01/15/24 0554 01/16/24 0302 01/17/24 0611  NA 137 136 134*  K 3.0* 3.1* 3.7  CL 97* 98 97*  CO2 32 29 29  GLUCOSE 107* 104* 116*  BUN 10 11 17   CREATININE 0.74 0.88 0.92  CALCIUM  8.6* 8.6* 9.1  MG  --  2.0 2.0  GFRNONAA >60 >60 >60  ANIONGAP 8 9 8     Recent Labs  Lab 01/15/24 0554 01/16/24 0302 01/17/24 0611  PROT 6.0* 6.4* 6.7  ALBUMIN 3.3* 3.4* 3.6  AST 17 16 17   ALT 15 15 16   ALKPHOS 51 51 49  BILITOT 1.4* 0.9 1.0   Lipids No results for input(s): CHOL, TRIG, HDL, LABVLDL, LDLCALC, CHOLHDL in the last 168 hours.  Hematology Recent Labs  Lab 01/15/24 0554 01/16/24 0302 01/17/24 0611  WBC 5.3 5.6 6.9  RBC 4.42 4.31 4.42  HGB 13.8 13.1 13.5  HCT 40.5 39.3 39.7  MCV 91.6 91.2 89.8  MCH 31.2 30.4 30.5  MCHC 34.1 33.3 34.0  RDW 13.7 13.7 13.4  PLT 150 151 145*   Thyroid No results for input(s): TSH, FREET4 in the last 168 hours.  BNP Recent Labs  Lab 01/14/24 0454  BNP 401.0*    DDimer  Recent Labs  Lab 01/14/24 0454  DDIMER 1.19*  Radiology/Studies:  CT Angio Chest PE W/Cm &/Or Wo Cm Result Date: 01/14/2024 IMPRESSION: 1. No evidence of arterial embolus. 2. Prominent pulmonary trunk  and main pulmonary arteries indicating arterial hypertension. 3. Mild cardiomegaly with slightly distended central pulmonary veins and asymmetric prominence of the right chambers. 4. Aortic and coronary artery atherosclerosis. 5. COPD. 6. Diffuse bronchial thickening with posterior basal segmental bronchial impactions in the right lower lobe and bilateral scattered subsegmental bronchial impactions in both lower lobes. 7. Trace right pleural effusion.  No focal infiltrate. 8. Osteopenia, kyphosis and degenerative change of the thoracic spine. 9. Chronic appearing T12 compression fracture. 10. Capsular nodularity suspected over portions of the dorsal liver surface, may be seen with early cirrhosis. Aortic Atherosclerosis (ICD10-I70.0) and Emphysema (ICD10-J43.9). Electronically Signed   By: Francis Quam M.D.   On: 01/14/2024 07:12   CT Head Wo Contrast Result Date: 01/14/2024 CLINICAL DATA:  Fall EXAM: CT HEAD WITHOUT IMPRESSION: Atrophy, chronic microvascular disease. No acute intracranial abnormality. Electronically Signed   By: Franky Crease M.D.   On: 01/14/2024 02:02   DG Chest Portable 1 View Result Date: 01/14/2024 IMPRESSION: COPD. Borderline cardiomegaly. No active disease. Electronically Signed   By: Franky Crease M.D.   On: 01/14/2024 01:03   Assessment and Plan:  Newly diagnosed HFrEF - Patient presented 7/11 after mechanical fall at home, worsening shortness of breath for the past several days in addition to several month history of lower extremity edema.  Was reportedly diagnosed with CHF by his PCP, started on Lasix , and referred to cardiology. Echo was not obtained. He was not able to get an appointment until October and has not yet been seen. - Echo this admission shows EF 35 to 40% with global hypokinesis, G1 DD, mildly reduced RV function - Has been diuresed during admission with IV Lasix  40 mg twice daily with poorly recorded I/Os and no weights recorded - Appears euvolemic on exam,  will discontinue Lasix  for now with Cr trending up - Continue to monitor kidney function, strict I/Os, and daily weights with ongoing diuresis - Continue irbesartan  150 mg daily and metoprolol  tartrate 25 mg twice daily - Discontinue amlodipine  in the setting of borderline hypotension - Continue to progress GDMT as kidney function and BP allow - In the setting of newly reduced EF and several risk factors for CAD, recommend proceeding with R/LHC tentatively scheduled for tomorrow. Patient should be NPO after midnight.   Informed Consent   Shared Decision Making/Informed Consent The risks [stroke (1 in 1000), death (1 in 1000), kidney failure [usually temporary] (1 in 500), bleeding (1 in 200), allergic reaction [possibly serious] (1 in 200)], benefits (diagnostic support and management of coronary artery disease) and alternatives of a cardiac catheterization were discussed in detail with Terry Garcia and he is willing to proceed.     New onset atrial fibrillation - No known history of atrial fibrillation - Telemetry during admission reveals rhythm concerning for atrial fibrillation vs multifocal atrial tachycardia with rates 90-120 bpm - Recommend repletion for K > 4, mag wnl - Start IV heparin  for anticoagulation in the setting of CHA2DS2VASc at least 5. Will need to discuss risk vs benefit of long term anticoagulation given recurrent falls and alcohol abuse.  - Continue metoprolol  tartrate 25 mg twice daily  Hypertension - BP borderline low - Discontinue amlodipine  - Continue irbesartan  and metoprolol  as above  COPD - Longstanding history - No longer requiring supplemental O2 - Ongoing management per IM  Alcohol abuse -  May be contributing some to tachycardia - CIWA protocol per IM  Tobacco use - Continue nicotine  patch - Recommend cessation  Fall Scalp laceration - Originally presenting after fall from chair while sleeping - CT head negative - Scalp laceration repaired with  staple in ED  Risk Assessment/Risk Scores:  New [place]  Heart Association (NYHA) Functional Class NYHA Class II  CHA2DS2-VASc Score = 5   This indicates a 7.2% annual risk of stroke. The patient's score is based upon: CHF History: 1 HTN History: 1 Diabetes History: 0 Stroke History: 0 Vascular Disease History: 1 Age Score: 2 Gender Score: 0     For questions or updates, please contact [facility] Please consult [email or web address] for contact info under    Signed, Lesley LITTIE Maffucci, PA-C  01/17/2024 9:35 AM

## 2024-01-17 NOTE — Plan of Care (Signed)
  Problem: Education: Goal: Knowledge of General Education information will improve Description: Including pain rating scale, medication(s)/side effects and non-pharmacologic comfort measures Outcome: Progressing   Problem: Health Behavior/Discharge Planning: Goal: Ability to manage health-related needs will improve Outcome: Progressing   Problem: Clinical Measurements: Goal: Respiratory complications will improve Outcome: Progressing Goal: Cardiovascular complication will be avoided Outcome: Progressing   Problem: Activity: Goal: Risk for activity intolerance will decrease Outcome: Progressing   Problem: Nutrition: Goal: Adequate nutrition will be maintained Outcome: Progressing   Problem: Coping: Goal: Level of anxiety will decrease Outcome: Progressing   

## 2024-01-17 NOTE — Progress Notes (Signed)
 Heart Failure Nurse Navigator Progress Note  PCP: Loreli Kins, MD PCP-Cardiologist: Joylene Eliseo Cage, MD Admission Diagnosis: Fall, initial encounter Alcohol use Tachycardia Traumatic injury of head, initial encounter Skin tear of forearm without complication, left, initial encounter Orthopnea Exertional dyspnea Stapled skin wound Acute on chronic congestive heart fail;ure, unspecified heart failure type [facility]) Admitted from: Home  Presentation:   Terry Garcia presented when he fell asleep at the dinning room table and had a fall. Laceration to left top of his head and skin tear and hematoma to left upper forearm. Patient was hypotensive , BP 97/81.   Hx: COPD, Tobacco and ETOH abuse.  Complained of gradual worsening of shortness of breath and lower extremity edema over the past several weeks.  BNP 401.0. HS-Troponin 27. Lactic acid 1.4. Chest x-ray: Borderline cardiomegaly. CT head noted no acute abnormality.    ECHO/ LVEF: 35-40% Grade 1 diastolic dysfunction (impaired relaxation)  Clinical Course:  Past Medical History:  Diagnosis Date   Arterial atherosclerosis    per pt   COPD (chronic obstructive pulmonary disease) (HCC)    controlled with daily inhaler and albuterol  as needed   GERD (gastroesophageal reflux disease)    no meds needed   History of kidney stones    needed open abdominal surgery for stone in 1978, no stones since   Hypertension    amlodipine  and lisinopril/HCTZ   Right bundle branch block    per pt, managed by primary, does not see cardiologist     Social History   Socioeconomic History   Marital status: Married    Spouse name: Nena   Number of children: 2   Years of education: Not on file   Highest education level: 12th grade  Occupational History   Occupation: Retired  Tobacco Use   Smoking status: Every Day    Current packs/day: 0.50    Types: Cigarettes   Smokeless tobacco: Never   Tobacco comments:    one pack lasts 41  hours  Substance and Sexual Activity   Alcohol use: Yes    Comment: one shot of liquor or beer daily   Drug use: Not Currently   Sexual activity: Not on file  Other Topics Concern   Not on file  Social History Narrative   Not on file   Social Drivers of Health   Financial Resource Strain: Not on file  Food Insecurity: No Food Insecurity (01/17/2024)   Hunger Vital Sign    Worried About Running Out of Food in the Last Year: Never true    Ran Out of Food in the Last Year: Never true  Transportation Needs: No Transportation Needs (01/17/2024)   PRAPARE - Administrator, Civil Service (Medical): No    Lack of Transportation (Non-Medical): No  Physical Activity: Not on file  Stress: Not on file  Social Connections: Moderately Isolated (01/15/2024)   Social Connection and Isolation Panel    Frequency of Communication with Friends and Family: Three times a week    Frequency of Social Gatherings with Friends and Family: Once a week    Attends Religious Services: Never    Database administrator or Organizations: No    Attends Engineer, structural: Never    Marital Status: Living with partner   Education Assessment and Provision:  Detailed education and instructions provided on heart failure disease management including the following:  Signs and symptoms of Heart Failure When to call the physician Importance of daily weights Low sodium  diet Fluid restriction Medication management Anticipated future follow-up appointments  Patient education given on each of the above topics.  Patient acknowledges understanding via teach back method and acceptance of all instructions.  Education Materials:  Living Better With Heart Failure Booklet, HF zone tool, & Daily Weight Tracker Tool.  Patient has scale at home: Yes Patient has pill box at home: No    High Risk Criteria for Readmission and/or Poor Patient Outcomes: Heart failure hospital admissions (last 6 months): 0   No Show rate: 0% Difficult social situation: None Demonstrates medication adherence: Yes Primary Language: English Literacy level: Reading, Writing & Comprehension  Barriers of Care:   Smoking & ETOH abuse  Considerations/Referrals:   Referral made to Heart Failure Pharmacist Stewardship: Yes Referral made to Heart Failure CSW/NCM TOC: No Referral made to Heart & Vascular TOC clinic: Yes. [facility] 02/03/24 @ 3:00 PM  Items for Follow-up on DC/TOC: Diet & Fluid Restrictions-Patient eats large amounts of Sodium. Puts salt in his beer and also on lemons to eat. Daily Weights-not currently doing daily weights Continued Heart Failure Education Smoking & ETOH Cessation  Charmaine Pines, RN, BSN [facility] Heart Failure Navigator Secure Chat Only

## 2024-01-17 NOTE — Progress Notes (Signed)
 Heart Failure Stewardship Pharmacy Note  PCP: Loreli Kins, MD PCP-Cardiologist: None  HPI: Terry Garcia is a 78 y.o. male with COPD, tobacco abuse, CHF on Lasix , EtOH abuse who presented after a fall in his home. Patient also reported gradual worsening of shortness of breath and lower extremity edema over the past several weeks. On admission, BNP was 401, HS-troponin was 25, and lactic acid was 1.4. Chest x-ray noted no active disease. CT head noted no acute abnormality. CTA noted negative for PE and positive for COPD and trace right pleural effusion. Echocardiogram this admission showed LVEF of 35-40%, grade I diastolic dysfunction and mildly reduced RV function.  Pertinent Lab Values: Creatinine, Ser  Date Value Ref Range Status  01/17/2024 0.92 0.61 - 1.24 mg/dL Final   BUN  Date Value Ref Range Status  01/17/2024 17 8 - 23 mg/dL Final   Potassium  Date Value Ref Range Status  01/17/2024 3.7 3.5 - 5.1 mmol/L Final   Sodium  Date Value Ref Range Status  01/17/2024 134 (L) 135 - 145 mmol/L Final   B Natriuretic Peptide  Date Value Ref Range Status  01/14/2024 401.0 (H) 0.0 - 100.0 pg/mL Final    Comment:    Performed at [facility], [address] [place], [place] [address]   Magnesium  Date Value Ref Range Status  01/17/2024 2.0 1.7 - 2.4 mg/dL Final    Comment:    Performed at [facility], [address] [place], [place] [address]    Vital Signs:  Temp:  [97.4 F (36.3 C)-98.6 F (37 C)] 98.1 F (36.7 C) (07/14 0758) Pulse Rate:  [63-108] 63 (07/14 0758) Cardiac Rhythm: Atrial fibrillation;Bundle branch block (07/14 0759) Resp:  [16] 16 (07/14 0758) BP: (87-118)/(64-84) 91/77 (07/14 0758) SpO2:  [90 %-96 %] 92 % (07/14 0758)  Intake/Output Summary (Last 24 hours) at 01/17/2024 0832 Last data filed at 01/17/2024 0222 Gross per 24 hour  Intake 717 ml  Output --  Net 717 ml    Current Heart Failure Medications:  Loop diuretic:  none Beta-Blocker: metoprolol  tartrate 25 mg BID ACEI/ARB/ARNI: irbesartan  150 mg daily MRA: none SGLT2i: none Other: none  Prior to admission Heart Failure Medications:  Loop diuretic: furosemide  20 mg daily Beta-Blocker: none ACEI/ARB/ARNI: olmesartan  20 mg daily MRA: none SGLT2i: none Other: amlodipine  10 mg daily  Assessment: 1. 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 NYHA class II-III symptoms.  -Symptoms: Symptoms reportedly improved.  -Volume: Patient diuresed with IV Lasix , discontinued today with euvolemia. -Hemodynamics: BP has been persistently low today. Amlodipine  stopped, however, may require holding further medications. -BB: Currently on metoprolol  tartrate 25 mg BID. Consider conversion to metoprolol  succinate prior to discharge. Patient may require dose reduction given hypotension with HR in 50s. -ACEI/ARB/ARNI: Currently on irbesartan  150 mg daily. He may require dose reduction tomorrow morning. Has already received dose this AM. -MRA: Unable to add at this time given hypotension. -SGLT2i: Can consider adding in the future pending copay estimate.  Plan: 1) Medication changes recommended at this time: -Patient may require deescalation of metoprolol  and/or irbesartan  pending afternoon BPs.  2) Patient assistance: -Copays for Entresto, Farxiga , and Jardiance are $0  3) Education: -To be completed prior to discharge.  Medication Assistance / Insurance Benefits Check: Does the patient have prescription insurance?    Type of insurance plan:  Does the patient qualify for medication assistance through manufacturers or  grants? No   Outpatient Pharmacy: Prior to admission outpatient pharmacy: [facility]      Please do not hesitate to reach out with questions or concerns,  Jaun Bash, PharmD, CPP, BCPS, [facility] Heart Failure Pharmacist  Phone - [phone] 01/17/2024  1:52 PM

## 2024-01-17 NOTE — Progress Notes (Signed)
 PROGRESS NOTE    Terry Garcia  FMW:[id number] DOB: 1945/08/05 DOA: 01/14/2024 PCP: Loreli Kins, MD  Chief Complaint  Patient presents with   [facility] Course:  Terry Garcia is a 78 y.o. male with COPD, tobacco abuse, self-reported CHF on Lasix  (no echo on file), EtOH abuse, who presents to the ED after sustaining a fall.  Patient reports that he was sleeping at his dining room table which is common for him, but he fell off of his chair and onto his left side.  He sustained a skin abrasion to the left elbow and left side of his head.  He did not lose consciousness.  He was able to get up with the help of his family and ambulated immediately after. In the ED he underwent scalp laceration repair with 1 staple. While being worked up he complained of progressive shortness of breath over the last many weeks and was noted to have some peripheral edema.  He was found to be persistently tachycardic in the 110s to 120s.  He was also found to be tremulous. Labs are mostly unremarkable.  BNP elevation of 401.  Mildly elevated D-dimer.  CTA without PE. We are requested for admission for CHF exacerbation.  On my evaluation patient reports that he is only slightly more edematous than his baseline.  He reports he has a cardiologist outpatient and takes daily Lasix .  When discussing his alcohol intake he endorses 3 shots of right liquor daily.  His last drink was 7/10.  Subjective: Patient reports he is feeling much better today.  He is now completely off of oxygen.  He does not want to take metoprolol  as prescribed but he is amendable to heart cath tomorrow. His son is at bedside.  We discussed care plan.  Objective: Vitals:   01/17/24 0725 01/17/24 0758 01/17/24 1225 01/17/24 1300  BP:  91/77 (!) 77/65   Pulse:  63 (!) 58   Resp:  16 16   Temp:  98.1 F (36.7 C) 98.2 F (36.8 C)   TempSrc:  Oral Oral   SpO2: 92% 92% 92%   Weight:    84.7 kg  Height:    5' 10 (1.778 m)     Intake/Output Summary (Last 24 hours) at 01/17/2024 1352 Last data filed at 01/17/2024 1027 Gross per 24 hour  Intake 957 ml  Output --  Net 957 ml   Filed Weights   01/17/24 1300  Weight: 84.7 kg    Examination: General exam: Appears calm and comfortable, NAD  Respiratory system: No respiratory distress, no crackles, no wheezing.  On room air.  Comfortable Cardiovascular system: Tachycardic  gastrointestinal system: Abdomen is nondistended, soft and nontender.  Neuro: Alert and oriented. No focal neurological deficits. Extremities: Symmetric, expected ROM Skin: No rashes, lesions Psychiatry: Demonstrates appropriate judgement and insight. Mood & affect appropriate for situation.   Assessment & Plan:  CHF - Patient endorses history of CHF, though no echocardiogram on file - Takes 20 mg Lasix  daily at home, reports he has been waiting for cardiology appointment. - Echocardiogram here: LVEF 35 to 40%, global hypokinesis, grade 1 diastolic dysfunction - Continue current diuresis with 40 mg IV twice daily, kidney function as tolerated - Continue to follow strict I's and O's, these are not being recorded appropriately.  Patient does endorse diuresis - Low-sodium diet  Atrial fibrillation - Intermittent throughout this admission.  New diagnosis - Initiate metoprolol  twice daily, patient has been refusing - Continue to  monitor on telemetry - Echocardiogram with global hypokinesis.  Suspect ischemic heart disease - Have consulted cardiology, they are planning for left heart cath tomorrow - Patient is high fall risk but has a CHA2DS2-VASc of 5.  Currently on IV heparin  per cardiology  Acute hypoxic respiratory failure - Resolved after IV diuresis and IV steroids - Aeration much improved - Will begin tapering steroids.  EtOH abuse - 3 shots whiskey daily - Patient is tremulous and tachycardic. - Tachycardia is intermittently atrial fibrillation so have initiated metoprolol  as  above - Suspect tremor may be baseline - She was on scheduled Ativan  but has requested to discontinue and proceed with as needed Ativan  only. - No signs of delirium - Continue with CIWA protocol.  No additional Ativan  has been needed -- Continue thiamine  multivitamin  Tobacco abuse - Continue nicotine  patch   COPD - Now off of oxygen - Continue steroid taper - Continue with DuoNebs - Resume home dose inhalers   Hypertension - Resume home meds   Scalp laceration - Repaired in ED with 1 staple - Remove stable outpatient within 1 week   Fall - Sustained fall off his kitchen chair where he was sleeping. - Head CT negative - PT/OT evals   DVT prophylaxis: Heparin     Code Status: Full Code Disposition: Planning for left heart cath tomorrow  Consultants:  Treatment Team:  Consulting Physician: Darliss Rogue, MD  Procedures:    Antimicrobials:  Anti-infectives (From admission, onward)    None       Data Reviewed: I have personally reviewed following labs and imaging studies CBC: Recent Labs  Lab 01/14/24 0043 01/15/24 0554 01/16/24 0302 01/17/24 0611  WBC 6.1 5.3 5.6 6.9  NEUTROABS  --   --  3.1 4.9  HGB 15.7 13.8 13.1 13.5  HCT 47.0 40.5 39.3 39.7  MCV 90.7 91.6 91.2 89.8  PLT 163 150 151 145*   Basic Metabolic Panel: Recent Labs  Lab 01/14/24 0043 01/15/24 0554 01/16/24 0302 01/17/24 0611  NA 138 137 136 134*  K 4.1 3.0* 3.1* 3.7  CL 96* 97* 98 97*  CO2 30 32 29 29  GLUCOSE 131* 107* 104* 116*  BUN 8 10 11 17   CREATININE 0.93 0.74 0.88 0.92  CALCIUM  9.2 8.6* 8.6* 9.1  MG  --   --  2.0 2.0  PHOS  --   --  3.6 2.9   GFR: Estimated Creatinine Clearance: 70.5 mL/min (by C-G formula based on SCr of 0.92 mg/dL). Liver Function Tests: Recent Labs  Lab 01/14/24 0043 01/15/24 0554 01/16/24 0302 01/17/24 0611  AST 20 17 16 17   ALT 17 15 15 16   ALKPHOS 73 51 51 49  BILITOT 1.1 1.4* 0.9 1.0  PROT 7.1 6.0* 6.4* 6.7  ALBUMIN 3.9 3.3*  3.4* 3.6   CBG: No results for input(s): GLUCAP in the last 168 hours.  Recent Results (from the past 240 hours)  Resp panel by RT-PCR (RSV, Flu A&B, Covid) Anterior Nasal Swab     Status: None   Collection Time: 01/14/24 12:43 AM   Specimen: Anterior Nasal Swab  Result Value Ref Range Status   SARS Coronavirus 2 by RT PCR NEGATIVE NEGATIVE Final    Comment: (NOTE) SARS-CoV-2 target nucleic acids are NOT DETECTED.  The SARS-CoV-2 RNA is generally detectable in upper respiratory specimens during the acute phase of infection. The lowest concentration of SARS-CoV-2 viral copies this assay can detect is 138 copies/mL. A negative result does not preclude SARS-Cov-2  infection and should not be used as the sole basis for treatment or other patient management decisions. A negative result may occur with  improper specimen collection/handling, submission of specimen other than nasopharyngeal swab, presence of viral mutation(s) within the areas targeted by this assay, and inadequate number of viral copies(<138 copies/mL). A negative result must be combined with clinical observations, patient history, and epidemiological information. The expected result is Negative.  Fact Sheet for Patients:  BloggerCourse.com  Fact Sheet for Healthcare Providers:  SeriousBroker.it  This test is no t yet approved or cleared by the [place]  FDA and  has been authorized for detection and/or diagnosis of SARS-CoV-2 by FDA under an Emergency Use Authorization (EUA). This EUA will remain  in effect (meaning this test can be used) for the duration of the COVID-19 declaration under Section 564(b)(1) of the Act, 21 U.S.C.section 360bbb-3(b)(1), unless the authorization is terminated  or revoked sooner.       Influenza A by PCR NEGATIVE NEGATIVE Final   Influenza B by PCR NEGATIVE NEGATIVE Final    Comment: (NOTE) The Xpert Xpress SARS-CoV-2/FLU/RSV  plus assay is intended as an aid in the diagnosis of influenza from Nasopharyngeal swab specimens and should not be used as a sole basis for treatment. Nasal washings and aspirates are unacceptable for Xpert Xpress SARS-CoV-2/FLU/RSV testing.  Fact Sheet for Patients: BloggerCourse.com  Fact Sheet for Healthcare Providers: SeriousBroker.it  This test is not yet approved or cleared by the [place]  FDA and has been authorized for detection and/or diagnosis of SARS-CoV-2 by FDA under an Emergency Use Authorization (EUA). This EUA will remain in effect (meaning this test can be used) for the duration of the COVID-19 declaration under Section 564(b)(1) of the Act, 21 U.S.C. section 360bbb-3(b)(1), unless the authorization is terminated or revoked.     Resp Syncytial Virus by PCR NEGATIVE NEGATIVE Final    Comment: (NOTE) Fact Sheet for Patients: BloggerCourse.com  Fact Sheet for Healthcare Providers: SeriousBroker.it  This test is not yet approved or cleared by the [place]  FDA and has been authorized for detection and/or diagnosis of SARS-CoV-2 by FDA under an Emergency Use Authorization (EUA). This EUA will remain in effect (meaning this test can be used) for the duration of the COVID-19 declaration under Section 564(b)(1) of the Act, 21 U.S.C. section 360bbb-3(b)(1), unless the authorization is terminated or revoked.  Performed at [facility], [address] [place], [place] [address]   Blood culture (routine x 2)     Status: None (Preliminary result)   Collection Time: 01/14/24  4:53 AM   Specimen: BLOOD  Result Value Ref Range Status   Specimen Description   Final    BLOOD BLOOD LEFT ARM Performed at [facility], [address], [place], [place] [address]    Special Requests   Final    BOTTLES DRAWN AEROBIC AND ANAEROBIC Blood Culture  adequate volume Performed at [facility], [address], [place], [place] [address]    Culture   Final    NO GROWTH 3 DAYS Performed at [facility], [address] [place], [place] [address]    Report Status PENDING  Incomplete  Blood culture (routine x 2)     Status: None (Preliminary result)   Collection Time: 01/14/24  4:54 AM   Specimen: BLOOD  Result Value Ref Range Status   Specimen Description   Final    BLOOD BLOOD RIGHT ARM Performed at [facility], [address] [place], [place] [address]    Special Requests   Final    BOTTLES DRAWN AEROBIC AND ANAEROBIC Blood Culture adequate volume Performed at [facility], [address], [place], [place] [address]    Culture   Final    NO GROWTH 3 DAYS Performed at [facility], [address] [place], [place] [address]    Report Status PENDING  Incomplete     Radiology Studies: ECHOCARDIOGRAM COMPLETE Result Date: 01/16/2024    ECHOCARDIOGRAM REPORT   Patient Name:   BERTON BUTRICK Date of Exam: 01/16/2024 Medical Rec #:  [id number]      Height:       70.5 in Accession #:    [id number]     Weight:       201.1 lb Date of Birth:  March 11, 1946       BSA:          2.103 m Patient Age:    78 years       BP:           125/87 mmHg Patient Gender: M              HR:           119 bpm. Exam Location:  [facility] Procedure: 2D Echo, Cardiac Doppler and Color Doppler (Both Spectral and Color            Flow Doppler were utilized during procedure). Indications:     CHF-Acute Diastolic I50.31  History:         Patient has no prior history of Echocardiogram examinations.                  Arrythmias:RBBB; Risk Factors:Hypertension.  Sonographer:     Bari Roar Referring Phys:  [id number] Keya Wynes Diagnosing Phys: Cara JONETTA Lovelace MD IMPRESSIONS  1. Left ventricular ejection fraction, by estimation, is 35 to 40%. The left ventricle has moderately decreased function. The left ventricle demonstrates global  hypokinesis. Left ventricular diastolic parameters are consistent with Grade I diastolic dysfunction (impaired relaxation).  2. Right ventricular systolic function is mildly reduced. The right ventricular size is mildly enlarged. Mildly increased right ventricular wall thickness.  3. Left atrial size was mildly dilated.  4. Right atrial size was moderately dilated.  5. The mitral valve is normal in structure. No evidence of mitral valve regurgitation.  6. The aortic valve is normal in structure. Aortic valve regurgitation is not visualized. FINDINGS  Left Ventricle: Left ventricular ejection fraction, by estimation, is 35 to 40%. The left ventricle has moderately decreased function. The left ventricle demonstrates global hypokinesis. Strain was performed and the global longitudinal strain is indeterminate. The left ventricular internal cavity size was normal in size. There is borderline left ventricular hypertrophy. Left ventricular diastolic parameters are consistent with Grade I diastolic dysfunction (impaired relaxation). Right Ventricle: The right ventricular size is mildly enlarged. Mildly increased right ventricular wall thickness. Right ventricular systolic function is mildly reduced. Left Atrium: Left atrial size was mildly dilated. Right Atrium: Right atrial size was moderately dilated. Pericardium: There is no evidence of pericardial effusion. Mitral Valve: The mitral valve is normal in structure. No evidence of mitral valve regurgitation. Tricuspid Valve: The tricuspid valve is normal in structure. Tricuspid valve regurgitation is mild. Aortic Valve: The aortic valve is normal in structure. Aortic valve regurgitation is not visualized. Aortic valve mean gradient measures 4.0 mmHg. Aortic valve peak gradient measures 8.3 mmHg. Aortic valve area, by VTI measures 2.18  cm. Pulmonic Valve: The pulmonic valve was grossly normal. Pulmonic valve regurgitation is not visualized. Aorta: The ascending aorta was not  well visualized. IAS/Shunts: No atrial level shunt detected by color flow Doppler. Additional Comments: 3D was performed not requiring image post processing on an independent workstation and was indeterminate.  LEFT VENTRICLE PLAX 2D LVIDd:         5.00 cm     Diastology LVIDs:         4.05 cm     LV e' medial:    10.80 cm/s LV PW:         1.00 cm     LV E/e' medial:  10.7 LV IVS:        1.10 cm     LV e' lateral:   13.60 cm/s LVOT diam:     2.00 cm     LV E/e' lateral: 8.5 LV SV:         43 LV SV Index:   20 LVOT Area:     3.14 cm  LV Volumes (MOD) LV vol d, MOD A2C: 91.1 ml LV vol d, MOD A4C: 91.6 ml LV vol s, MOD A2C: 53.2 ml LV vol s, MOD A4C: 58.2 ml LV SV MOD A2C:     37.9 ml LV SV MOD A4C:     91.6 ml LV SV MOD BP:      36.5 ml RIGHT VENTRICLE RV Basal diam:  3.10 cm RV Mid diam:    2.60 cm RV S prime:     13.30 cm/s TAPSE (M-mode): 1.9 cm LEFT ATRIUM              Index        RIGHT ATRIUM           Index LA diam:        4.50 cm  2.14 cm/m   RA Area:     24.30 cm LA Vol (A2C):   102.0 ml 48.50 ml/m  RA Volume:   74.70 ml  35.52 ml/m LA Vol (A4C):   79.2 ml  37.66 ml/m LA Biplane Vol: 91.8 ml  43.65 ml/m  AORTIC VALVE                    PULMONIC VALVE AV Area (Vmax):    1.88 cm     PV Vmax:        1.35 m/s AV Area (Vmean):   1.97 cm     PV Peak grad:   7.3 mmHg AV Area (VTI):     2.18 cm     RVOT Peak grad: 4 mmHg AV Vmax:           144.00 cm/s AV Vmean:          91.100 cm/s AV VTI:            0.197 m AV Peak Grad:      8.3 mmHg AV Mean Grad:      4.0 mmHg LVOT Vmax:         86.20 cm/s LVOT Vmean:        57.200 cm/s LVOT VTI:          0.137 m LVOT/AV VTI ratio: 0.70  AORTA Ao Root diam: 2.90 cm Ao Asc diam:  3.60 cm MITRAL VALVE                TRICUSPID VALVE MV Area (PHT): 6.12 cm     TR Peak grad:   36.5  mmHg MV Decel Time: 124 msec     TR Vmax:        302.00 cm/s MV E velocity: 116.00 cm/s                             SHUNTS                             Systemic VTI:  0.14 m                              Systemic Diam: 2.00 cm Cara JONETTA Lovelace MD Electronically signed by Cara JONETTA Lovelace MD Signature Date/Time: 01/16/2024/6:34:53 PM    Final      Scheduled Meds:  arformoterol   15 mcg Nebulization BID   And   umeclidinium bromide   1 puff Inhalation Daily   folic acid   1 mg Oral Daily   heparin   4,700 Units Intravenous Once   ipratropium-albuterol   3 mL Nebulization BID   irbesartan   150 mg Oral Daily   metoprolol  tartrate  25 mg Oral BID   multivitamin with minerals  1 tablet Oral Daily   thiamine   100 mg Oral Daily   Or   thiamine   100 mg Intravenous Daily   Continuous Infusions:  heparin        LOS: 2 days  MDM: Patient is high risk for one or more organ failure.  They necessitate ongoing hospitalization for continued IV therapies and subsequent lab monitoring. Total time spent interpreting labs and vitals, reviewing the medical record, coordinating care amongst consultants and care team members, directly assessing and discussing care with the patient and/or family: 55 min  Tayten Heber, DO [facility]  To contact the attending physician between 7A-7P please use Epic Chat. To contact the covering physician during after hours 7P-7A, please review Amion.  01/17/2024, 1:52 PM   *This document has been created with the assistance of dictation software. Please excuse typographical errors. *

## 2024-01-17 NOTE — Progress Notes (Addendum)
 PHARMACY - ANTICOAGULATION CONSULT NOTE  Pharmacy Consult for heparin  Indication: atrial fibrillation  Allergies  Allergen Reactions   Budeson-Glycopyrrol-Formoterol     Other Reaction(s): tongue swelling   Budesonide-Formoterol Fumarate     Other Reaction(s): ineffective   Advair Diskus [Fluticasone-Salmeterol] Other (See Comments)    Pt states gave him the shakes   Levaquin [Levofloxacin] Other (See Comments)    Pt states made him feel extreme anger     Patient Measurements: Height: 5' 10 (177.8 cm) Weight: 84.7 kg (186 lb 11.7 oz) IBW/kg (Calculated) : 73 HEPARIN  DW (KG): 84.7  Vital Signs: Temp: 98.1 F (36.7 C) (07/14 0758) Temp Source: Oral (07/14 0758) BP: 91/77 (07/14 0758) Pulse Rate: 63 (07/14 0758)  Labs: Recent Labs    01/15/24 0554 01/16/24 0302 01/17/24 0611  HGB 13.8 13.1 13.5  HCT 40.5 39.3 39.7  PLT 150 151 145*  LABPROT 13.9  --   --   INR 1.0  --   --   CREATININE 0.74 0.88 0.92    CrCl cannot be calculated (Unknown ideal weight.).   Medical History: Past Medical History:  Diagnosis Date   Arterial atherosclerosis    per pt   COPD (chronic obstructive pulmonary disease) (HCC)    controlled with daily inhaler and albuterol  as needed   GERD (gastroesophageal reflux disease)    no meds needed   History of kidney stones    needed open abdominal surgery for stone in 1978, no stones since   Hypertension    amlodipine  and lisinopril/HCTZ   Right bundle branch block    per pt, managed by primary, does not see cardiologist    Medications:  Scheduled:   irbesartan   150 mg Oral Daily   And   amLODipine   10 mg Oral Daily   arformoterol   15 mcg Nebulization BID   And   umeclidinium bromide   1 puff Inhalation Daily   folic acid   1 mg Oral Daily   furosemide   40 mg Intravenous BID   heparin  injection (subcutaneous)  5,000 Units Subcutaneous Q12H   ipratropium-albuterol   3 mL Nebulization BID   metoprolol  tartrate  25 mg Oral BID    multivitamin with minerals  1 tablet Oral Daily   thiamine   100 mg Oral Daily   Or   thiamine   100 mg Intravenous Daily   Assessment: 78 y/o male presenting after mechanical fall at home - found to be in new onset atrial fibrillation. PMH significant for COPD, tobacco use, CHF with no prior echo on Lasix , alcohol abuse, hypertension. Pharmacy has been consulted to initiate heparin  infusion. Per chart review, patient was not on anticoagulation prior to admission.  Baseline labs: hgb 13.5, plt 145, INR 1.0 Last dose SQ heparin  5000 units: 01/17/24 @ 0930  Goal of Therapy:  Heparin  level 0.3-0.7 units/ml Monitor platelets by anticoagulation protocol: Yes   Plan:  Give 4700 units bolus x 1 Start heparin  infusion at 1150 units/hr Check anti-Xa level in 8 hours and daily while on heparin  Continue to monitor H&H and platelets  Thank you for involving pharmacy in this patient's care.   Damien Napoleon, PharmD Clinical Pharmacist 01/17/2024 12:13 PM

## 2024-01-17 NOTE — Progress Notes (Signed)
 PT Cancellation Note  Patient Details Name: DIN BOOKWALTER MRN: [id number] DOB: 1946-01-04   Cancelled Treatment:     Pt received in bed, politely declined PT session stating he ambulated out in hall and outside with mobility specialist earlier. Will re-attempt next available date/time per POC.   [name] 01/17/2024, 4:26 PM

## 2024-01-18 ENCOUNTER — Encounter
Admission: EM | Disposition: A | Discharge status: Home Health | Payer: Self-pay | Source: Home / Self Care | Attending: Family Medicine

## 2024-01-18 ENCOUNTER — Telehealth (HOSPITAL_COMMUNITY): Payer: Self-pay | Admitting: Pharmacy Technician

## 2024-01-18 ENCOUNTER — Encounter: Payer: Self-pay | Admitting: Family Medicine

## 2024-01-18 ENCOUNTER — Other Ambulatory Visit (HOSPITAL_COMMUNITY): Payer: Self-pay

## 2024-01-18 DIAGNOSIS — I2510 Atherosclerotic heart disease of native coronary artery without angina pectoris: Secondary | ICD-10-CM | POA: Diagnosis not present

## 2024-01-18 DIAGNOSIS — I4819 Other persistent atrial fibrillation: Secondary | ICD-10-CM | POA: Diagnosis not present

## 2024-01-18 DIAGNOSIS — I5021 Acute systolic (congestive) heart failure: Secondary | ICD-10-CM | POA: Diagnosis not present

## 2024-01-18 HISTORY — PX: RIGHT/LEFT HEART CATH AND CORONARY ANGIOGRAPHY: CATH118266

## 2024-01-18 LAB — COMPREHENSIVE METABOLIC PANEL WITH GFR
ALT: 32 U/L (ref 0–44)
AST: 33 U/L (ref 15–41)
Albumin: 3.8 g/dL (ref 3.5–5.0)
Alkaline Phosphatase: 49 U/L (ref 38–126)
Anion gap: 8 (ref 5–15)
BUN: 21 mg/dL (ref 8–23)
CO2: 32 mmol/L (ref 22–32)
Calcium: 9.1 mg/dL (ref 8.9–10.3)
Chloride: 97 mmol/L — ABNORMAL LOW (ref 98–111)
Creatinine, Ser: 0.93 mg/dL (ref 0.61–1.24)
GFR, Estimated: >60 mL/min (ref >60)
Glucose, Bld: 87 mg/dL (ref 70–99)
Potassium: 3.6 mmol/L (ref 3.5–5.1)
Sodium: 137 mmol/L (ref 135–145)
Total Bilirubin: 0.9 mg/dL (ref 0.0–1.2)
Total Protein: 6.8 g/dL (ref 6.5–8.1)

## 2024-01-18 LAB — POCT I-STAT 7, (LYTES, BLD GAS, ICA,H+H)
Acid-Base Excess: 5 mmol/L — ABNORMAL HIGH (ref 0.0–2.0)
Bicarbonate: 29.2 mmol/L — ABNORMAL HIGH (ref 20.0–28.0)
Calcium, Ion: 1.19 mmol/L (ref 1.15–1.40)
HCT: 41 % (ref 39.0–52.0)
Hemoglobin: 13.9 g/dL (ref 13.0–17.0)
O2 Saturation: 90 %
Potassium: 3.6 mmol/L (ref 3.5–5.1)
Sodium: 136 mmol/L (ref 135–145)
TCO2: 30 mmol/L (ref 22–32)
pCO2 arterial: 41.7 mmHg (ref 32–48)
pH, Arterial: 7.452 — ABNORMAL HIGH (ref 7.35–7.45)
pO2, Arterial: 55 mmHg — ABNORMAL LOW (ref 83–108)

## 2024-01-18 LAB — CBC WITH DIFFERENTIAL/PLATELET
Abs Immature Granulocytes: 0.02 K/uL (ref 0.00–0.07)
Basophils Absolute: 0 K/uL (ref 0.0–0.1)
Basophils Relative: 0 %
Eosinophils Absolute: 0.2 K/uL (ref 0.0–0.5)
Eosinophils Relative: 2 %
HCT: 42 % (ref 39.0–52.0)
Hemoglobin: 14.2 g/dL (ref 13.0–17.0)
Immature Granulocytes: 0 %
Lymphocytes Relative: 34 %
Lymphs Abs: 2.3 K/uL (ref 0.7–4.0)
MCH: 30.8 pg (ref 26.0–34.0)
MCHC: 33.8 g/dL (ref 30.0–36.0)
MCV: 91.1 fL (ref 80.0–100.0)
Monocytes Absolute: 0.5 K/uL (ref 0.1–1.0)
Monocytes Relative: 8 %
Neutro Abs: 3.6 K/uL (ref 1.7–7.7)
Neutrophils Relative %: 56 %
Platelets: 141 K/uL — ABNORMAL LOW (ref 150–400)
RBC: 4.61 MIL/uL (ref 4.22–5.81)
RDW: 13.7 % (ref 11.5–15.5)
WBC: 6.6 K/uL (ref 4.0–10.5)
nRBC: 0 % (ref 0.0–0.2)

## 2024-01-18 LAB — MAGNESIUM: Magnesium: 2.1 mg/dL (ref 1.7–2.4)

## 2024-01-18 LAB — PHOSPHORUS: Phosphorus: 4 mg/dL (ref 2.5–4.6)

## 2024-01-18 SURGERY — RIGHT/LEFT HEART CATH AND CORONARY ANGIOGRAPHY
Anesthesia: Moderate Sedation

## 2024-01-18 MED ORDER — HEPARIN (PORCINE) IN NACL 2000-0.9 UNIT/L-% IV SOLN
INTRAVENOUS | Status: DC | PRN
  Administered 2024-01-18: 1000 mL

## 2024-01-18 MED ORDER — MIDAZOLAM HCL 2 MG/2ML IJ SOLN
INTRAMUSCULAR | Status: AC
  Filled 2024-01-18: qty 2

## 2024-01-18 MED ORDER — POTASSIUM CHLORIDE CRYS ER 20 MEQ PO TBCR
40.0000 meq | EXTENDED_RELEASE_TABLET | Freq: Once | ORAL | Status: AC
  Administered 2024-01-18: 40 meq via ORAL
  Filled 2024-01-18: qty 2

## 2024-01-18 MED ORDER — SODIUM CHLORIDE 0.9 % IV SOLN: INTRAVENOUS | Status: DC

## 2024-01-18 MED ORDER — SODIUM CHLORIDE 0.9% FLUSH: 3.0000 mL | INTRAVENOUS | Status: DC | PRN

## 2024-01-18 MED ORDER — FENTANYL CITRATE (PF) 100 MCG/2ML IJ SOLN
INTRAMUSCULAR | Status: DC | PRN
  Administered 2024-01-18: 12.5 ug via INTRAVENOUS

## 2024-01-18 MED ORDER — HEPARIN (PORCINE) IN NACL 1000-0.9 UT/500ML-% IV SOLN
INTRAVENOUS | Status: AC
  Filled 2024-01-18: qty 1000

## 2024-01-18 MED ORDER — SODIUM CHLORIDE 0.9 % IV SOLN: 250.0000 mL | INTRAVENOUS | Status: AC | PRN

## 2024-01-18 MED ORDER — FENTANYL CITRATE (PF) 100 MCG/2ML IJ SOLN
INTRAMUSCULAR | Status: AC
  Filled 2024-01-18: qty 2

## 2024-01-18 MED ORDER — VERAPAMIL HCL 2.5 MG/ML IV SOLN
INTRAVENOUS | Status: DC | PRN
  Administered 2024-01-18: 2.5 mg via INTRA_ARTERIAL

## 2024-01-18 MED ORDER — ASPIRIN 81 MG PO CHEW
81.0000 mg | CHEWABLE_TABLET | ORAL | Status: AC
  Administered 2024-01-18: 81 mg via ORAL

## 2024-01-18 MED ORDER — IOHEXOL 300 MG/ML  SOLN
INTRAMUSCULAR | Status: DC | PRN
  Administered 2024-01-18: 44 mL

## 2024-01-18 MED ORDER — LABETALOL HCL 5 MG/ML IV SOLN: 10.0000 mg | INTRAVENOUS | Status: AC | PRN

## 2024-01-18 MED ORDER — ORAL CARE MOUTH RINSE: 15.0000 mL | OROMUCOSAL | Status: DC | PRN

## 2024-01-18 MED ORDER — HEPARIN (PORCINE) 25000 UT/250ML-% IV SOLN: 1450.0000 [IU]/h | INTRAVENOUS | Status: DC

## 2024-01-18 MED ORDER — SPIRONOLACTONE 12.5 MG HALF TABLET
12.5000 mg | ORAL_TABLET | Freq: Every day | ORAL | Status: DC
  Administered 2024-01-19 – 2024-01-20 (×2): 12.5 mg via ORAL
  Filled 2024-01-18 (×3): qty 1

## 2024-01-18 MED ORDER — HYDRALAZINE HCL 20 MG/ML IJ SOLN: 10.0000 mg | INTRAMUSCULAR | Status: AC | PRN

## 2024-01-18 MED ORDER — HEPARIN SODIUM (PORCINE) 1000 UNIT/ML IJ SOLN
INTRAMUSCULAR | Status: DC | PRN
  Administered 2024-01-18: 4500 [IU] via INTRAVENOUS

## 2024-01-18 MED ORDER — ASPIRIN 81 MG PO CHEW
CHEWABLE_TABLET | ORAL | Status: AC
Start: 2024-01-18 — End: 2024-01-18
  Filled 2024-01-18: qty 1

## 2024-01-18 MED ORDER — LIDOCAINE HCL 1 % IJ SOLN
INTRAMUSCULAR | Status: AC
  Filled 2024-01-18: qty 20

## 2024-01-18 MED ORDER — FUROSEMIDE 10 MG/ML IJ SOLN
40.0000 mg | Freq: Every day | INTRAMUSCULAR | Status: DC
  Administered 2024-01-18 – 2024-01-20 (×3): 40 mg via INTRAVENOUS
  Filled 2024-01-18 (×3): qty 4

## 2024-01-18 MED ORDER — HEPARIN SODIUM (PORCINE) 1000 UNIT/ML IJ SOLN
INTRAMUSCULAR | Status: AC
  Filled 2024-01-18: qty 10

## 2024-01-18 MED ORDER — MIDAZOLAM HCL 2 MG/2ML IJ SOLN
INTRAMUSCULAR | Status: DC | PRN
  Administered 2024-01-18: .5 mg via INTRAVENOUS

## 2024-01-18 MED ORDER — HEPARIN (PORCINE) 25000 UT/250ML-% IV SOLN
1850.0000 [IU]/h | INTRAVENOUS | Status: DC
  Administered 2024-01-18: 1450 [IU]/h via INTRAVENOUS
  Administered 2024-01-19: 1600 [IU]/h via INTRAVENOUS
  Administered 2024-01-20: 1850 [IU]/h via INTRAVENOUS
  Filled 2024-01-18 (×2): qty 250

## 2024-01-18 MED ORDER — METOPROLOL TARTRATE 25 MG PO TABS
25.0000 mg | ORAL_TABLET | Freq: Four times a day (QID) | ORAL | Status: DC
  Administered 2024-01-18 – 2024-01-19 (×3): 25 mg via ORAL
  Filled 2024-01-18 (×3): qty 1

## 2024-01-18 MED ORDER — VERAPAMIL HCL 2.5 MG/ML IV SOLN
INTRAVENOUS | Status: AC
Start: 2024-01-18 — End: 2024-01-18
  Filled 2024-01-18: qty 2

## 2024-01-18 MED ORDER — SODIUM CHLORIDE 0.9% FLUSH
3.0000 mL | Freq: Two times a day (BID) | INTRAVENOUS | Status: DC
  Administered 2024-01-18 – 2024-01-20 (×5): 3 mL via INTRAVENOUS

## 2024-01-18 MED ORDER — LIDOCAINE HCL (PF) 1 % IJ SOLN
INTRAMUSCULAR | Status: DC | PRN
  Administered 2024-01-18 (×2): 2 mL

## 2024-01-18 SURGICAL SUPPLY — 12 items
CATH BALLN WEDGE 5F 110CM (CATHETERS) IMPLANT
CATH INFINITI 5FR JL4 (CATHETERS) IMPLANT
CATH INFINITI JR4 5F (CATHETERS) IMPLANT
DEVICE RAD TR BAND REGULAR (VASCULAR PRODUCTS) IMPLANT
DRAPE BRACHIAL (DRAPES) IMPLANT
GLIDESHEATH SLEND SS 6F .021 (SHEATH) IMPLANT
GUIDEWIRE INQWIRE 1.5J.035X260 (WIRE) IMPLANT
PACK CARDIAC CATH (CUSTOM PROCEDURE TRAY) ×1 IMPLANT
PAD ELECT DEFIB RADIOL ZOLL (MISCELLANEOUS) IMPLANT
SET ATX-X65L (MISCELLANEOUS) IMPLANT
SHEATH GLIDE SLENDER 4/5FR (SHEATH) IMPLANT
STATION PROTECTION PRESSURIZED (MISCELLANEOUS) IMPLANT

## 2024-01-18 NOTE — Progress Notes (Signed)
 PT Cancellation Note  Patient Details Name: Terry Garcia MRN: [id number] DOB: 12-Jun-1946   Cancelled Treatment:     Pt transferred from 1C to 2A. Given transfer to higher level of care, will require new therapy orders to continue acute PT. Pt currently receiving cardiac cath. Please re-consult as appropriate following procedure.    [name] 01/18/2024, 10:00 AM

## 2024-01-18 NOTE — Progress Notes (Signed)
 Rounding Note   Patient Name: Terry Garcia Date of Encounter: 01/18/2024  [facility] Cardiologist: Darron  Subjective Terry Garcia is feeling better today with less shortness of breath.  He still has intermittent cough.  No chest pain or palpitations.  He tolerated right and left heart catheterization well earlier today, which revealed mildly to moderately elevated left and right heart filling pressures with normal cardiac output/index.  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  Scheduled Meds:  [MAR Hold] arformoterol   15 mcg Nebulization BID   And   [MAR Hold] umeclidinium bromide   1 puff Inhalation Daily   [MAR Hold] Chlorhexidine  Gluconate Cloth  6 each Topical Daily   [MAR Hold] folic acid   1 mg Oral Daily   furosemide   40 mg Intravenous Daily   [MAR Hold] ipratropium-albuterol   3 mL Nebulization BID   [MAR Hold] metoprolol  tartrate  25 mg Oral BID   [MAR Hold] multivitamin with minerals  1 tablet Oral Daily   [MAR Hold] potassium chloride   40 mEq Oral Once   [MAR Hold] predniSONE   20 mg Oral Q breakfast   [MAR Hold] sodium chloride  flush  10-40 mL Intracatheter Q12H   sodium chloride  flush  3 mL Intravenous Q12H   spironolactone   12.5 mg Oral Daily   [MAR Hold] thiamine   100 mg Oral Daily   Or   [MAR Hold] thiamine   100 mg Intravenous Daily   Continuous Infusions:  sodium chloride      heparin      PRN Meds: sodium chloride , hydrALAZINE , labetalol , [MAR Hold] ondansetron  **OR** [MAR Hold] ondansetron  (ZOFRAN ) IV, [MAR Hold] mouth rinse, [MAR Hold] senna-docusate, sodium chloride  flush   Vital Signs  Vitals:   01/18/24 1345 01/18/24 1400 01/18/24 1415 01/18/24 1430  BP: 138/85 138/68 (!) 145/87 136/78  Pulse: 95 87 87 91  Resp: 19 13 15 12   Temp:      TempSrc:      SpO2: 93% 92% 94% 93%  Weight:      Height:         Intake/Output Summary (Last 24 hours) at 01/18/2024 1444 Last data filed at 01/18/2024 0400 Gross per 24 hour  Intake 661.45 ml  Output 900 ml  Net -238.55 ml      01/18/2024    9:46 AM 01/18/2024    5:00 AM 01/17/2024    1:00 PM  Last 3 Weights  Weight (lbs) 188 lb 7.9 oz 188 lb 8 oz 186 lb 11.7 oz  Weight (kg) 85.5 kg 85.503 kg 84.7 kg      Telemetry Atrial fibrillation with PVCs and NSVT versus aberrancy, ventricular rates 90-120 bpm - Personally Reviewed  ECG  No new tracing  Physical Exam  GEN: No acute distress.   Neck: No JVD Cardiac: Distant heart sounds, irregularly irregular without murmurs. Respiratory: Coarse breath sounds bilaterally without wheezes or crackles. GI: Soft, nontender, non-distended  MS: No edema; No deformity. Neuro:  Nonfocal  Psych: Normal affect   Labs High Sensitivity Troponin:   Recent Labs  Lab 01/14/24 0043 01/14/24 0452  TROPONINIHS 25* 27*     Chemistry Recent Labs  Lab 01/16/24 0302 01/17/24 0611 01/18/24 0347 01/18/24 1134  NA 136 134* 137 136  K 3.1* 3.7 3.6 3.6  CL 98 97* 97*  --   CO2 29 29 32  --   GLUCOSE 104* 116* 87  --   BUN  11 17 21   --   CREATININE 0.88 0.92 0.93  --   CALCIUM  8.6* 9.1 9.1  --   MG 2.0 2.0 2.1  --   PROT 6.4* 6.7 6.8  --   ALBUMIN 3.4* 3.6 3.8  --   AST 16 17 33  --   ALT 15 16 32  --   ALKPHOS 51 49 49  --   BILITOT 0.9 1.0 0.9  --   GFRNONAA >60 >60 >60  --   ANIONGAP 9 8 8   --     Lipids  Recent Labs  Lab 01/17/24 0611  CHOL 128  TRIG 53  HDL 62  LDLCALC 55  CHOLHDL 2.1    Hematology Recent Labs  Lab 01/16/24 0302 01/17/24 0611 01/18/24 0347 01/18/24 1134  WBC 5.6 6.9 6.6  --   RBC 4.31 4.42 4.61  --   HGB 13.1 13.5 14.2 13.9  HCT 39.3 39.7 42.0 41.0  MCV 91.2 89.8 91.1  --   MCH 30.4 30.5 30.8  --   MCHC 33.3 34.0 33.8  --   RDW 13.7 13.4 13.7  --   PLT 151 145* 141*  --    Thyroid No results for input(s): TSH, FREET4 in the last 168 hours.   BNP Recent Labs  Lab 01/14/24 0454  BNP 401.0*    DDimer  Recent Labs  Lab 01/14/24 0454  DDIMER 1.19*     Radiology  CARDIAC CATHETERIZATION Result Date: 01/18/2024 Conclusions: Multivessel coronary artery disease with heavy calcification, as detailed below, including 70% mid LAD stenosis, chronically occluded first septal branch, sequential 50% & 90% LCx lesions, and tandem 50% RCA stenoses.  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 Mildly elevated left heart filling pressures. Mildly-moderately elevated right heart filling pressures. Normal Fick cardiac output/index. Recommendations: Resume heparin  infusion 2 hours after TR band has been removed; transition to DOAC as soon as tomorrow if there is no evidence of bleeding/vascular injury. Favor medical therapy of multivessel CAD given lack of angina. Terry Hanson, MD [facility]   Cardiac Studies See catheterization above.  TTE (01/16/2024): Normal LV size with borderline LVH.  LVEF 35-40% with global hypokinesis.  Mildly dilated RV with mildly reduced contraction.  Mild left and moderate right atrial enlargement.  No pericardial effusion.  Mild tricuspid regurgitation.  Patient Profile   78 y.o. male with recently diagnosed HFrEF, COPD, tobacco abuse, hypertension, and alcohol abuse, admitted with acute HFrEF and incidentally noted to be in atrial fibrillation with rapid ventricular response.  Assessment & Plan  Acute HFrEF: Clinically, Terry Garcia appears to be close to euvolemic though his right and left heart catheterization earlier today suggest that he is still mildly to moderately volume overloaded.  I have resumed furosemide  40 mg IV daily and will also add spironolactone  12.5 mg daily.  Continue metoprolol  tartrate 25 mg, though I will increase this to every 6 hours dosing with plans to consolidate to an evidence-based beta-blocker prior to discharge.  Hopefully, we can add an ARB/ARNI +/- SGLT-2  inhibitor in the next day or two as renal function and blood pressure allow.    Persistent atrial fibrillation: Ventricular rates are reasonably well-controlled overnight but a bit higher today.  I will increase metoprolol  to tartrate to 25 mg every 6 hours.  Resume heparin  infusion 2 hours after TR band has been removed with plans to transition to DOAC as soon as tomorrow if there is no evidence of bleeding or vascular injury at  catheterization sites.  I think it would be beneficial for Mr. Lalor to undergo TEE-guided cardioversion this admission and will tentatively plan to do this tomorrow.  Coronary artery disease: Catheterization today shows heavily calcified coronary arteries with moderate moderate to severe disease in multiple territories, not particularly well-suited to intervention.  Additionally, the patient has not had any angina.  Favor medical therapy given the lack of chest pain in the setting of mixed ischemic/nonischemic cardiomyopathy.  Informed Consent   Shared Decision Making/Informed Consent   The risks [stroke, cardiac arrhythmias rarely resulting in the need for a temporary or permanent pacemaker, skin irritation or burns, esophageal damage, perforation (1:10,000 risk), bleeding, pharyngeal hematoma as well as other potential complications associated with conscious sedation including aspiration, arrhythmia, respiratory failure and death], benefits (treatment guidance, restoration of normal sinus rhythm, diagnostic support) and alternatives of a transesophageal echocardiogram guided cardioversion were discussed in detail with Mr. Granja and he is willing to proceed.    For questions or updates, please contact [facility] Please consult [email or web address] for contact info under [facility] Card[facility].   Signed, Terry Hanson, MD  01/18/2024, 2:44 PM

## 2024-01-18 NOTE — Plan of Care (Signed)

## 2024-01-18 NOTE — Interval H&P Note (Signed)
 History and Physical Interval Note:  01/18/2024 10:59 AM  Terry Garcia  has presented today for surgery, with the diagnosis of heart failure with reduced ejection fraction.  The various methods of treatment have been discussed with the patient and family. After consideration of risks, benefits and other options for treatment, the patient has consented to  Procedure(s): RIGHT/LEFT HEART CATH AND CORONARY ANGIOGRAPHY (N/A) as a surgical intervention.  The patient's history has been reviewed, patient examined, no change in status, stable for surgery.  I have reviewed the patient's chart and labs.  Questions were answered to the patient's satisfaction.    Cath Lab Visit (complete for each Cath Lab visit)  Clinical Evaluation Leading to the Procedure:   ACS: No.  Non-ACS:    Anginal/Heart Failure Classification: NYHA class IV  Anti-ischemic medical therapy: Minimal Therapy (1 class of medications)  Non-Invasive Test Results: No non-invasive testing performed; LVEF 35-40% by echo -> intermediate risk  Prior CABG: No previous CABG  Alpa Salvo

## 2024-01-18 NOTE — Progress Notes (Signed)
 PROGRESS NOTE    Terry Garcia  FMW:[id number] DOB: 1945-07-16 DOA: 01/14/2024 PCP: Loreli Kins, MD  Chief Complaint  Patient presents with   [name] Course:  Terry Garcia is a 78 y.o. male with COPD, tobacco abuse, self-reported CHF on Lasix  (no echo on file), EtOH abuse, who presents to the ED after sustaining a fall.  Patient reports that he was sleeping at his dining room table which is common for him, but he fell off of his chair and onto his left side.  He sustained a skin abrasion to the left elbow and left side of his head.  He did not lose consciousness.  He was able to get up with the help of his family and ambulated immediately after. In the ED he underwent scalp laceration repair with 1 staple. While being worked up he complained of progressive shortness of breath over the last many weeks and was noted to have some peripheral edema.  He was found to be persistently tachycardic in the 110s to 120s.  He was also found to be tremulous. Labs are mostly unremarkable.  BNP elevation of 401.  Mildly elevated D-dimer.  CTA without PE. We are requested for admission for CHF exacerbation.  On my evaluation patient reports that he is only slightly more edematous than his baseline.  He reports he has a cardiologist outpatient and takes daily Lasix .  When discussing his alcohol intake he endorses 3 shots of right liquor daily.  His last drink was 7/10.  Subjective: Patient seen after LHC today. He has some concerns about taking metoprolol , we have discussed it and he is amendable now. No chest pain or SOB.  Objective: Vitals:   01/18/24 1415 01/18/24 1430 01/18/24 1445 01/18/24 1519  BP: (!) 145/87 136/78 (!) 141/61 (!) 155/74  Pulse: 87 91 90 97  Resp: 15 12 17 18   Temp:    97.8 F (36.6 C)  TempSrc:      SpO2: 94% 93% 93% 92%  Weight:      Height:        Intake/Output Summary (Last 24 hours) at 01/18/2024 1644 Last data filed at 01/18/2024 0400 Gross per 24 hour   Intake 661.45 ml  Output 900 ml  Net -238.55 ml   Filed Weights   01/17/24 1300 01/18/24 0500 01/18/24 0946  Weight: 84.7 kg 85.5 kg 85.5 kg    Examination: General exam: Appears calm and comfortable, NAD  Respiratory system: No respiratory distress, no crackles, no wheezing.  On room air.  Comfortable Cardiovascular system: Tachycardic  gastrointestinal system: Abdomen is nondistended, soft and nontender.  Neuro: Alert and oriented. No focal neurological deficits. Extremities: Symmetric, expected ROM Skin: No rashes, lesions Psychiatry: Demonstrates appropriate judgement and insight. Mood & affect appropriate for situation.   Assessment & Plan:  CHF - Patient endorses history of CHF, though no echocardiogram on file - Takes 20 mg Lasix  daily at home, reports he has been waiting for cardiology appointment. - Echocardiogram here: LVEF 35 to 40%, global hypokinesis, grade 1 diastolic dysfunction - Continue current diuresis with 40 mg IV, spironolactone  added today by cardiology.  Kidney function is tolerating - Consider SGLT2 with possible Entresto if affordable - Continue to follow strict I's and O's, these are not being recorded appropriately.  Patient does endorse diuresis.   - Low-sodium diet  Atrial fibrillation - Intermittent throughout this admission.  New diagnosis - Metoprolol  every 6 hours. - Currently on heparin  with plan to transition  to DOAC. - Patient is high fall risk but has a CHA2DS2-VASc of 5.  Currently on IV heparin  per cardiology - Cardiology now planning for TEE guided cardioversion tentatively tomorrow. - Continue to monitor on telemetry  CAD - Echocardiogram with global hypokinesis and suspected ischemic heart disease - LHC 7/15: Heavily calcified coronary arteries with moderate to severe disease in multiple territories not well-suited to intervention.  Patient denies any angina. - For now cardiology is recommending medical therapy  Acute hypoxic  respiratory failure - Resolved after IV diuresis and IV steroids - Aeration much improved - Continue tapering steroids.  EtOH abuse - 3 shots whiskey daily - On arrival patient is tremulous and tachycardic.  Initially concern for active withdrawal but this appears to be baseline. - Patient continues to exhibit fingertip tremor and intermittent tachycardia more likely related to atrial fibrillation - Was on CIWA protocol but has not required any additional Ativan .  CIWA has been discontinued now - Patient has been advised to discontinue alcohol in the setting of CHF, CAD, and initiation of anticoagulation.  He endorses understanding.    Tobacco abuse - Continue nicotine  patch   COPD - Now off of oxygen - Continue steroid taper - Continue with DuoNebs - Resume home dose inhalers   Hypertension - Resume home meds   Scalp laceration - Repaired in ED with 1 staple - Remove stable outpatient within 1 week   Fall - Sustained fall off his kitchen chair where he was sleeping. - Head CT negative - PT/OT evals   DVT prophylaxis: Heparin     Code Status: Full Code Disposition: Planning for TEE and possible cardioversion tomorrow  Consultants:  Treatment Team:  Consulting Physician: Darliss Rogue, MD  Procedures:    Antimicrobials:  Anti-infectives (From admission, onward)    None       Data Reviewed: I have personally reviewed following labs and imaging studies CBC: Recent Labs  Lab 01/14/24 0043 01/15/24 0554 01/16/24 0302 01/17/24 0611 01/18/24 0347 01/18/24 1134  WBC 6.1 5.3 5.6 6.9 6.6  --   NEUTROABS  --   --  3.1 4.9 3.6  --   HGB 15.7 13.8 13.1 13.5 14.2 13.9  HCT 47.0 40.5 39.3 39.7 42.0 41.0  MCV 90.7 91.6 91.2 89.8 91.1  --   PLT 163 150 151 145* 141*  --    Basic Metabolic Panel: Recent Labs  Lab 01/14/24 0043 01/15/24 0554 01/16/24 0302 01/17/24 0611 01/18/24 0347 01/18/24 1134  NA 138 137 136 134* 137 136  K 4.1 3.0* 3.1* 3.7 3.6  3.6  CL 96* 97* 98 97* 97*  --   CO2 30 32 29 29 32  --   GLUCOSE 131* 107* 104* 116* 87  --   BUN 8 10 11 17 21   --   CREATININE 0.93 0.74 0.88 0.92 0.93  --   CALCIUM  9.2 8.6* 8.6* 9.1 9.1  --   MG  --   --  2.0 2.0 2.1  --   PHOS  --   --  3.6 2.9 4.0  --    GFR: Estimated Creatinine Clearance: 69.8 mL/min (by C-G formula based on SCr of 0.93 mg/dL). Liver Function Tests: Recent Labs  Lab 01/14/24 0043 01/15/24 0554 01/16/24 0302 01/17/24 0611 01/18/24 0347  AST 20 17 16 17  33  ALT 17 15 15 16  32  ALKPHOS 73 51 51 49 49  BILITOT 1.1 1.4* 0.9 1.0 0.9  PROT 7.1 6.0* 6.4* 6.7 6.8  ALBUMIN  3.9 3.3* 3.4* 3.6 3.8   CBG: No results for input(s): GLUCAP in the last 168 hours.  Recent Results (from the past 240 hours)  Resp panel by RT-PCR (RSV, Flu A&B, Covid) Anterior Nasal Swab     Status: None   Collection Time: 01/14/24 12:43 AM   Specimen: Anterior Nasal Swab  Result Value Ref Range Status   SARS Coronavirus 2 by RT PCR NEGATIVE NEGATIVE Final    Comment: (NOTE) SARS-CoV-2 target nucleic acids are NOT DETECTED.  The SARS-CoV-2 RNA is generally detectable in upper respiratory specimens during the acute phase of infection. The lowest concentration of SARS-CoV-2 viral copies this assay can detect is 138 copies/mL. A negative result does not preclude SARS-Cov-2 infection and should not be used as the sole basis for treatment or other patient management decisions. A negative result may occur with  improper specimen collection/handling, submission of specimen other than nasopharyngeal swab, presence of viral mutation(s) within the areas targeted by this assay, and inadequate number of viral copies(<138 copies/mL). A negative result must be combined with clinical observations, patient history, and epidemiological information. The expected result is Negative.  Fact Sheet for Patients:  BloggerCourse.com  Fact Sheet for Healthcare Providers:   SeriousBroker.it  This test is no t yet approved or cleared by the [place]  FDA and  has been authorized for detection and/or diagnosis of SARS-CoV-2 by FDA under an Emergency Use Authorization (EUA). This EUA will remain  in effect (meaning this test can be used) for the duration of the COVID-19 declaration under Section 564(b)(1) of the Act, 21 U.S.C.section 360bbb-3(b)(1), unless the authorization is terminated  or revoked sooner.       Influenza A by PCR NEGATIVE NEGATIVE Final   Influenza B by PCR NEGATIVE NEGATIVE Final    Comment: (NOTE) The Xpert Xpress SARS-CoV-2/FLU/RSV plus assay is intended as an aid in the diagnosis of influenza from Nasopharyngeal swab specimens and should not be used as a sole basis for treatment. Nasal washings and aspirates are unacceptable for Xpert Xpress SARS-CoV-2/FLU/RSV testing.  Fact Sheet for Patients: BloggerCourse.com  Fact Sheet for Healthcare Providers: SeriousBroker.it  This test is not yet approved or cleared by the [place]  FDA and has been authorized for detection and/or diagnosis of SARS-CoV-2 by FDA under an Emergency Use Authorization (EUA). This EUA will remain in effect (meaning this test can be used) for the duration of the COVID-19 declaration under Section 564(b)(1) of the Act, 21 U.S.C. section 360bbb-3(b)(1), unless the authorization is terminated or revoked.     Resp Syncytial Virus by PCR NEGATIVE NEGATIVE Final    Comment: (NOTE) Fact Sheet for Patients: BloggerCourse.com  Fact Sheet for Healthcare Providers: SeriousBroker.it  This test is not yet approved or cleared by the [place]  FDA and has been authorized for detection and/or diagnosis of SARS-CoV-2 by FDA under an Emergency Use Authorization (EUA). This EUA will remain in effect (meaning this test can be used) for  the duration of the COVID-19 declaration under Section 564(b)(1) of the Act, 21 U.S.C. section 360bbb-3(b)(1), unless the authorization is terminated or revoked.  Performed at [facility], [address] [place], [place] [address]   Blood culture (routine x 2)     Status: None (Preliminary result)   Collection Time: 01/14/24  4:53 AM   Specimen: BLOOD  Result Value Ref Range Status   Specimen Description BLOOD BLOOD LEFT ARM  Final   Special Requests   Final    BOTTLES DRAWN AEROBIC  AND ANAEROBIC Blood Culture adequate volume   Culture   Final    NO GROWTH 4 DAYS Performed at [facility], [address] [place], [place] [address]    Report Status PENDING  Incomplete  Blood culture (routine x 2)     Status: None (Preliminary result)   Collection Time: 01/14/24  4:54 AM   Specimen: BLOOD  Result Value Ref Range Status   Specimen Description BLOOD BLOOD RIGHT ARM  Final   Special Requests   Final    BOTTLES DRAWN AEROBIC AND ANAEROBIC Blood Culture adequate volume   Culture   Final    NO GROWTH 4 DAYS Performed at [facility], [address], [place], [place] [address]    Report Status PENDING  Incomplete     Radiology Studies: CARDIAC CATHETERIZATION Result Date: 01/18/2024 Conclusions: Multivessel coronary artery disease with heavy calcification, as detailed below, including 70% mid LAD stenosis, chronically occluded first septal branch, sequential 50% & 90% LCx lesions, and tandem 50% RCA stenoses.  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 Mildly elevated left heart filling pressures. Mildly-moderately elevated right heart filling pressures. Normal Fick cardiac output/index. Recommendations: Resume heparin  infusion 2 hours after TR band has been removed; transition to DOAC as soon as tomorrow if there is no evidence of bleeding/vascular injury. Favor medical therapy of multivessel CAD given lack of angina. [name],  MD [facility]    Scheduled Meds:  arformoterol   15 mcg Nebulization BID   And   umeclidinium bromide   1 puff Inhalation Daily   Chlorhexidine  Gluconate Cloth  6 each Topical Daily   folic acid   1 mg Oral Daily   furosemide   40 mg Intravenous Daily   ipratropium-albuterol   3 mL Nebulization BID   metoprolol  tartrate  25 mg Oral Q6H   multivitamin with minerals  1 tablet Oral Daily   predniSONE   20 mg Oral Q breakfast   sodium chloride  flush  10-40 mL Intracatheter Q12H   sodium chloride  flush  3 mL Intravenous Q12H   spironolactone   12.5 mg Oral Daily   thiamine   100 mg Oral Daily   Or   thiamine   100 mg Intravenous Daily   Continuous Infusions:  sodium chloride      heparin        LOS: 3 days  MDM: Patient is high risk for one or more organ failure.  They necessitate ongoing hospitalization for continued IV therapies and subsequent lab monitoring. Total time spent interpreting labs and vitals, reviewing the medical record, coordinating care amongst consultants and care team members, directly assessing and discussing care with the patient and/or family: 55 min  Dorothe Elmore, DO [facility]  To contact the attending physician between 7A-7P please use Epic Chat. To contact the covering physician during after hours 7P-7A, please review Amion.  01/18/2024, 4:44 PM   *This document has been created with the assistance of dictation software. Please excuse typographical errors. *

## 2024-01-18 NOTE — Progress Notes (Signed)
 OT Cancellation Note  Patient Details Name: Terry Garcia MRN: [id number] DOB: [birth year]   Cancelled Treatment:    Reason Eval/Treat Not Completed: Other (comment). Chart reviewed. Pt noted with transfer from 1C to 2A. Given transfer to higher level of care, will require new therapy orders to continue working with the pt. Pt also scheduled for cardiac cath today, verified with RN. Please re-consult as appropriate following the cardiac cath.   Sadeen Wiegel R., MPH, MS, OTR/L ascom [phone] 01/18/24, 8:18 AM

## 2024-01-18 NOTE — Progress Notes (Signed)
 PHARMACY - ANTICOAGULATION CONSULT NOTE  Pharmacy Consult for heparin  Indication: atrial fibrillation  Allergies  Allergen Reactions   Budeson-Glycopyrrol-Formoterol     Other Reaction(s): tongue swelling   Symbicort [Budesonide-Formoterol Fumarate]     Other Reaction(s): ineffective   Advair Diskus [Fluticasone-Salmeterol] Other (See Comments)    Pt states gave him the shakes   Levaquin [Levofloxacin] Other (See Comments)    Pt states made him feel extreme anger     Terry Garcia Measurements: Height: 5' 10 (177.8 cm) Weight: 85.5 kg (188 lb 7.9 oz) IBW/kg (Calculated) : 73 HEPARIN  DW (KG): 85.5  Vital Signs: Temp: 98 F (36.7 C) (07/15 0946) Temp Source: Oral (07/15 0946) BP: 138/85 (07/15 1345) Pulse Rate: 95 (07/15 1345)  Labs: Recent Labs    01/16/24 0302 01/17/24 0611 01/17/24 2159 01/18/24 0347 01/18/24 1134  HGB 13.1 13.5  --  14.2 13.9  HCT 39.3 39.7  --  42.0 41.0  PLT 151 145*  --  141*  --   HEPARINUNFRC  --   --  0.16*  --   --   CREATININE 0.88 0.92  --  0.93  --     Estimated Creatinine Clearance: 69.8 mL/min (by C-G formula based on SCr of 0.93 mg/dL).   Medical History: Past Medical History:  Diagnosis Date   Arterial atherosclerosis    per pt   COPD (chronic obstructive pulmonary disease) (HCC)    controlled with daily inhaler and albuterol  as needed   GERD (gastroesophageal reflux disease)    no meds needed   History of kidney stones    needed open abdominal surgery for stone in 1978, no stones since   Hypertension    amlodipine  and lisinopril/HCTZ   Right bundle branch block    per pt, managed by primary, does not see cardiologist    Medications:  Scheduled:   [MAR Hold] arformoterol   15 mcg Nebulization BID   And   [MAR Hold] umeclidinium bromide   1 puff Inhalation Daily   [MAR Hold] Chlorhexidine  Gluconate Cloth  6 each Topical Daily   [MAR Hold] folic acid   1 mg Oral Daily   furosemide   40 mg Intravenous Daily   [MAR Hold]  ipratropium-albuterol   3 mL Nebulization BID   [MAR Hold] metoprolol  tartrate  25 mg Oral BID   [MAR Hold] multivitamin with minerals  1 tablet Oral Daily   [MAR Hold] potassium chloride   40 mEq Oral Once   [MAR Hold] predniSONE   20 mg Oral Q breakfast   [MAR Hold] sodium chloride  flush  10-40 mL Intracatheter Q12H   sodium chloride  flush  3 mL Intravenous Q12H   spironolactone   12.5 mg Oral Daily   [MAR Hold] thiamine   100 mg Oral Daily   Or   [MAR Hold] thiamine   100 mg Intravenous Daily   Assessment: Terry Garcia presenting after mechanical fall at home - found to be in new onset atrial fibrillation. PMH significant for COPD, tobacco use, CHF with no prior echo on Lasix , alcohol abuse, hypertension. Pharmacy has been consulted to initiate heparin  infusion. Per chart review, Terry Garcia was not on anticoagulation prior to admission.   Goal of Therapy:  Heparin  level 0.3-0.7 units/ml Monitor platelets by anticoagulation protocol: Yes   Plan:  Plan to restart heparin  2 hours after TR band removal. Plan to order heparin  level in 8 hours. CBC daily while on heparin .   Thank you for involving pharmacy in this Terry Garcia's care.   Cathaleen GORMAN Blanch, PharmD, BCPS Clinical Pharmacist  01/18/2024 2:21 PM

## 2024-01-18 NOTE — TOC Progression Note (Signed)
 Transition of Care [facility]) - Progression Note    Patient Details  Name: Terry Garcia MRN: [id number] Date of Birth: [birth year]  [facility] CM/SW Contact  Tomasa JAYSON Childes, RN Phone Number: 01/18/2024, 2:47 PM  Clinical Narrative:    Attempt to speak with patient regarding HH choice. Patient off floor.    Expected Discharge Plan: Home w Home Health Services Barriers to Discharge: Continued Medical Work up  Expected Discharge Plan and Services   Discharge Planning Services: CM Consult Post Acute Care Choice: Home Health Living arrangements for the past 2 months: Single Family Home                                       Social Determinants of Health (SDOH) Interventions SDOH Screenings   Food Insecurity: No Food Insecurity (01/17/2024)  Housing: Unknown (01/17/2024)  Transportation Needs: No Transportation Needs (01/17/2024)  Utilities: Not At Risk (01/15/2024)  Social Connections: Moderately Isolated (01/15/2024)  Tobacco Use: High Risk (01/17/2024)    Readmission Risk Interventions     No data to display

## 2024-01-18 NOTE — Progress Notes (Signed)
 PHARMACY - ANTICOAGULATION CONSULT NOTE  Pharmacy Consult for heparin  Indication: atrial fibrillation  Allergies  Allergen Reactions   Budeson-Glycopyrrol-Formoterol     Other Reaction(s): tongue swelling   Symbicort [Budesonide-Formoterol Fumarate]     Other Reaction(s): ineffective   Advair Diskus [Fluticasone-Salmeterol] Other (See Comments)    Pt states gave him the shakes   Levaquin [Levofloxacin] Other (See Comments)    Pt states made him feel extreme anger     Patient Measurements: Height: 5' 10 (177.8 cm) Weight: 84.7 kg (186 lb 11.7 oz) IBW/kg (Calculated) : 73 HEPARIN  DW (KG): 84.7  Vital Signs: Temp: 98.5 F (36.9 C) (07/14 2327) Temp Source: Oral (07/14 2121) BP: 96/68 (07/14 2327) Pulse Rate: 106 (07/14 2327)  Labs: Recent Labs    01/15/24 0554 01/16/24 0302 01/17/24 0611 01/17/24 2159  HGB 13.8 13.1 13.5  --   HCT 40.5 39.3 39.7  --   PLT 150 151 145*  --   LABPROT 13.9  --   --   --   INR 1.0  --   --   --   HEPARINUNFRC  --   --   --  0.16*  CREATININE 0.74 0.88 0.92  --     Estimated Creatinine Clearance: 70.5 mL/min (by C-G formula based on SCr of 0.92 mg/dL).   Medical History: Past Medical History:  Diagnosis Date   Arterial atherosclerosis    per pt   COPD (chronic obstructive pulmonary disease) (HCC)    controlled with daily inhaler and albuterol  as needed   GERD (gastroesophageal reflux disease)    no meds needed   History of kidney stones    needed open abdominal surgery for stone in 1978, no stones since   Hypertension    amlodipine  and lisinopril/HCTZ   Right bundle branch block    per pt, managed by primary, does not see cardiologist    Medications:  Scheduled:   arformoterol   15 mcg Nebulization BID   And   umeclidinium bromide   1 puff Inhalation Daily   Chlorhexidine  Gluconate Cloth  6 each Topical Daily   folic acid   1 mg Oral Daily   heparin   2,500 Units Intravenous Once   ipratropium-albuterol   3 mL  Nebulization BID   metoprolol  tartrate  25 mg Oral BID   multivitamin with minerals  1 tablet Oral Daily   predniSONE   20 mg Oral Q breakfast   sodium chloride  flush  10-40 mL Intracatheter Q12H   thiamine   100 mg Oral Daily   Or   thiamine   100 mg Intravenous Daily   Assessment: 78 y/o male presenting after mechanical fall at home - found to be in new onset atrial fibrillation. PMH significant for COPD, tobacco use, CHF with no prior echo on Lasix , alcohol abuse, hypertension. Pharmacy has been consulted to initiate heparin  infusion. Per chart review, patient was not on anticoagulation prior to admission.  Baseline labs: hgb 13.5, plt 145, INR 1.0 Last dose SQ heparin  5000 units: 01/17/24 @ 0930  Goal of Therapy:  Heparin  level 0.3-0.7 units/ml Monitor platelets by anticoagulation protocol: Yes   Plan:  7/14:  HL @ 2159 = 0.16, SUBtherapeutic - Will order heparin  2500 units IV X 1 bolus and increase drip rate to 1450 units/hr - Will recheck HL 8 hrs after rate change  Thank you for involving pharmacy in this patient's care.   Nollan Muldrow D Clinical Pharmacist 01/18/2024 12:30 AM

## 2024-01-18 NOTE — Telephone Encounter (Addendum)
 Patient Product/process development scientist completed.    The patient is insured through [employer]. Patient has Medicare and is not eligible for a copay card, but may be able to apply for patient assistance or Medicare RX Payment Plan (Patient Must reach out to their plan, if eligible for payment plan), if available.    Ran test claim for ticagrelor (Brilinta) 90 mg and the current 30 day co-pay is $0.00.  Ran test claim for Eliquis  5 mg and the current 30 day co-pay is $0.00.  Ran test claim for Xarelto  20 mg and the current 30 day co-pay is $0.00.  This test claim was processed through [facility]- copay amounts may vary at other pharmacies due to pharmacy/plan contracts, or as the patient moves through the different stages of their insurance plan.     Reyes Sharps, CPHT Pharmacy Technician III Certified Patient Advocate [facility] Patient Advocate Team Direct Number: [phone]  Fax: [phone]

## 2024-01-19 ENCOUNTER — Encounter: Payer: Self-pay | Admitting: Internal Medicine

## 2024-01-19 ENCOUNTER — Inpatient Hospital Stay: Admitting: Anesthesiology

## 2024-01-19 ENCOUNTER — Other Ambulatory Visit: Payer: Self-pay

## 2024-01-19 ENCOUNTER — Inpatient Hospital Stay (HOSPITAL_COMMUNITY)
Admit: 2024-01-19 | Discharge: 2024-01-19 | Disposition: A | Discharge status: Home / Self Care | Attending: Physician Assistant | Admitting: Physician Assistant

## 2024-01-19 ENCOUNTER — Encounter
Admission: EM | Disposition: A | Discharge status: Home Health | Payer: Self-pay | Source: Home / Self Care | Attending: Family Medicine

## 2024-01-19 DIAGNOSIS — I4891 Unspecified atrial fibrillation: Secondary | ICD-10-CM

## 2024-01-19 DIAGNOSIS — F1090 Alcohol use, unspecified, uncomplicated: Secondary | ICD-10-CM | POA: Diagnosis not present

## 2024-01-19 DIAGNOSIS — I509 Heart failure, unspecified: Secondary | ICD-10-CM | POA: Diagnosis not present

## 2024-01-19 DIAGNOSIS — I5043 Acute on chronic combined systolic (congestive) and diastolic (congestive) heart failure: Secondary | ICD-10-CM

## 2024-01-19 DIAGNOSIS — I25118 Atherosclerotic heart disease of native coronary artery with other forms of angina pectoris: Secondary | ICD-10-CM

## 2024-01-19 DIAGNOSIS — I361 Nonrheumatic tricuspid (valve) insufficiency: Secondary | ICD-10-CM

## 2024-01-19 DIAGNOSIS — I4819 Other persistent atrial fibrillation: Secondary | ICD-10-CM | POA: Diagnosis not present

## 2024-01-19 DIAGNOSIS — I420 Dilated cardiomyopathy: Secondary | ICD-10-CM

## 2024-01-19 DIAGNOSIS — R0609 Other forms of dyspnea: Secondary | ICD-10-CM

## 2024-01-19 DIAGNOSIS — I340 Nonrheumatic mitral (valve) insufficiency: Secondary | ICD-10-CM | POA: Diagnosis not present

## 2024-01-19 DIAGNOSIS — I2510 Atherosclerotic heart disease of native coronary artery without angina pectoris: Secondary | ICD-10-CM | POA: Diagnosis not present

## 2024-01-19 DIAGNOSIS — I5021 Acute systolic (congestive) heart failure: Secondary | ICD-10-CM | POA: Diagnosis not present

## 2024-01-19 HISTORY — PX: CARDIOVERSION: SHX1299

## 2024-01-19 HISTORY — PX: TEE WITHOUT CARDIOVERSION: SHX5443

## 2024-01-19 LAB — POCT I-STAT EG7
Acid-Base Excess: 5 mmol/L — ABNORMAL HIGH (ref 0.0–2.0)
Acid-Base Excess: 5 mmol/L — ABNORMAL HIGH (ref 0.0–2.0)
Bicarbonate: 30.2 mmol/L — ABNORMAL HIGH (ref 20.0–28.0)
Bicarbonate: 30.5 mmol/L — ABNORMAL HIGH (ref 20.0–28.0)
Calcium, Ion: 1.18 mmol/L (ref 1.15–1.40)
Calcium, Ion: 1.19 mmol/L (ref 1.15–1.40)
HCT: 41 % (ref 39.0–52.0)
HCT: 41 % (ref 39.0–52.0)
Hemoglobin: 13.9 g/dL (ref 13.0–17.0)
Hemoglobin: 13.9 g/dL (ref 13.0–17.0)
O2 Saturation: 74 %
O2 Saturation: 75 %
Potassium: 3.5 mmol/L (ref 3.5–5.1)
Potassium: 3.6 mmol/L (ref 3.5–5.1)
Sodium: 136 mmol/L (ref 135–145)
Sodium: 137 mmol/L (ref 135–145)
TCO2: 32 mmol/L (ref 22–32)
TCO2: 32 mmol/L (ref 22–32)
pCO2, Ven: 46 mmHg (ref 44–60)
pCO2, Ven: 46.8 mmHg (ref 44–60)
pH, Ven: 7.422 (ref 7.25–7.43)
pH, Ven: 7.425 (ref 7.25–7.43)
pO2, Ven: 39 mmHg (ref 32–45)
pO2, Ven: 40 mmHg (ref 32–45)

## 2024-01-19 LAB — COMPREHENSIVE METABOLIC PANEL WITH GFR
ALT: 45 U/L — ABNORMAL HIGH (ref 0–44)
AST: 36 U/L (ref 15–41)
Albumin: 3.7 g/dL (ref 3.5–5.0)
Alkaline Phosphatase: 50 U/L (ref 38–126)
Anion gap: 11 (ref 5–15)
BUN: 15 mg/dL (ref 8–23)
CO2: 24 mmol/L (ref 22–32)
Calcium: 9.1 mg/dL (ref 8.9–10.3)
Chloride: 101 mmol/L (ref 98–111)
Creatinine, Ser: 0.78 mg/dL (ref 0.61–1.24)
GFR, Estimated: >60 mL/min (ref >60)
Glucose, Bld: 97 mg/dL (ref 70–99)
Potassium: 4 mmol/L (ref 3.5–5.1)
Sodium: 136 mmol/L (ref 135–145)
Total Bilirubin: 1.3 mg/dL — ABNORMAL HIGH (ref 0.0–1.2)
Total Protein: 6.6 g/dL (ref 6.5–8.1)

## 2024-01-19 LAB — CBC WITH DIFFERENTIAL/PLATELET
Abs Immature Granulocytes: 0.02 K/uL (ref 0.00–0.07)
Basophils Absolute: 0 K/uL (ref 0.0–0.1)
Basophils Relative: 0 %
Eosinophils Absolute: 0.1 K/uL (ref 0.0–0.5)
Eosinophils Relative: 1 %
HCT: 44.1 % (ref 39.0–52.0)
Hemoglobin: 14.3 g/dL (ref 13.0–17.0)
Immature Granulocytes: 0 %
Lymphocytes Relative: 30 %
Lymphs Abs: 1.8 K/uL (ref 0.7–4.0)
MCH: 29.9 pg (ref 26.0–34.0)
MCHC: 32.4 g/dL (ref 30.0–36.0)
MCV: 92.1 fL (ref 80.0–100.0)
Monocytes Absolute: 0.5 K/uL (ref 0.1–1.0)
Monocytes Relative: 8 %
Neutro Abs: 3.7 K/uL (ref 1.7–7.7)
Neutrophils Relative %: 61 %
Platelets: 172 K/uL (ref 150–400)
RBC: 4.79 MIL/uL (ref 4.22–5.81)
RDW: 13.7 % (ref 11.5–15.5)
WBC: 6.1 K/uL (ref 4.0–10.5)
nRBC: 0 % (ref 0.0–0.2)

## 2024-01-19 LAB — CULTURE, BLOOD (ROUTINE X 2)
Culture: NO GROWTH
Culture: NO GROWTH
Special Requests: ADEQUATE
Special Requests: ADEQUATE

## 2024-01-19 LAB — MAGNESIUM: Magnesium: 2.2 mg/dL (ref 1.7–2.4)

## 2024-01-19 LAB — HEPARIN LEVEL (UNFRACTIONATED)
Heparin Unfractionated: 0.23 [IU]/mL — ABNORMAL LOW (ref 0.30–0.70)
Heparin Unfractionated: 0.18 [IU]/mL — ABNORMAL LOW (ref 0.30–0.70)
Heparin Unfractionated: 0.55 [IU]/mL (ref 0.30–0.70)

## 2024-01-19 LAB — PHOSPHORUS: Phosphorus: 3.3 mg/dL (ref 2.5–4.6)

## 2024-01-19 LAB — ECHO TEE

## 2024-01-19 SURGERY — ECHOCARDIOGRAM, TRANSESOPHAGEAL
Anesthesia: General

## 2024-01-19 MED ORDER — BUTAMBEN-TETRACAINE-BENZOCAINE 2-2-14 % EX AERO
INHALATION_SPRAY | CUTANEOUS | Status: AC
  Filled 2024-01-19: qty 5

## 2024-01-19 MED ORDER — METOPROLOL TARTRATE 25 MG PO TABS
25.0000 mg | ORAL_TABLET | Freq: Four times a day (QID) | ORAL | Status: DC
  Administered 2024-01-19 – 2024-01-20 (×4): 25 mg via ORAL
  Filled 2024-01-19 (×4): qty 1

## 2024-01-19 MED ORDER — PROPOFOL 10 MG/ML IV BOLUS
INTRAVENOUS | Status: DC | PRN
  Administered 2024-01-19 (×16): 20 mg via INTRAVENOUS

## 2024-01-19 MED ORDER — IPRATROPIUM-ALBUTEROL 0.5-2.5 (3) MG/3ML IN SOLN
3.0000 mL | Freq: Two times a day (BID) | RESPIRATORY_TRACT | Status: DC
  Administered 2024-01-19 – 2024-01-20 (×2): 3 mL via RESPIRATORY_TRACT
  Filled 2024-01-19 (×2): qty 3

## 2024-01-19 MED ORDER — AMIODARONE LOAD VIA INFUSION
150.0000 mg | Freq: Once | INTRAVENOUS | Status: AC
  Administered 2024-01-19: 150 mg via INTRAVENOUS
  Filled 2024-01-19: qty 83.34

## 2024-01-19 MED ORDER — AMIODARONE HCL IN DEXTROSE 360-4.14 MG/200ML-% IV SOLN
60.0000 mg/h | INTRAVENOUS | Status: DC
  Administered 2024-01-19 (×2): 60 mg/h via INTRAVENOUS
  Filled 2024-01-19: qty 200

## 2024-01-19 MED ORDER — IPRATROPIUM-ALBUTEROL 0.5-2.5 (3) MG/3ML IN SOLN
3.0000 mL | Freq: Four times a day (QID) | RESPIRATORY_TRACT | Status: DC
  Filled 2024-01-19: qty 3

## 2024-01-19 MED ORDER — LIDOCAINE VISCOUS HCL 2 % MT SOLN
OROMUCOSAL | Status: AC
Start: 2024-01-19 — End: 2024-01-19
  Filled 2024-01-19: qty 15

## 2024-01-19 MED ORDER — HEPARIN BOLUS VIA INFUSION
1300.0000 [IU] | Freq: Once | INTRAVENOUS | Status: AC
  Administered 2024-01-19: 1300 [IU] via INTRAVENOUS
  Filled 2024-01-19: qty 1300

## 2024-01-19 MED ORDER — ATORVASTATIN CALCIUM 10 MG PO TABS
10.0000 mg | ORAL_TABLET | Freq: Every day | ORAL | Status: DC
  Administered 2024-01-20: 10 mg via ORAL
  Filled 2024-01-19: qty 1

## 2024-01-19 MED ORDER — AMIODARONE HCL IN DEXTROSE 360-4.14 MG/200ML-% IV SOLN
30.0000 mg/h | INTRAVENOUS | Status: DC
  Administered 2024-01-20: 30 mg/h via INTRAVENOUS
  Filled 2024-01-19 (×2): qty 200

## 2024-01-19 MED ORDER — METOPROLOL TARTRATE 25 MG PO TABS: 50.0000 mg | ORAL_TABLET | Freq: Four times a day (QID) | ORAL | Status: DC

## 2024-01-19 MED ORDER — IPRATROPIUM-ALBUTEROL 0.5-2.5 (3) MG/3ML IN SOLN
3.0000 mL | RESPIRATORY_TRACT | Status: DC | PRN
  Administered 2024-01-19 (×2): 3 mL via RESPIRATORY_TRACT
  Filled 2024-01-19: qty 3

## 2024-01-19 MED ORDER — HEPARIN BOLUS VIA INFUSION
2500.0000 [IU] | Freq: Once | INTRAVENOUS | Status: AC
  Administered 2024-01-19: 2500 [IU] via INTRAVENOUS
  Filled 2024-01-19: qty 2500

## 2024-01-19 NOTE — Transfer of Care (Signed)
 Immediate Anesthesia Transfer of Care Note  Patient: Terry Garcia  Procedure(s) Performed: ECHOCARDIOGRAM, TRANSESOPHAGEAL CARDIOVERSION  Patient Location: PACU  Anesthesia Type:General  Level of Consciousness: sedated  Airway & Oxygen Therapy: Patient Spontanous Breathing and Patient connected to nasal cannula oxygen  Post-op Assessment: Report given to RN and Post -op Vital signs reviewed and stable  Post vital signs: Reviewed and stable  Last Vitals:  Vitals Value Taken Time  BP 116/64 01/19/24 13:35  Temp    Pulse 86 01/19/24 13:37  Resp 21 01/19/24 13:37  SpO2 94 % 01/19/24 13:37  Vitals shown include unfiled device data.  Last Pain:  Vitals:   01/19/24 1253  TempSrc: Oral  PainSc: 0-No pain         Complications: No notable events documented.

## 2024-01-19 NOTE — Progress Notes (Signed)
 Heart Failure Stewardship Pharmacy Note  PCP: Loreli Kins, MD PCP-Cardiologist: Deatrice Cage, MD  HPI: Terry Garcia is a 78 y.o. male with COPD, tobacco abuse, CHF on Lasix , EtOH abuse who presented after a fall in his home. Patient also reported gradual worsening of shortness of breath and lower extremity edema over the past several weeks. On admission, BNP was 401, HS-troponin was 25, and lactic acid was 1.4. Chest x-ray noted no active disease. CT head noted no acute abnormality. CTA noted negative for PE and positive for COPD and trace right pleural effusion. Echocardiogram this admission showed LVEF of 35-40%, grade I diastolic dysfunction and mildly reduced RV function. LHC 01/18/24 with 70% mid LAD, 100% S1 filled by RPDA collaterals, 50% mid Cx, 90% distal Cx, mild diffuse disease throughout RCA, 80% RPAV. RHC 01/18/24 showed RA of 10, PCWP of 24, CO 7.5, CI 3.6. TEE guided DCCV planned today.  Pertinent Lab Values: Creatinine, Ser  Date Value Ref Range Status  01/19/2024 0.78 0.61 - 1.24 mg/dL Final   BUN  Date Value Ref Range Status  01/19/2024 15 8 - 23 mg/dL Final   Potassium  Date Value Ref Range Status  01/19/2024 4.0 3.5 - 5.1 mmol/L Final   Sodium  Date Value Ref Range Status  01/19/2024 136 135 - 145 mmol/L Final   B Natriuretic Peptide  Date Value Ref Range Status  01/14/2024 401.0 (H) 0.0 - 100.0 pg/mL Final    Comment:    Performed at [facility], [address] [place], [place] [address]   Magnesium  Date Value Ref Range Status  01/19/2024 2.2 1.7 - 2.4 mg/dL Final    Comment:    Performed at [facility], [address] [place], [place] [address]    Vital Signs:  Temp:  [97.8 F (36.6 C)-98.4 F (36.9 C)] 97.8 F (36.6 C) (07/16 0821) Pulse Rate:  [77-115] 78 (07/16 0821) Cardiac Rhythm: Atrial fibrillation (07/15 2018) Resp:  [8-22] 18 (07/16 0549) BP: (98-160)/(55-139) 140/85 (07/16 0821) SpO2:  [89 %-97 %] 94 %  (07/16 0821)  Intake/Output Summary (Last 24 hours) at 01/19/2024 1010 Last data filed at 01/19/2024 0909 Gross per 24 hour  Intake 602.76 ml  Output 1075 ml  Net -472.24 ml    Current Heart Failure Medications:  Loop diuretic: none Beta-Blocker: metoprolol  tartrate 25 mg BID ACEI/ARB/ARNI: irbesartan  150 mg daily MRA: none SGLT2i: none Other: none  Prior to admission Heart Failure Medications:  Loop diuretic: furosemide  20 mg daily Beta-Blocker: none ACEI/ARB/ARNI: olmesartan  20 mg daily MRA: none SGLT2i: none Other: amlodipine  10 mg daily  Assessment: 1. 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 NYHA class II-III symptoms.  -Symptoms: Shortness of breath is improved. Appetite is fair. LEE has also improved now mild but persistent. Positive for orthopnea. -Volume: Patient diuresed with IV Lasix , held prior to cath, now resumed with evidence of volume overload. JVP remains elevated. RA pressure on cath yesterday was 10 mmHg and PCWP 24. With higher filling pressures, would continue IV diuresis and may benefit from BID dosing. -Hemodynamics: BP has steadily increasing after holding several BP meds. HR 70s. -BB: Currently on metoprolol  tartrate 25 mg q6h. Consider conversion to metoprolol  succinate prior to discharge. Patient may require dose reduction post DCCV. -ACEI/ARB/ARNI: ARB held with hypotension. Can consider resuming prior to discharge at lower dose. -MRA: Can consider increasing spironolactone  to 25 mg daily given hypervolemia  and relatively low impact on BP. -SGLT2i: Can consider adding today given hypervolemia relatively low impact on BP.  Plan: 1) Medication changes recommended at this time: -Consider increasing spironolactone  to 25 mg daily -Consider adding Farxiga  10 mg daily  2) Patient assistance: -Copays for Entresto, Farxiga , and Jardiance are $0  3) Education: -To be  completed prior to discharge.  Medication Assistance / Insurance Benefits Check: Does the patient have prescription insurance?    Type of insurance plan:  Does the patient qualify for medication assistance through manufacturers or grants? No   Outpatient Pharmacy: Prior to admission outpatient pharmacy: [facility]      Please do not hesitate to reach out with questions or concerns,  Jaun Bash, PharmD, CPP, BCPS, [facility] Heart Failure Pharmacist  Phone - [phone] 01/19/2024 10:10 AM

## 2024-01-19 NOTE — Progress Notes (Signed)
 Transesophageal Echocardiogram :  Indication: Atrial fibrillation with RVR acute Requesting/ordering  physician:   Procedure: Benzocaine  spray x2 and 2 mls x 2 of viscous lidocaine  were given orally to provide local anesthesia to the oropharynx. The patient was positioned supine on the left side, bite block provided. The patient was moderately sedated with the doses of versed  and fentanyl  as detailed below.  Using digital technique an omniplane probe was advanced into the distal esophagus without incident.   Moderate sedation: 1. Sedation used: Propofol  per anesthesia  See report in EPIC  for complete details: In brief, transgastric imaging revealed moderately reduced LV function with global hypokinesis,  no mural apical thrombus.  .  Estimated ejection fraction was 35-40%.  Right sided cardiac chambers with  mildly reduced function with no evidence of pulmonary hypertension.  Imaging of the septum showed no ASD or VSD Bubble study was negative for shunt 2D and color flow confirmed no PFO  The LA was well visualized in orthogonal views.  There was no spontaneous contrast and no thrombus in the LA and LA appendage   The descending thoracic aorta had no  mural aortic debris with no evidence of aneurysmal dilation or dissection  Mild diffuse aortic atherosclerosis aortic arch, descending aorta  Cardioversion to follow   Peachie Barkalow 01/19/2024 2:09 PM

## 2024-01-19 NOTE — Progress Notes (Signed)
 PHARMACY - ANTICOAGULATION CONSULT NOTE  Pharmacy Consult for heparin  Indication: atrial fibrillation  Allergies  Allergen Reactions   Budeson-Glycopyrrol-Formoterol     Other Reaction(s): tongue swelling   Symbicort [Budesonide-Formoterol Fumarate]     Other Reaction(s): ineffective   Advair Diskus [Fluticasone-Salmeterol] Other (See Comments)    Pt states gave him the shakes   Levaquin [Levofloxacin] Other (See Comments)    Pt states made him feel extreme anger     Patient Measurements: Height: 5' 10 (177.8 cm) Weight: 85.5 kg (188 lb 7.9 oz) IBW/kg (Calculated) : 73 HEPARIN  DW (KG): 85.5  Vital Signs: Temp: 98.4 F (36.9 C) (07/15 2323) Temp Source: Oral (07/15 2323) BP: 117/71 (07/15 2323) Pulse Rate: 91 (07/15 2323)  Labs: Recent Labs    01/17/24 0611 01/17/24 2159 01/18/24 0347 01/18/24 1134 01/19/24 0406  HGB 13.5  --  14.2 13.9 14.3  HCT 39.7  --  42.0 41.0 44.1  PLT 145*  --  141*  --  172  HEPARINUNFRC  --  0.16*  --   --  0.23*  CREATININE 0.92  --  0.93  --   --     Estimated Creatinine Clearance: 69.8 mL/min (by C-G formula based on SCr of 0.93 mg/dL).   Medical History: Past Medical History:  Diagnosis Date   Arterial atherosclerosis    per pt   COPD (chronic obstructive pulmonary disease) (HCC)    controlled with daily inhaler and albuterol  as needed   GERD (gastroesophageal reflux disease)    no meds needed   History of kidney stones    needed open abdominal surgery for stone in 1978, no stones since   Hypertension    amlodipine  and lisinopril/HCTZ   Right bundle branch block    per pt, managed by primary, does not see cardiologist    Medications:  Scheduled:   arformoterol   15 mcg Nebulization BID   And   umeclidinium bromide   1 puff Inhalation Daily   Chlorhexidine  Gluconate Cloth  6 each Topical Daily   folic acid   1 mg Oral Daily   furosemide   40 mg Intravenous Daily   heparin   1,300 Units Intravenous Once    ipratropium-albuterol   3 mL Nebulization QID   metoprolol  tartrate  25 mg Oral Q6H   multivitamin with minerals  1 tablet Oral Daily   predniSONE   20 mg Oral Q breakfast   sodium chloride  flush  10-40 mL Intracatheter Q12H   sodium chloride  flush  3 mL Intravenous Q12H   spironolactone   12.5 mg Oral Daily   thiamine   100 mg Oral Daily   Or   thiamine   100 mg Intravenous Daily   Assessment: 78 y/o male presenting after mechanical fall at home - found to be in new onset atrial fibrillation. PMH significant for COPD, tobacco use, CHF with no prior echo on Lasix , alcohol abuse, hypertension. Pharmacy has been consulted to initiate heparin  infusion. Per chart review, patient was not on anticoagulation prior to admission.   Goal of Therapy:  Heparin  level 0.3-0.7 units/ml Monitor platelets by anticoagulation protocol: Yes   Plan:  7/16:  HL @ 0406 = 0.23, SUBtherapeutic - Will order heparin  1300 units IV X 1 bolus and increase drip rate to 1600 units/hr - Will recheck HL 8 hrs after rate change  Thank you for involving pharmacy in this patient's care.   Silver Selinda BIRCH, PharmD Clinical Pharmacist 01/19/2024 4:59 AM

## 2024-01-19 NOTE — Anesthesia Preprocedure Evaluation (Signed)
 Anesthesia Evaluation  Patient identified by MRN, date of birth, ID band Patient awake    Reviewed: Allergy & Precautions, H&P , NPO status , Patient's Chart, lab work & pertinent test results, reviewed documented beta blocker date and time   History of Anesthesia Complications Negative for: history of anesthetic complications  Airway Mallampati: II  TM Distance: >3 FB Neck ROM: full    Dental  (+) Edentulous Upper, Edentulous Lower, Dental Advidsory Given   Pulmonary shortness of breath and with exertion, neg sleep apnea, COPD,  COPD inhaler, neg recent URI, Current Smoker and Patient abstained from smoking.   Pulmonary exam normal breath sounds clear to auscultation       Cardiovascular Exercise Tolerance: Good hypertension, (-) angina + CAD and +CHF  (-) Past MI and (-) Cardiac Stents + dysrhythmias Atrial Fibrillation (-) Valvular Problems/Murmurs Rhythm:regular Rate:Normal  ECHO 01/16/24: 1. Left ventricular ejection fraction, by estimation, is 35 to 40%. The left ventricle has moderately decreased function. The left ventricle demonstrates global hypokinesis. Left ventricular diastolic parameters are consistent with Grade I diastolic dysfunction (impaired relaxation).   2. Right ventricular systolic function is mildly reduced. The right ventricular size is mildly enlarged. Mildly increased right ventricular wall thickness.   3. Left atrial size was mildly dilated.   4. Right atrial size was moderately dilated.   5. The mitral valve is normal in structure. No evidence of mitral valve regurgitation.   6. The aortic valve is normal in structure. Aortic valve regurgitation is not visualized.    Neuro/Psych negative neurological ROS  negative psych ROS   GI/Hepatic Neg liver ROS,GERD  ,,  Endo/Other  negative endocrine ROS    Renal/GU negative Renal ROS  negative genitourinary   Musculoskeletal   Abdominal   Peds   Hematology negative hematology ROS (+)   Anesthesia Other Findings Past Medical History: No date: Arterial atherosclerosis     Comment:  per pt No date: COPD (chronic obstructive pulmonary disease) (HCC)     Comment:  controlled with daily inhaler and albuterol  as needed No date: GERD (gastroesophageal reflux disease)     Comment:  no meds needed No date: History of kidney stones     Comment:  needed open abdominal surgery for stone in 1978, no               stones since No date: Hypertension     Comment:  amlodipine  and lisinopril/HCTZ No date: Right bundle branch block     Comment:  per pt, managed by primary, does not see cardiologist   Reproductive/Obstetrics negative OB ROS                              Anesthesia Physical Anesthesia Plan  ASA: 3  Anesthesia Plan: General   Post-op Pain Management:    Induction: Intravenous  PONV Risk Score and Plan: 1 and Propofol  infusion, TIVA and Treatment may vary due to age or medical condition  Airway Management Planned: Natural Airway and Nasal Cannula  Additional Equipment:   Intra-op Plan:   Post-operative Plan:   Informed Consent: I have reviewed the patients History and Physical, chart, labs and discussed the procedure including the risks, benefits and alternatives for the proposed anesthesia with the patient or authorized representative who has indicated his/her understanding and acceptance.     Dental Advisory Given  Plan Discussed with: Anesthesiologist, CRNA and Surgeon  Anesthesia Plan Comments:  Anesthesia Quick Evaluation

## 2024-01-19 NOTE — Progress Notes (Addendum)
 PHARMACY - ANTICOAGULATION CONSULT NOTE  Pharmacy Consult for heparin  Indication: atrial fibrillation  Allergies  Allergen Reactions   Budeson-Glycopyrrol-Formoterol     Other Reaction(s): tongue swelling   Symbicort [Budesonide-Formoterol Fumarate]     Other Reaction(s): ineffective   Advair Diskus [Fluticasone-Salmeterol] Other (See Comments)    Pt states gave him the shakes   Levaquin [Levofloxacin] Other (See Comments)    Pt states made him feel extreme anger     Patient Measurements: Height: 5' 10 (177.8 cm) Weight: 85.5 kg (188 lb 7.9 oz) IBW/kg (Calculated) : 73 HEPARIN  DW (KG): 85.5  Vital Signs: Temp: 97.7 F (36.5 C) (07/16 1434) Temp Source: Oral (07/16 1434) BP: 150/80 (07/16 1434) Pulse Rate: 86 (07/16 1434)  Labs: Recent Labs    01/17/24 0611 01/17/24 2159 01/18/24 0347 01/18/24 1131 01/18/24 1134 01/18/24 1136 01/19/24 0406 01/19/24 1413  HGB 13.5  --  14.2   < > 13.9 13.9 14.3  --   HCT 39.7  --  42.0   < > 41.0 41.0 44.1  --   PLT 145*  --  141*  --   --   --  172  --   HEPARINUNFRC  --  0.16*  --   --   --   --  0.23* 0.18*  CREATININE 0.92  --  0.93  --   --   --  0.78  --    < > = values in this interval not displayed.    Estimated Creatinine Clearance: 81.1 mL/min (by C-G formula based on SCr of 0.78 mg/dL).   Medical History: Past Medical History:  Diagnosis Date   Arterial atherosclerosis    per pt   COPD (chronic obstructive pulmonary disease) (HCC)    controlled with daily inhaler and albuterol  as needed   GERD (gastroesophageal reflux disease)    no meds needed   History of kidney stones    needed open abdominal surgery for stone in 1978, no stones since   Hypertension    amlodipine  and lisinopril/HCTZ   Right bundle branch block    per pt, managed by primary, does not see cardiologist    Medications:  Scheduled:   amiodarone   150 mg Intravenous Once   arformoterol   15 mcg Nebulization BID   And   umeclidinium  bromide  1 puff Inhalation Daily   [START ON 01/20/2024] atorvastatin   10 mg Oral Daily   Chlorhexidine  Gluconate Cloth  6 each Topical Daily   folic acid   1 mg Oral Daily   furosemide   40 mg Intravenous Daily   ipratropium-albuterol   3 mL Nebulization BID   metoprolol  tartrate  25 mg Oral Q6H   multivitamin with minerals  1 tablet Oral Daily   predniSONE   20 mg Oral Q breakfast   sodium chloride  flush  10-40 mL Intracatheter Q12H   sodium chloride  flush  3 mL Intravenous Q12H   spironolactone   12.5 mg Oral Daily   thiamine   100 mg Oral Daily   Or   thiamine   100 mg Intravenous Daily   Assessment: 78 y/o male presenting after mechanical fall at home - found to be in new onset atrial fibrillation. PMH significant for COPD, tobacco use, CHF with no prior echo on Lasix , alcohol abuse, hypertension. Pharmacy has been consulted to initiate heparin  infusion. Per chart review, patient was not on anticoagulation prior to admission. S/p unsuccessful cardioversion 7/16.   0716 1413 HL HL 0.18, per RN, heparin  may have been stopped for about 5  mins and then restarted.    Goal of Therapy:  Heparin  level 0.3-0.7 units/ml Monitor platelets by anticoagulation protocol: Yes   Plan:  Heparin  level is supratherapeutic. Will give heparin  bolus of 2500 units x 1 and increase heparin  infusion to 1850 units/hr. Recheck heparin  level in 8 hours. CBC daily while on heparin .    Thank you for involving pharmacy in this patient's care.   Cathaleen GORMAN Blanch, PharmD Clinical Pharmacist 01/19/2024 2:57 PM

## 2024-01-19 NOTE — CV Procedure (Signed)
 Cardioversion procedure note For atrial fibrillation, persistent.  Procedure Details:  Consent: Risks of procedure as well as the alternatives and risks of each were explained to the (patient/caregiver).  Consent for procedure obtained.  Time Out: Verified patient identification, verified procedure, site/side was marked, verified correct patient position, special equipment/implants available, medications/allergies/relevent history reviewed, required imaging and test results available.  Performed  Patient placed on cardiac monitor, pulse oximetry, supplemental oxygen as necessary. Difficult sedation process given history of alcohol Sedation given per anesthesia: propofol  IV, Dr. Dario Pacer pads placed anterior/posterior chest and antero-lateral   Cardioverted 4 time(s).   Cardioverted at  150-200J. Synchronized biphasic Anterior-posterior and antero-lateral Did not convert to normal sinus rhythm   Evaluation: Findings: Post procedure EKG shows: Atrial fibrillation/flutter Complications: None Patient did tolerate procedure well.  Time Spent Directly with the Patient:  45 minutes   Tim Brielyn Bosak, M.D., Ph.D.

## 2024-01-19 NOTE — TOC Progression Note (Incomplete)
 Transition of Care [facility]) - Progression Note    Patient Details  Name: Terry Garcia MRN: [id number] Date of Birth: 1946/03/05  [facility] CM/SW Contact  Tomasa JAYSON Childes, RN Phone Number: 01/19/2024, 3:02 PM  Clinical Narrative:    Spoke with patient at the bedside regarding therapy's recommendation for [facility]. Patient stated I don't know why I need it but I guess so. Patient stated he worked with a individual named [facility] that he would like to work with again. Patient will ask his son about the name of the agency.    4:15pm Per Shaun at [employer] patient insurance is out of network.     Expected Discharge Plan: Home w Home Health Services Barriers to Discharge: Continued Medical Work up  Expected Discharge Plan and Services   Discharge Planning Services: CM Consult Post Acute Care Choice: Home Health Living arrangements for the past 2 months: Single Family Home                                       Social Determinants of Health (SDOH) Interventions SDOH Screenings   Food Insecurity: No Food Insecurity (01/17/2024)  Housing: Unknown (01/17/2024)  Transportation Needs: No Transportation Needs (01/17/2024)  Utilities: Not At Risk (01/15/2024)  Social Connections: Moderately Isolated (01/15/2024)  Tobacco Use: High Risk (01/19/2024)    Readmission Risk Interventions     No data to display

## 2024-01-19 NOTE — Progress Notes (Signed)
 PT Cancellation Note  Patient Details Name: Terry Garcia MRN: [id number] DOB: 1946-04-06   Cancelled Treatment:    Reason Eval/Treat Not Completed: Other (comment)  Doyal Shams PT, DPT 1:38 PM,01/19/24

## 2024-01-19 NOTE — Progress Notes (Signed)
 PHARMACY - ANTICOAGULATION CONSULT NOTE  Pharmacy Consult for heparin  Indication: atrial fibrillation  Allergies  Allergen Reactions   Budeson-Glycopyrrol-Formoterol     Other Reaction(s): tongue swelling   Symbicort [Budesonide-Formoterol Fumarate]     Other Reaction(s): ineffective   Advair Diskus [Fluticasone-Salmeterol] Other (See Comments)    Pt states gave him the shakes   Levaquin [Levofloxacin] Other (See Comments)    Pt states made him feel extreme anger     Patient Measurements: Height: 5' 10 (177.8 cm) Weight: 85.5 kg (188 lb 7.9 oz) IBW/kg (Calculated) : 73 HEPARIN  DW (KG): 85.5  Vital Signs: Temp: 97.8 F (36.6 C) (07/16 2326) Temp Source: Oral (07/16 2326) BP: 123/68 (07/16 2326) Pulse Rate: 82 (07/16 2326)  Labs: Recent Labs    01/17/24 0611 01/17/24 2159 01/18/24 0347 01/18/24 1131 01/18/24 1134 01/18/24 1136 01/19/24 0406 01/19/24 1413 01/19/24 2245  HGB 13.5  --  14.2   < > 13.9 13.9 14.3  --   --   HCT 39.7  --  42.0   < > 41.0 41.0 44.1  --   --   PLT 145*  --  141*  --   --   --  172  --   --   HEPARINUNFRC  --    < >  --   --   --   --  0.23* 0.18* 0.55  CREATININE 0.92  --  0.93  --   --   --  0.78  --   --    < > = values in this interval not displayed.    Estimated Creatinine Clearance: 81.1 mL/min (by C-G formula based on SCr of 0.78 mg/dL).   Medical History: Past Medical History:  Diagnosis Date   Arterial atherosclerosis    per pt   COPD (chronic obstructive pulmonary disease) (HCC)    controlled with daily inhaler and albuterol  as needed   GERD (gastroesophageal reflux disease)    no meds needed   History of kidney stones    needed open abdominal surgery for stone in 1978, no stones since   Hypertension    amlodipine  and lisinopril/HCTZ   Right bundle branch block    per pt, managed by primary, does not see cardiologist    Medications:  Scheduled:   arformoterol   15 mcg Nebulization BID   And   umeclidinium  bromide  1 puff Inhalation Daily   [START ON 01/20/2024] atorvastatin   10 mg Oral Daily   Chlorhexidine  Gluconate Cloth  6 each Topical Daily   folic acid   1 mg Oral Daily   furosemide   40 mg Intravenous Daily   ipratropium-albuterol   3 mL Nebulization BID   metoprolol  tartrate  25 mg Oral Q6H   multivitamin with minerals  1 tablet Oral Daily   predniSONE   20 mg Oral Q breakfast   sodium chloride  flush  10-40 mL Intracatheter Q12H   sodium chloride  flush  3 mL Intravenous Q12H   spironolactone   12.5 mg Oral Daily   thiamine   100 mg Oral Daily   Or   thiamine   100 mg Intravenous Daily   Assessment: 78 y/o male presenting after mechanical fall at home - found to be in new onset atrial fibrillation. PMH significant for COPD, tobacco use, CHF with no prior echo on Lasix , alcohol abuse, hypertension. Pharmacy has been consulted to initiate heparin  infusion. Per chart review, patient was not on anticoagulation prior to admission. S/p unsuccessful cardioversion 7/16.   0716 1413 HL HL 0.18,  per RN, heparin  may have been stopped for about 5 mins and then restarted.    Goal of Therapy:  Heparin  level 0.3-0.7 units/ml Monitor platelets by anticoagulation protocol: Yes   Plan:  7/16:  HL @ 2245 = 0.55, therapeutic X 1 - will continue pt on current rate and recheck HL in 8 hrs  - CBC daily   Thank you for involving pharmacy in this patient's care.   Silver Selinda BIRCH, PharmD Clinical Pharmacist 01/19/2024 11:38 PM

## 2024-01-19 NOTE — Progress Notes (Signed)
 Rounding Note   Patient Name: Terry Garcia Date of Encounter: 01/19/2024  [facility] Cardiologist: Deatrice Cage, MD   Subjective No complaints this morning Discussed plan for TEE cardioversion later at lunch Discussed echocardiogram findings detailing reduced ejection fraction Discussed cardiac catheterization findings He denies chest pain concerning for angina Discussed prior intolerances of medication including metoprolol  succinate  Scheduled Meds:  arformoterol   15 mcg Nebulization BID   And   umeclidinium bromide   1 puff Inhalation Daily   Chlorhexidine  Gluconate Cloth  6 each Topical Daily   folic acid   1 mg Oral Daily   furosemide   40 mg Intravenous Daily   ipratropium-albuterol   3 mL Nebulization BID   metoprolol  tartrate  25 mg Oral Q6H   multivitamin with minerals  1 tablet Oral Daily   predniSONE   20 mg Oral Q breakfast   sodium chloride  flush  10-40 mL Intracatheter Q12H   sodium chloride  flush  3 mL Intravenous Q12H   spironolactone   12.5 mg Oral Daily   thiamine   100 mg Oral Daily   Or   thiamine   100 mg Intravenous Daily   Continuous Infusions:  sodium chloride      sodium chloride  20 mL/hr at 01/19/24 0738   heparin  1,600 Units/hr (01/19/24 0505)   PRN Meds: sodium chloride , ipratropium-albuterol , ondansetron  **OR** ondansetron  (ZOFRAN ) IV, mouth rinse, senna-docusate, sodium chloride  flush   Vital Signs  Vitals:   01/18/24 2323 01/19/24 0549 01/19/24 0748 01/19/24 0821  BP: 117/71 (!) 131/55  (!) 140/85  Pulse: 91 77  78  Resp: 18 18    Temp: 98.4 F (36.9 C) 97.8 F (36.6 C)  97.8 F (36.6 C)  TempSrc: Oral   Oral  SpO2: 93% 92% 92% 94%  Weight:      Height:        Intake/Output Summary (Last 24 hours) at 01/19/2024 1126 Last data filed at 01/19/2024 1050 Gross per 24 hour  Intake 602.76 ml  Output 1475 ml  Net -872.24 ml      01/18/2024    9:46 AM 01/18/2024    5:00 AM 01/17/2024    1:00 PM  Last 3 Weights  Weight  (lbs) 188 lb 7.9 oz 188 lb 8 oz 186 lb 11.7 oz  Weight (kg) 85.5 kg 85.503 kg 84.7 kg      Telemetry Atrial fibrillation rate 80-90- Personally Reviewed  ECG   - Personally Reviewed  Physical Exam  GEN: No acute distress.   Neck: No JVD Cardiac: Irregularly irregular, no murmurs, rubs, or gallops.  Respiratory: Clear to auscultation bilaterally. GI: Soft, nontender, non-distended  MS: No edema; No deformity. Neuro:  Nonfocal  Psych: Normal affect   Labs High Sensitivity Troponin:   Recent Labs  Lab 01/14/24 0043 01/14/24 0452  TROPONINIHS 25* 27*     Chemistry Recent Labs  Lab 01/17/24 0611 01/18/24 0347 01/18/24 1131 01/18/24 1134 01/18/24 1136 01/19/24 0406  NA 134* 137   < > 136 137 136  K 3.7 3.6   < > 3.6 3.6 4.0  CL 97* 97*  --   --   --  101  CO2 29 32  --   --   --  24  GLUCOSE 116* 87  --   --   --  97  BUN 17 21  --   --   --  15  CREATININE 0.92 0.93  --   --   --  0.78  CALCIUM  9.1 9.1  --   --   --  9.1  MG 2.0 2.1  --   --   --  2.2  PROT 6.7 6.8  --   --   --  6.6  ALBUMIN 3.6 3.8  --   --   --  3.7  AST 17 33  --   --   --  36  ALT 16 32  --   --   --  45*  ALKPHOS 49 49  --   --   --  50  BILITOT 1.0 0.9  --   --   --  1.3*  GFRNONAA >60 >60  --   --   --  >60  ANIONGAP 8 8  --   --   --  11   < > = values in this interval not displayed.    Lipids  Recent Labs  Lab 01/17/24 0611  CHOL 128  TRIG 53  HDL 62  LDLCALC 55  CHOLHDL 2.1    Hematology Recent Labs  Lab 01/17/24 0611 01/18/24 0347 01/18/24 1131 01/18/24 1134 01/18/24 1136 01/19/24 0406  WBC 6.9 6.6  --   --   --  6.1  RBC 4.42 4.61  --   --   --  4.79  HGB 13.5 14.2   < > 13.9 13.9 14.3  HCT 39.7 42.0   < > 41.0 41.0 44.1  MCV 89.8 91.1  --   --   --  92.1  MCH 30.5 30.8  --   --   --  29.9  MCHC 34.0 33.8  --   --   --  32.4  RDW 13.4 13.7  --   --   --  13.7  PLT 145* 141*  --   --   --  172   < > = values in this interval not displayed.   Thyroid No  results for input(s): TSH, FREET4 in the last 168 hours.  BNP Recent Labs  Lab 01/14/24 0454  BNP 401.0*    DDimer  Recent Labs  Lab 01/14/24 0454  DDIMER 1.19*     Radiology  CARDIAC CATHETERIZATION Result Date: 01/18/2024 Conclusions: Multivessel coronary artery disease with heavy calcification, as detailed below, including 70% mid LAD stenosis, chronically occluded first septal branch, sequential 50% & 90% LCx lesions, and tandem 50% RCA stenoses.  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 Mildly elevated left heart filling pressures. Mildly-moderately elevated right heart filling pressures. Normal Fick cardiac output/index. Recommendations: Resume heparin  infusion 2 hours after TR band has been removed; transition to DOAC as soon as tomorrow if there is no evidence of bleeding/vascular injury. Favor medical therapy of multivessel CAD given lack of angina. [name], MD [facility]   Cardiac Studies Echo Left ventricular ejection fraction, by estimation, is 35 to 40%. The  left ventricle has moderately decreased function. The left ventricle  demonstrates global hypokinesis. Left ventricular diastolic parameters are  consistent with Grade I diastolic  dysfunction (impaired relaxation).   2. Right ventricular systolic function is mildly reduced. The right  ventricular size is mildly enlarged. Mildly increased right ventricular  wall thickness.   3. Left atrial size was mildly dilated.   4. Right atrial size was moderately dilated.   5. The mitral valve is normal in structure. No evidence of mitral valve  regurgitation.   6. The aortic valve is normal in structure. Aortic valve regurgitation is  not visualized.   Patient Profile   78 y.o. male with recently diagnosed HFrEF, COPD,  tobacco abuse, hypertension, and alcohol abuse, admitted with acute HFrEF and incidentally noted to be in atrial fibrillation with rapid ventricular response.    Assessment & Plan  Acute HFrEF: Echocardiogram with ejection fraction 35 to 40% Cardiac catheterization performed yesterday with multivessel coronary disease, medical management recommended given no significant angina -Plan for TEE cardioversion today  Persistent atrial fibrillation: On metoprolol  tartrate for rate control, heparin  infusion -TEE cardioversion later today Risk and benefit of the procedure discussed with him in detail -Discussed rate and rhythm control medications including metoprolol , NOAC for stroke prevention   Coronary artery disease: Catheterization yesterday detailing heavily calcified coronary arteries with moderate moderate to severe disease in multiple territories, not particularly well-suited to intervention.  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

## 2024-01-19 NOTE — Plan of Care (Signed)

## 2024-01-19 NOTE — Care Management Important Message (Signed)
 Important Message  Patient Details  Name: Terry Garcia MRN: [id number] Date of Birth: 18-Oct-1945   Important Message Given:  Yes - Medicare IM     [name] 01/19/2024, 3:08 PM

## 2024-01-19 NOTE — Progress Notes (Signed)
 PROGRESS NOTE    Terry Garcia  FMW:[id number] DOB: [birth year] DOA: 01/14/2024 PCP: Loreli Kins, MD  Chief Complaint  Patient presents with   [facility] Course:  Terry Garcia is a 78 y.o. male with COPD, tobacco abuse, self-reported CHF on Lasix  (no echo on file), EtOH abuse, who presents to the ED after sustaining a fall.  Patient reports that he was sleeping at his dining room table which is common for him, but he fell off of his chair and onto his left side.  He sustained a skin abrasion to the left elbow and left side of his head.  He did not lose consciousness.  He was able to get up with the help of his family and ambulated immediately after. In the ED he underwent scalp laceration repair with 1 staple. While being worked up he complained of progressive shortness of breath over the last many weeks and was noted to have some peripheral edema.  He was found to be persistently tachycardic in the 110s to 120s.  He was also found to be tremulous. Labs are mostly unremarkable.  BNP elevation of 401.  Mildly elevated D-dimer.  CTA without PE. We are requested for admission for CHF exacerbation.  On my evaluation patient reports that he is only slightly more edematous than his baseline.  He reports he has a cardiologist outpatient and takes daily Lasix .  When discussing his alcohol intake he endorses 3 shots of right liquor daily.  His last drink was 7/10.  7/16: Hemodynamically stable, failed attempt for DCCV even after multiple shocks.  Cardiology now started on amiodarone  infusion.  Subjective: Patient was seen before DCCV.  Denies any chest pain or shortness of breath.  He was getting ready for the procedure.  Objective: Vitals:   01/19/24 1330 01/19/24 1345 01/19/24 1400 01/19/24 1415  BP: 126/66 114/72 122/71 122/76  Pulse: 88 86 69 86  Resp: (!) 23 20 15  (!) 8  Temp:      TempSrc:      SpO2: (!) 88% 93% 92% 92%  Weight:      Height:        Intake/Output Summary  (Last 24 hours) at 01/19/2024 1429 Last data filed at 01/19/2024 1339 Gross per 24 hour  Intake 1052.76 ml  Output 1725 ml  Net -672.24 ml   Filed Weights   01/17/24 1300 01/18/24 0500 01/18/24 0946  Weight: 84.7 kg 85.5 kg 85.5 kg    Examination:  General.  Well-developed elderly man, in no acute distress. Pulmonary.  Lungs clear bilaterally, normal respiratory effort. CV.  Irregularly irregular. Abdomen.  Soft, nontender, nondistended, BS positive. CNS.  Alert and oriented .  No focal neurologic deficit. Extremities.  No edema, no cyanosis, pulses intact and symmetrical. Psychiatry.  Judgment and insight appears normal.   Assessment & Plan:  CHF - Patient endorses history of CHF, though no echocardiogram on file - Takes 20 mg Lasix  daily at home, reports he has been waiting for cardiology appointment. - Echocardiogram here: LVEF 35 to 40%, global hypokinesis, grade 1 diastolic dysfunction - Continue current diuresis with 40 mg IV, spironolactone  added today by cardiology.  Kidney function is tolerating - Consider SGLT2 with possible Entresto if affordable - Continue to follow strict I's and O's, these are not being recorded appropriately.  Patient does endorse diuresis.   - Low-sodium diet  Atrial fibrillation -Failed DCCV today - Intermittent throughout this admission.  New diagnosis - Metoprolol  every 6 hours. -  Currently on heparin  with plan to transition to DOAC. - Patient is high fall risk but has a CHA2DS2-VASc of 5.  Currently on IV heparin  per cardiology - Cardiology started him on amiodarone  infusion. -Planning to reattempt DCCV in 3 weeks. - Continue to monitor on telemetry  CAD - Echocardiogram with global hypokinesis and suspected ischemic heart disease - LHC 7/15: Heavily calcified coronary arteries with moderate to severe disease in multiple territories not well-suited to intervention.  Patient denies any angina. - For now cardiology is recommending medical  therapy  Acute hypoxic respiratory failure - Resolved after IV diuresis and IV steroids - Aeration much improved - Continue tapering steroids.  EtOH abuse - 3 shots whiskey daily - On arrival patient is tremulous and tachycardic.  Initially concern for active withdrawal but this appears to be baseline. - Patient continues to exhibit fingertip tremor and intermittent tachycardia more likely related to atrial fibrillation - Was on CIWA protocol but has not required any additional Ativan .  CIWA has been discontinued now - Patient has been advised to discontinue alcohol in the setting of CHF, CAD, and initiation of anticoagulation.  He endorses understanding.    Tobacco abuse - Continue nicotine  patch   COPD - Now off of oxygen - Continue steroid taper - Continue with DuoNebs - Resume home dose inhalers   Hypertension - Resume home meds   Scalp laceration - Repaired in ED with 1 staple - Remove stable outpatient within 1 week   Fall - Sustained fall off his kitchen chair where he was sleeping. - Head CT negative - PT/OT -recommending home health   DVT prophylaxis: Heparin     Code Status: Full Code Disposition: Home with home health  Consultants:  Treatment Team:  Consulting Physician: Darliss Rogue, MD  Procedures:  Left heart cath DCCV  Antimicrobials:  Anti-infectives (From admission, onward)    None       Data Reviewed: I have personally reviewed following labs and imaging studies CBC: Recent Labs  Lab 01/15/24 0554 01/16/24 0302 01/17/24 0611 01/18/24 0347 01/18/24 1131 01/18/24 1134 01/18/24 1136 01/19/24 0406  WBC 5.3 5.6 6.9 6.6  --   --   --  6.1  NEUTROABS  --  3.1 4.9 3.6  --   --   --  3.7  HGB 13.8 13.1 13.5 14.2 13.9 13.9 13.9 14.3  HCT 40.5 39.3 39.7 42.0 41.0 41.0 41.0 44.1  MCV 91.6 91.2 89.8 91.1  --   --   --  92.1  PLT 150 151 145* 141*  --   --   --  172   Basic Metabolic Panel: Recent Labs  Lab 01/15/24 0554  01/16/24 0302 01/17/24 0611 01/18/24 0347 01/18/24 1131 01/18/24 1134 01/18/24 1136 01/19/24 0406  NA 137 136 134* 137 136 136 137 136  K 3.0* 3.1* 3.7 3.6 3.5 3.6 3.6 4.0  CL 97* 98 97* 97*  --   --   --  101  CO2 32 29 29 32  --   --   --  24  GLUCOSE 107* 104* 116* 87  --   --   --  97  BUN 10 11 17 21   --   --   --  15  CREATININE 0.74 0.88 0.92 0.93  --   --   --  0.78  CALCIUM  8.6* 8.6* 9.1 9.1  --   --   --  9.1  MG  --  2.0 2.0 2.1  --   --   --  2.2  PHOS  --  3.6 2.9 4.0  --   --   --  3.3   GFR: Estimated Creatinine Clearance: 81.1 mL/min (by C-G formula based on SCr of 0.78 mg/dL). Liver Function Tests: Recent Labs  Lab 01/15/24 0554 01/16/24 0302 01/17/24 0611 01/18/24 0347 01/19/24 0406  AST 17 16 17  33 36  ALT 15 15 16  32 45*  ALKPHOS 51 51 49 49 50  BILITOT 1.4* 0.9 1.0 0.9 1.3*  PROT 6.0* 6.4* 6.7 6.8 6.6  ALBUMIN 3.3* 3.4* 3.6 3.8 3.7   CBG: No results for input(s): GLUCAP in the last 168 hours.  Recent Results (from the past 240 hours)  Resp panel by RT-PCR (RSV, Flu A&B, Covid) Anterior Nasal Swab     Status: None   Collection Time: 01/14/24 12:43 AM   Specimen: Anterior Nasal Swab  Result Value Ref Range Status   SARS Coronavirus 2 by RT PCR NEGATIVE NEGATIVE Final    Comment: (NOTE) SARS-CoV-2 target nucleic acids are NOT DETECTED.  The SARS-CoV-2 RNA is generally detectable in upper respiratory specimens during the acute phase of infection. The lowest concentration of SARS-CoV-2 viral copies this assay can detect is 138 copies/mL. A negative result does not preclude SARS-Cov-2 infection and should not be used as the sole basis for treatment or other patient management decisions. A negative result may occur with  improper specimen collection/handling, submission of specimen other than nasopharyngeal swab, presence of viral mutation(s) within the areas targeted by this assay, and inadequate number of viral copies(<138 copies/mL). A  negative result must be combined with clinical observations, patient history, and epidemiological information. The expected result is Negative.  Fact Sheet for Patients:  BloggerCourse.com  Fact Sheet for Healthcare Providers:  SeriousBroker.it  This test is no t yet approved or cleared by the [place]  FDA and  has been authorized for detection and/or diagnosis of SARS-CoV-2 by FDA under an Emergency Use Authorization (EUA). This EUA will remain  in effect (meaning this test can be used) for the duration of the COVID-19 declaration under Section 564(b)(1) of the Act, 21 U.S.C.section 360bbb-3(b)(1), unless the authorization is terminated  or revoked sooner.       Influenza A by PCR NEGATIVE NEGATIVE Final   Influenza B by PCR NEGATIVE NEGATIVE Final    Comment: (NOTE) The Xpert Xpress SARS-CoV-2/FLU/RSV plus assay is intended as an aid in the diagnosis of influenza from Nasopharyngeal swab specimens and should not be used as a sole basis for treatment. Nasal washings and aspirates are unacceptable for Xpert Xpress SARS-CoV-2/FLU/RSV testing.  Fact Sheet for Patients: BloggerCourse.com  Fact Sheet for Healthcare Providers: SeriousBroker.it  This test is not yet approved or cleared by the [place]  FDA and has been authorized for detection and/or diagnosis of SARS-CoV-2 by FDA under an Emergency Use Authorization (EUA). This EUA will remain in effect (meaning this test can be used) for the duration of the COVID-19 declaration under Section 564(b)(1) of the Act, 21 U.S.C. section 360bbb-3(b)(1), unless the authorization is terminated or revoked.     Resp Syncytial Virus by PCR NEGATIVE NEGATIVE Final    Comment: (NOTE) Fact Sheet for Patients: BloggerCourse.com  Fact Sheet for Healthcare  Providers: SeriousBroker.it  This test is not yet approved or cleared by the [place]  FDA and has been authorized for detection and/or diagnosis of SARS-CoV-2 by FDA under an Emergency Use Authorization (EUA). This EUA will remain in effect (meaning this test can be used) for the duration  of the COVID-19 declaration under Section 564(b)(1) of the Act, 21 U.S.C. section 360bbb-3(b)(1), unless the authorization is terminated or revoked.  Performed at [facility], [address] [place], [place] [address]   Blood culture (routine x 2)     Status: None   Collection Time: 01/14/24  4:53 AM   Specimen: BLOOD  Result Value Ref Range Status   Specimen Description BLOOD BLOOD LEFT ARM  Final   Special Requests   Final    BOTTLES DRAWN AEROBIC AND ANAEROBIC Blood Culture adequate volume   Culture   Final    NO GROWTH 5 DAYS Performed at [facility], [address], [place], [place] [address]    Report Status 01/19/2024 FINAL  Final  Blood culture (routine x 2)     Status: None   Collection Time: 01/14/24  4:54 AM   Specimen: BLOOD  Result Value Ref Range Status   Specimen Description BLOOD BLOOD RIGHT ARM  Final   Special Requests   Final    BOTTLES DRAWN AEROBIC AND ANAEROBIC Blood Culture adequate volume   Culture   Final    NO GROWTH 5 DAYS Performed at [facility], [address] [place], [place] [address]    Report Status 01/19/2024 FINAL  Final     Radiology Studies: CARDIAC CATHETERIZATION Result Date: 01/18/2024 Conclusions: Multivessel coronary artery disease with heavy calcification, as detailed below, including 70% mid LAD stenosis, chronically occluded first septal branch, sequential 50% & 90% LCx lesions, and tandem 50% RCA stenoses.  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 Mildly elevated left heart filling pressures. Mildly-moderately elevated right heart filling pressures. Normal  Fick cardiac output/index. Recommendations: Resume heparin  infusion 2 hours after TR band has been removed; transition to DOAC as soon as tomorrow if there is no evidence of bleeding/vascular injury. Favor medical therapy of multivessel CAD given lack of angina. [name], MD [facility]    Scheduled Meds:  amiodarone   150 mg Intravenous Once   arformoterol   15 mcg Nebulization BID   And   umeclidinium bromide   1 puff Inhalation Daily   [START ON 01/20/2024] atorvastatin   10 mg Oral Daily   Chlorhexidine  Gluconate Cloth  6 each Topical Daily   folic acid   1 mg Oral Daily   furosemide   40 mg Intravenous Daily   ipratropium-albuterol   3 mL Nebulization BID   metoprolol  tartrate  25 mg Oral Q6H   multivitamin with minerals  1 tablet Oral Daily   predniSONE   20 mg Oral Q breakfast   sodium chloride  flush  10-40 mL Intracatheter Q12H   sodium chloride  flush  3 mL Intravenous Q12H   spironolactone   12.5 mg Oral Daily   thiamine   100 mg Oral Daily   Or   thiamine   100 mg Intravenous Daily   Continuous Infusions:  amiodarone      Followed by   amiodarone      heparin  1,600 Units/hr (01/19/24 1143)     LOS: 4 days  MDM: Patient is high risk for one or more organ failure.  They necessitate ongoing hospitalization for continued IV therapies and subsequent lab monitoring. Total time spent interpreting labs and vitals, reviewing the medical record, coordinating care amongst consultants and care team members, directly assessing and discussing care with the patient and/or family: 50 min  Amaryllis Dare, MD [facility]  To contact the attending physician between 7A-7P please use Epic Chat. To contact the covering physician during after hours 7P-7A, please review  Amion.  01/19/2024, 2:29 PM   *This document has been created with the assistance of dictation software. Please excuse typographical errors. *

## 2024-01-19 NOTE — Progress Notes (Signed)
*  PRELIMINARY RESULTS* Echocardiogram Echocardiogram Transesophageal has been performed.  Floydene Harder 01/19/2024, 1:32 PM

## 2024-01-20 ENCOUNTER — Encounter: Payer: Self-pay | Admitting: Cardiovascular Disease

## 2024-01-20 ENCOUNTER — Other Ambulatory Visit: Payer: Self-pay

## 2024-01-20 DIAGNOSIS — I4819 Other persistent atrial fibrillation: Secondary | ICD-10-CM | POA: Diagnosis not present

## 2024-01-20 DIAGNOSIS — I5021 Acute systolic (congestive) heart failure: Secondary | ICD-10-CM | POA: Diagnosis not present

## 2024-01-20 DIAGNOSIS — S51812A Laceration without foreign body of left forearm, initial encounter: Secondary | ICD-10-CM | POA: Diagnosis not present

## 2024-01-20 DIAGNOSIS — I509 Heart failure, unspecified: Secondary | ICD-10-CM | POA: Diagnosis not present

## 2024-01-20 DIAGNOSIS — I2510 Atherosclerotic heart disease of native coronary artery without angina pectoris: Secondary | ICD-10-CM | POA: Diagnosis not present

## 2024-01-20 DIAGNOSIS — F1090 Alcohol use, unspecified, uncomplicated: Secondary | ICD-10-CM | POA: Diagnosis not present

## 2024-01-20 DIAGNOSIS — W19XXXA Unspecified fall, initial encounter: Secondary | ICD-10-CM | POA: Diagnosis not present

## 2024-01-20 DIAGNOSIS — S0990XA Unspecified injury of head, initial encounter: Secondary | ICD-10-CM

## 2024-01-20 DIAGNOSIS — R000 Tachycardia, unspecified: Secondary | ICD-10-CM

## 2024-01-20 LAB — COMPREHENSIVE METABOLIC PANEL WITH GFR
ALT: 54 U/L — ABNORMAL HIGH (ref 0–44)
AST: 36 U/L (ref 15–41)
Albumin: 3.7 g/dL (ref 3.5–5.0)
Alkaline Phosphatase: 50 U/L (ref 38–126)
Anion gap: 11 (ref 5–15)
BUN: 20 mg/dL (ref 8–23)
CO2: 27 mmol/L (ref 22–32)
Calcium: 9.1 mg/dL (ref 8.9–10.3)
Chloride: 97 mmol/L — ABNORMAL LOW (ref 98–111)
Creatinine, Ser: 1.15 mg/dL (ref 0.61–1.24)
GFR, Estimated: >60 mL/min (ref >60)
Glucose, Bld: 101 mg/dL — ABNORMAL HIGH (ref 70–99)
Potassium: 3.5 mmol/L (ref 3.5–5.1)
Sodium: 135 mmol/L (ref 135–145)
Total Bilirubin: 0.8 mg/dL (ref 0.0–1.2)
Total Protein: 6.3 g/dL — ABNORMAL LOW (ref 6.5–8.1)

## 2024-01-20 LAB — CBC WITH DIFFERENTIAL/PLATELET
Abs Immature Granulocytes: 0.04 K/uL (ref 0.00–0.07)
Basophils Absolute: 0 K/uL (ref 0.0–0.1)
Basophils Relative: 0 %
Eosinophils Absolute: 0.1 K/uL (ref 0.0–0.5)
Eosinophils Relative: 1 %
HCT: 40.8 % (ref 39.0–52.0)
Hemoglobin: 13.7 g/dL (ref 13.0–17.0)
Immature Granulocytes: 0 %
Lymphocytes Relative: 23 %
Lymphs Abs: 2.3 K/uL (ref 0.7–4.0)
MCH: 30.4 pg (ref 26.0–34.0)
MCHC: 33.6 g/dL (ref 30.0–36.0)
MCV: 90.7 fL (ref 80.0–100.0)
Monocytes Absolute: 0.8 K/uL (ref 0.1–1.0)
Monocytes Relative: 8 %
Neutro Abs: 6.7 K/uL (ref 1.7–7.7)
Neutrophils Relative %: 68 %
Platelets: 167 K/uL (ref 150–400)
RBC: 4.5 MIL/uL (ref 4.22–5.81)
RDW: 13.9 % (ref 11.5–15.5)
WBC: 10 K/uL (ref 4.0–10.5)
nRBC: 0 % (ref 0.0–0.2)

## 2024-01-20 LAB — PHOSPHORUS: Phosphorus: 3.8 mg/dL (ref 2.5–4.6)

## 2024-01-20 LAB — LIPOPROTEIN A (LPA): Lipoprotein (a): 191.7 nmol/L — ABNORMAL HIGH (ref <75.0)

## 2024-01-20 LAB — HEPARIN LEVEL (UNFRACTIONATED): Heparin Unfractionated: 0.54 [IU]/mL (ref 0.30–0.70)

## 2024-01-20 LAB — MAGNESIUM: Magnesium: 2 mg/dL (ref 1.7–2.4)

## 2024-01-20 MED ORDER — AMIODARONE HCL 200 MG PO TABS
ORAL_TABLET | ORAL | 0 refills | Status: DC
  Filled 2024-01-20: qty 56, 28d supply, fill #0

## 2024-01-20 MED ORDER — METOPROLOL SUCCINATE ER 100 MG PO TB24
100.0000 mg | ORAL_TABLET | Freq: Every day | ORAL | 1 refills | Status: DC
  Filled 2024-01-20: qty 30, 30d supply, fill #0

## 2024-01-20 MED ORDER — ATORVASTATIN CALCIUM 10 MG PO TABS
10.0000 mg | ORAL_TABLET | Freq: Every day | ORAL | 0 refills | Status: DC
  Filled 2024-01-20: qty 30, 30d supply, fill #0

## 2024-01-20 MED ORDER — METOPROLOL SUCCINATE ER 100 MG PO TB24
100.0000 mg | ORAL_TABLET | Freq: Every day | ORAL | Status: DC
  Administered 2024-01-20: 100 mg via ORAL
  Filled 2024-01-20: qty 1

## 2024-01-20 MED ORDER — AMIODARONE HCL 200 MG PO TABS: 200.0000 mg | ORAL_TABLET | Freq: Two times a day (BID) | ORAL | Status: DC

## 2024-01-20 MED ORDER — VITAMIN B-1 100 MG PO TABS
100.0000 mg | ORAL_TABLET | Freq: Every day | ORAL | 1 refills | Status: AC
  Filled 2024-01-20: qty 90, 90d supply, fill #0
  Filled 2024-04-10: qty 90, 90d supply, fill #1

## 2024-01-20 MED ORDER — FUROSEMIDE 40 MG PO TABS: 40.0000 mg | ORAL_TABLET | Freq: Every day | ORAL | Status: DC

## 2024-01-20 MED ORDER — AMIODARONE HCL 200 MG PO TABS
400.0000 mg | ORAL_TABLET | Freq: Two times a day (BID) | ORAL | Status: DC
  Administered 2024-01-20: 400 mg via ORAL
  Filled 2024-01-20: qty 2

## 2024-01-20 MED ORDER — FUROSEMIDE 40 MG PO TABS
40.0000 mg | ORAL_TABLET | Freq: Every day | ORAL | 0 refills | Status: DC
  Filled 2024-01-20: qty 30, 30d supply, fill #0

## 2024-01-20 MED ORDER — LOSARTAN POTASSIUM 25 MG PO TABS
25.0000 mg | ORAL_TABLET | Freq: Every day | ORAL | 0 refills | Status: DC
  Filled 2024-01-20: qty 30, 30d supply, fill #0

## 2024-01-20 MED ORDER — FOLIC ACID 1 MG PO TABS
1.0000 mg | ORAL_TABLET | Freq: Every day | ORAL | 1 refills | Status: AC
  Filled 2024-01-20: qty 90, 90d supply, fill #0
  Filled 2024-04-10: qty 90, 90d supply, fill #1

## 2024-01-20 MED ORDER — LOSARTAN POTASSIUM 25 MG PO TABS
25.0000 mg | ORAL_TABLET | Freq: Every day | ORAL | Status: DC
  Administered 2024-01-20: 25 mg via ORAL
  Filled 2024-01-20: qty 1

## 2024-01-20 MED ORDER — APIXABAN 5 MG PO TABS
5.0000 mg | ORAL_TABLET | Freq: Two times a day (BID) | ORAL | 0 refills | Status: DC
  Filled 2024-01-20: qty 60, 30d supply, fill #0

## 2024-01-20 MED ORDER — APIXABAN 5 MG PO TABS
5.0000 mg | ORAL_TABLET | Freq: Two times a day (BID) | ORAL | Status: DC
  Administered 2024-01-20: 5 mg via ORAL
  Filled 2024-01-20: qty 1

## 2024-01-20 MED ORDER — DAPAGLIFLOZIN PROPANEDIOL 10 MG PO TABS
10.0000 mg | ORAL_TABLET | Freq: Every day | ORAL | 0 refills | Status: DC
  Filled 2024-01-20: qty 30, 30d supply, fill #0

## 2024-01-20 MED ORDER — SPIRONOLACTONE 25 MG PO TABS
12.5000 mg | ORAL_TABLET | Freq: Every day | ORAL | 0 refills | Status: DC
  Filled 2024-01-20: qty 15, 30d supply, fill #0

## 2024-01-20 MED ORDER — DAPAGLIFLOZIN PROPANEDIOL 10 MG PO TABS
10.0000 mg | ORAL_TABLET | Freq: Every day | ORAL | Status: DC
  Administered 2024-01-20: 10 mg via ORAL
  Filled 2024-01-20: qty 1

## 2024-01-20 MED ORDER — AMIODARONE HCL 200 MG PO TABS: 200.0000 mg | ORAL_TABLET | Freq: Every day | ORAL | Status: DC

## 2024-01-20 NOTE — Progress Notes (Signed)
 Rounding Note   Patient Name: Terry Garcia Date of Encounter: 01/20/2024  [facility] Cardiologist: Deatrice Cage, MD   Subjective Discussed events from yesterday, Underwent transesophageal echo and cardioversion Unable to restore normal sinus rhythm Started on amiodarone  infusion This morning reports feeling well, denies significant shortness of breath Remains in atrial fibrillation, rate 80-90 On metoprolol  every 6 hours, heparin  infusion No lower extremity edema  Scheduled Meds:  amiodarone   400 mg Oral BID   Followed by   NOREEN ON 01/27/2024] amiodarone   200 mg Oral BID   Followed by   NOREEN ON 02/03/2024] amiodarone   200 mg Oral Daily   apixaban   5 mg Oral BID   arformoterol   15 mcg Nebulization BID   And   umeclidinium bromide   1 puff Inhalation Daily   atorvastatin   10 mg Oral Daily   Chlorhexidine  Gluconate Cloth  6 each Topical Daily   dapagliflozin  propanediol  10 mg Oral Daily   folic acid   1 mg Oral Daily   [START ON 01/21/2024] furosemide   40 mg Oral Daily   ipratropium-albuterol   3 mL Nebulization BID   losartan   25 mg Oral Daily   metoprolol  succinate  100 mg Oral Daily   multivitamin with minerals  1 tablet Oral Daily   predniSONE   20 mg Oral Q breakfast   sodium chloride  flush  10-40 mL Intracatheter Q12H   sodium chloride  flush  3 mL Intravenous Q12H   spironolactone   12.5 mg Oral Daily   thiamine   100 mg Oral Daily   Or   thiamine   100 mg Intravenous Daily   Continuous Infusions:  PRN Meds: ipratropium-albuterol , ondansetron  **OR** ondansetron  (ZOFRAN ) IV, mouth rinse, senna-docusate, sodium chloride  flush   Vital Signs  Vitals:   01/20/24 0406 01/20/24 0740 01/20/24 0834 01/20/24 1138  BP: (!) 146/81 137/77  138/60  Pulse: 71 65  67  Resp: 16 19  20   Temp: 98.4 F (36.9 C) 98.5 F (36.9 C)  (!) 97.4 F (36.3 C)  TempSrc: Oral Oral  Oral  SpO2: 92% 94% 93% 93%  Weight:      Height:        Intake/Output Summary (Last 24  hours) at 01/20/2024 1215 Last data filed at 01/20/2024 0900 Gross per 24 hour  Intake 1203.15 ml  Output 675 ml  Net 528.15 ml      01/18/2024    9:46 AM 01/18/2024    5:00 AM 01/17/2024    1:00 PM  Last 3 Weights  Weight (lbs) 188 lb 7.9 oz 188 lb 8 oz 186 lb 11.7 oz  Weight (kg) 85.5 kg 85.503 kg 84.7 kg      Telemetry Atrial fibrillation rate 80-90- Personally Reviewed  ECG  - Personally Reviewed  Physical Exam  GEN: No acute distress.   Neck: No JVD Cardiac: Irregularly irregular, no murmurs, rubs, or gallops.  Respiratory: Clear to auscultation bilaterally. GI: Soft, nontender, non-distended  MS: No edema; No deformity. Neuro:  Nonfocal  Psych: Normal affect   Labs High Sensitivity Troponin:   Recent Labs  Lab 01/14/24 0043 01/14/24 0452  TROPONINIHS 25* 27*     Chemistry Recent Labs  Lab 01/18/24 0347 01/18/24 1131 01/18/24 1136 01/19/24 0406 01/20/24 0355  NA 137   < > 137 136 135  K 3.6   < > 3.6 4.0 3.5  CL 97*  --   --  101 97*  CO2 32  --   --  24 27  GLUCOSE  87  --   --  97 101*  BUN 21  --   --  15 20  CREATININE 0.93  --   --  0.78 1.15  CALCIUM  9.1  --   --  9.1 9.1  MG 2.1  --   --  2.2 2.0  PROT 6.8  --   --  6.6 6.3*  ALBUMIN 3.8  --   --  3.7 3.7  AST 33  --   --  36 36  ALT 32  --   --  45* 54*  ALKPHOS 49  --   --  50 50  BILITOT 0.9  --   --  1.3* 0.8  GFRNONAA >60  --   --  >60 >60  ANIONGAP 8  --   --  11 11   < > = values in this interval not displayed.    Lipids  Recent Labs  Lab 01/17/24 0611  CHOL 128  TRIG 53  HDL 62  LDLCALC 55  CHOLHDL 2.1    Hematology Recent Labs  Lab 01/18/24 0347 01/18/24 1131 01/18/24 1136 01/19/24 0406 01/20/24 0355  WBC 6.6  --   --  6.1 10.0  RBC 4.61  --   --  4.79 4.50  HGB 14.2   < > 13.9 14.3 13.7  HCT 42.0   < > 41.0 44.1 40.8  MCV 91.1  --   --  92.1 90.7  MCH 30.8  --   --  29.9 30.4  MCHC 33.8  --   --  32.4 33.6  RDW 13.7  --   --  13.7 13.9  PLT 141*  --   --   172 167   < > = values in this interval not displayed.   Thyroid No results for input(s): TSH, FREET4 in the last 168 hours.  BNP Recent Labs  Lab 01/14/24 0454  BNP 401.0*    DDimer  Recent Labs  Lab 01/14/24 0454  DDIMER 1.19*     Radiology  ECHO TEE Result Date: 01/19/2024    TRANSESOPHOGEAL ECHO REPORT   Patient Name:   Terry Garcia Date of Exam: 01/19/2024 Medical Rec #:  [id number]      Height:       70.0 in Accession #:    [id number]     Weight:       188.5 lb Date of Birth:  July 25, 1945       BSA:          2.035 m Patient Age:    78 years       BP:           148/92 mmHg Patient Gender: M              HR:           85 bpm. Exam Location:  [facility] Procedure: Transesophageal Echo, Cardiac Doppler, Color Doppler and Saline            Contrast Bubble Study (Both Spectral and Color Flow Doppler were            utilized during procedure). Indications:     Atrial Fibrillation I48.91  History:         Patient has prior history of Echocardiogram examinations, most                  recent 01/16/2024. COPD; Risk Factors:Hypertension.  Sonographer:     Christopher Furnace Referring Phys:  [id number] [facility]  CAREY Diagnosing Phys: Megan Hayduk MD PROCEDURE: After discussion of the risks and benefits of a TEE, an informed consent was obtained from the patient. TEE procedure time was 30 minutes. The transesophogeal probe was passed without difficulty through the esophogus of the patient. Local oropharyngeal anesthetic was provided with viscous lidocaine  and Cetacaine . Sedation performed by different physician. Image quality was excellent. The patient's vital signs; including heart rate, blood pressure, and oxygen saturation; remained stable throughout the procedure. The patient developed no complications during the procedure. An unsuccessful direct current cardioversion was performed at 200 joules with 3 attempts.  IMPRESSIONS  1. Left ventricular ejection fraction, by estimation, is 35 to 40%. The left ventricle  has moderately decreased function. The left ventricle demonstrates global hypokinesis.  2. Right ventricular systolic function is mildly reduced. The right ventricular size is normal.  3. Left atrial size was moderately dilated. No left atrial/left atrial appendage thrombus was detected.  4. The mitral valve is normal in structure. Mild mitral valve regurgitation. No evidence of mitral stenosis.  5. The aortic valve is tricuspid. Aortic valve regurgitation is not visualized. No aortic stenosis is present.  6. There is mild (Grade II) atheroma plaque involving the aortic arch and descending aorta.  7. The inferior vena cava is normal in size with greater than 50% respiratory variability, suggesting right atrial pressure of 3 mmHg.  8. Agitated saline contrast bubble study was negative, with no evidence of any interatrial shunt.  9. Conclusion(s)/Recommendation(s): Normal biventricular function without evidence of hemodynamically significant valvular heart disease. FINDINGS  Left Ventricle: Left ventricular ejection fraction, by estimation, is 35 to 40%. The left ventricle has moderately decreased function. The left ventricle demonstrates global hypokinesis. The left ventricular internal cavity size was normal in size. There is no left ventricular hypertrophy. Right Ventricle: The right ventricular size is normal. No increase in right ventricular wall thickness. Right ventricular systolic function is mildly reduced. Left Atrium: Left atrial size was moderately dilated. No left atrial/left atrial appendage thrombus was detected. Right Atrium: Right atrial size was normal in size. Pericardium: There is no evidence of pericardial effusion. Mitral Valve: The mitral valve is normal in structure. Mild mitral valve regurgitation. No evidence of mitral valve stenosis. Tricuspid Valve: The tricuspid valve is normal in structure. Tricuspid valve regurgitation is mild . No evidence of tricuspid stenosis. Aortic Valve: The aortic  valve is tricuspid. Aortic valve regurgitation is not visualized. No aortic stenosis is present. Pulmonic Valve: The pulmonic valve was normal in structure. Pulmonic valve regurgitation is not visualized. No evidence of pulmonic stenosis. Aorta: The aortic root is normal in size and structure. There is mild (Grade II) atheroma plaque involving the aortic arch and descending aorta. Venous: The inferior vena cava is normal in size with greater than 50% respiratory variability, suggesting right atrial pressure of 3 mmHg. IAS/Shunts: No atrial level shunt detected by color flow Doppler. Agitated saline contrast was given intravenously to evaluate for intracardiac shunting. Agitated saline contrast bubble study was negative, with no evidence of any interatrial shunt. There  is no evidence of a patent foramen ovale. There is no evidence of an atrial septal defect. Additional Comments: 3D was performed not requiring image post processing on an independent workstation and was indeterminate. Evalene Lunger MD Electronically signed by Evalene Lunger MD Signature Date/Time: 01/19/2024/2:57:47 PM    Final      Patient Profile   78 y.o. male with recently diagnosed HFrEF, COPD, tobacco abuse, hypertension, and alcohol abuse, admitted with  acute HFrEF and incidentally noted to be in atrial fibrillation with rapid ventricular response.   Assessment & Plan  Acute HFrEF: Echocardiogram with ejection fraction 35 to 40% in the setting of atrial fibrillation with RVR Cardiac catheterization performed this admission with multivessel coronary disease, medical management recommended given no significant angina - On Eliquis  in place of aspirin , continue Lipitor 10 daily   Persistent atrial fibrillation: -TEE cardioversion yesterday, unable to restore normal sinus rhythm -Started on amiodarone  infusion overnight,  will transition to oral amiodarone  load 400 twice daily 1 week then down to 200 twice daily -Initiate metoprolol   succinate 100 daily, Eliquis  5 twice daily -Repeat cardioversion in 3 weeks time after amiodarone  load on metoprolol    Coronary artery disease: Catheterization 2 days ago detailing heavily calcified coronary arteries with moderate moderate to severe disease in multiple territories, not particularly well-suited to intervention.  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

## 2024-01-20 NOTE — Plan of Care (Signed)
  Problem: Clinical Measurements: Goal: Ability to maintain clinical measurements within normal limits will improve Outcome: Progressing   Problem: Clinical Measurements: Goal: Cardiovascular complication will be avoided Outcome: Progressing   Problem: Activity: Goal: Risk for activity intolerance will decrease Outcome: Progressing   Problem: Pain Managment: Goal: General experience of comfort will improve and/or be controlled Outcome: Progressing   Problem: Safety: Goal: Ability to remain free from injury will improve Outcome: Progressing

## 2024-01-20 NOTE — Progress Notes (Signed)
 Occupational Therapy Reevaluation Patient Details Name: Terry Garcia MRN: [id number] DOB: 20-Dec-1945 Today's Date: 01/20/2024   History of present illness Pt admitted to [facility] on 01/14/24 under observation for c/o mechanical fall resulting in laceration to L side of head. Also endorses progressive DOE and difficulty lying flat. Admitted for further management of hypotension and CHF exacerbation; cardiac catheterization performed 01/19/24. Imaging negative for acute abnormality. Significant PMH includes:  COPD, tobacco abuse, self-reported CHF on Lasix  (no echo on file), EtOH abuse.   OT comments  Mr Sprung seen for OT re-eval on this date. Upon arrival to room pt seated EOB, agreeable to tx. Pt requires SUPERVISION for toileting and seated LB dressing. Pt ambulated ~400 ft w/ RW and SUPERVISION; no LOB noted and pt denies dizziness.   SaO2 on room air at rest = 92% SaO2 on room air while ambulating = 88% SaO2 on 1 liters of O2 while ambulating = 92%    Pt educated on pursed lip breathing. Pt at baseline, no skilled OT needs identified, will sign off. Do not anticipate any OT needs upon hospital d/c.      If plan is discharge home, recommend the following:  Assist for transportation   Equipment Recommendations  BSC/3in1    Recommendations for Other Services      Precautions / Restrictions Precautions Precautions: Fall Recall of Precautions/Restrictions: Intact Restrictions Weight Bearing Restrictions Per Provider Order: No       Mobility Bed Mobility               General bed mobility comments: pt seated EOB on arrival    Transfers Overall transfer level: Needs assistance Equipment used: Rolling walker (2 wheels) Transfers: Sit to/from Stand Sit to Stand: Supervision                 Balance Overall balance assessment: Needs assistance Sitting-balance support: No upper extremity supported, Feet supported Sitting balance-Leahy Scale: Normal     Standing  balance support: No upper extremity supported, During functional activity Standing balance-Leahy Scale: Good                             ADL either performed or assessed with clinical judgement   ADL Overall ADL's : Needs assistance/impaired                                       General ADL Comments: SUPERVISION for toileting and seated LB dressing    Extremity/Trunk Assessment Upper Extremity Assessment Upper Extremity Assessment: Overall WFL for tasks assessed   Lower Extremity Assessment Lower Extremity Assessment: Overall WFL for tasks assessed        Vision       Perception     Praxis     Communication Communication Communication: No apparent difficulties   Cognition Arousal: Alert Behavior During Therapy: WFL for tasks assessed/performed Cognition: No apparent impairments                               Following commands: Intact        Cueing   Cueing Techniques: Verbal cues, Gestural cues  Exercises      Shoulder Instructions       General Comments SpO2 88% on RA w/ activity; 92% on 1L    Pertinent Vitals/ Pain  Pain Assessment Pain Assessment: No/denies pain  Home Living Family/patient expects to be discharged to:: Private residence Living Arrangements: Spouse/significant other Available Help at Discharge: Family;Available 24 hours/day Type of Home: House Home Access: Level entry     Home Layout: One level     Bathroom Shower/Tub: Chief Strategy Officer: Standard     Home Equipment: Grab bars - tub/shower;Grab bars - toilet;Cane - single point          Prior Functioning/Environment              Frequency           Progress Toward Goals  OT Goals(current goals can now be found in the care plan section)     Acute Rehab OT Goals Patient Stated Goal: to go home OT Goal Formulation: With patient Time For Goal Achievement: 01/20/24 Potential to Achieve Goals:  Good  Plan      Co-evaluation                 AM-PAC OT 6 Clicks Daily Activity     Outcome Measure   Help from another person eating meals?: None Help from another person taking care of personal grooming?: None Help from another person toileting, which includes using toliet, bedpan, or urinal?: None Help from another person bathing (including washing, rinsing, drying)?: A Little Help from another person to put on and taking off regular upper body clothing?: None Help from another person to put on and taking off regular lower body clothing?: None 6 Click Score: 23    End of Session Equipment Utilized During Treatment: Gait belt;Rolling walker (2 wheels);Oxygen  OT Visit Diagnosis: Unsteadiness on feet (R26.81);Other abnormalities of gait and mobility (R26.89)   Activity Tolerance Patient tolerated treatment well   Patient Left in chair;with call bell/phone within reach;with chair alarm set   Nurse Communication          Time: [phone] OT Time Calculation (min): 35 min  Charges: OT General Charges $OT Visit: 1 Visit OT Evaluation $OT Re-eval: 1 Re-eval OT Treatments $Self Care/Home Management : 23-37 mins  [place], Student OT   [employer] 01/20/2024, 1:05 PM

## 2024-01-20 NOTE — Discharge Summary (Signed)
 Physician Discharge Summary   Patient: Terry Garcia MRN: [id number] DOB: 1945-07-17  Admit date:     01/14/2024  Discharge date: 01/20/24  Discharge Physician: Amaryllis Dare   PCP: Loreli Kins, MD   Recommendations at discharge:  Please obtain CBC and BMP and follow-up Follow-up with cardiology  Discharge Diagnoses: Principal Problem:   CHF exacerbation [facility]) Active Problems:   Alcohol withdrawal (HCC)   Acute HFrEF (heart failure with reduced ejection fraction) (HCC)   Alcohol use   Atrial fibrillation with RVR (HCC)   Dilated cardiomyopathy (HCC)   Coronary artery disease of native artery of native heart with stable angina pectoris (HCC)   Acute on chronic combined systolic and diastolic CHF (congestive heart failure) (HCC)   Persistent atrial fibrillation (HCC)   Exertional dyspnea   Fall   Skin tear of forearm without complication, left, initial encounter   Tachycardia   Head trauma   Hospital Course: Terry Garcia is a 78 y.o. male with COPD, tobacco abuse, self-reported CHF on Lasix  (no echo on file), EtOH abuse, who presents to the ED after sustaining a fall.  Patient reports that he was sleeping at his dining room table which is common for him, but he fell off of his chair and onto his left side.  He sustained a skin abrasion to the left elbow and left side of his head.  He did not lose consciousness.  He was able to get up with the help of his family and ambulated immediately after. In the ED he underwent scalp laceration repair with 1 staple. While being worked up he complained of progressive shortness of breath over the last many weeks and was noted to have some peripheral edema.  He was found to be persistently tachycardic in the 110s to 120s.  He was also found to be tremulous. Labs are mostly unremarkable.  BNP elevation of 401.  Mildly elevated D-dimer.  CTA without PE. We are requested for admission for CHF exacerbation.  On my evaluation patient reports that  he is only slightly more edematous than his baseline.  He reports he has a cardiologist outpatient and takes daily Lasix .  When discussing his alcohol intake he endorses 3 shots of right liquor daily.  His last drink was 7/10.  Echocardiogram with a EF of 35 to 40%, global hypokinesis and grade 1 diastolic dysfunction.  Patient was initially diuresed with IV Lasix .  Had left heart catheterization on 7/15 with shows heavily calcified coronary arteries with moderate to severe disease in multiple territories, not suitable for any intervention.  Patient denies any angina.  Cardiology is recommending medical management.  Patient was started of SGLT2 and spironolactone .  Further medication during outpatient follow-up.  Patient was also found to have persistent atrial fibrillation, failed DCCV even after multiple attempts.CHA2DS2-VASc of 5 . Currently rate controlled on amiodarone  and metoprolol .  Patient was started on loading dose of amiodarone  for 1 week and he will have a close follow-up with outpatient cardiology, they are planning to retry cardioversion in 3 weeks.  Patient initially had acute hypoxic respiratory failure secondary to acute on chronic heart failure and COPD exacerbation.  Resolved and patient is now on room air.  He was treated with IV diuresis, steroid and breathing treatments.  He will follow-up with his primary care provider for further assistance.  Patient with history of significant alcohol abuse.  Monitored with CIWA protocol, no sign of withdrawal.  Patient was counseled and started on folic acid  and B1  supplement.  He need continuation of counseling for alcohol and tobacco abuse.  Patient was found to have a scalp laceration secondary to fall, 1 staple was placed by ED provider, that can be removed as outpatient by primary care provider.  PT and OT evaluated her and recommended home health services which was ordered.  Patient will continue current medications and follow-up  with his providers closely for further assistance.   Consultants: Cardiology Procedures performed: Left heart catheterization.  DCCV Disposition: Home health Diet recommendation:  Discharge Diet Orders (From admission, onward)     Start     Ordered   01/20/24 0000  Diet - low sodium heart healthy        01/20/24 1332           Cardiac diet DISCHARGE MEDICATION: Allergies as of 01/20/2024       Reactions   Budeson-glycopyrrol-formoterol    Other Reaction(s): tongue swelling   Symbicort [budesonide-formoterol Fumarate]    Other Reaction(s): ineffective   Advair Diskus [fluticasone-salmeterol] Other (See Comments)   Pt states gave him the shakes   Levaquin [levofloxacin] Other (See Comments)   Pt states made him feel extreme anger        Medication List     STOP taking these medications    amlodipine -olmesartan  10-20 MG tablet Commonly known as: AZOR        TAKE these medications    albuterol  108 (90 Base) MCG/ACT inhaler Commonly known as: VENTOLIN  HFA Inhale 1-2 puffs into the lungs every 6 (six) hours as needed for wheezing or shortness of breath.   amiodarone  200 MG tablet Commonly known as: PACERONE  Take 2 tablets (400 mg total) by mouth 2 (two) times daily for 7 days, THEN 1 tablet (200 mg total) 2 (two) times daily for 7 days, THEN 1 tablet (200 mg total) daily for 14 days. Start taking on: January 20, 2024   apixaban  5 MG Tabs tablet Commonly known as: ELIQUIS  Take 1 tablet (5 mg total) by mouth 2 (two) times daily.   atorvastatin  10 MG tablet Commonly known as: LIPITOR Take 1 tablet (10 mg total) by mouth daily. Start taking on: January 21, 2024   Bevespi Aerosphere 9-4.8 MCG/ACT Aero Generic drug: Glycopyrrolate-Formoterol Inhale 2 puffs into the lungs at bedtime.   dapagliflozin  propanediol 10 MG Tabs tablet Commonly known as: FARXIGA  Take 1 tablet (10 mg total) by mouth daily.   folic acid  1 MG tablet Commonly known as: FOLVITE  Take 1  tablet (1 mg total) by mouth daily. Start taking on: January 21, 2024   furosemide  40 MG tablet Commonly known as: LASIX  Take 1 tablet (40 mg total) by mouth daily. Start taking on: January 21, 2024 What changed:  medication strength how much to take   losartan  25 MG tablet Commonly known as: COZAAR  Take 1 tablet (25 mg total) by mouth daily.   metoprolol  succinate 100 MG 24 hr tablet Commonly known as: TOPROL -XL Take 1 tablet (100 mg total) by mouth daily. Take with or immediately following a meal.   nicotine  14 mg/24hr patch Commonly known as: NICODERM CQ  - dosed in mg/24 hours Place 1 patch (14 mg total) onto the skin daily.   nicotine  polacrilex 4 MG lozenge Commonly known as: Nicotine  Mini Take 1 lozenge (4 mg total) by mouth as needed.   spironolactone  25 MG tablet Commonly known as: ALDACTONE  Take 0.5 tablets (12.5 mg total) by mouth daily. Start taking on: January 21, 2024   thiamine  100 MG tablet  Commonly known as: Vitamin B-1 Take 1 tablet (100 mg total) by mouth daily. Start taking on: January 21, 2024        Follow-up Information     [facility]. Go on 01/24/2024.   Specialty: Cardiology Why: Hospital Follow-UP 01/24/24 @ 3:00 PM Please bring all medications to follow-up appointment [facility], [address], Second Floor Free Valet parking at the door Contact information: [address] [place]  [address] [phone]        Loreli Kins, MD. Schedule an appointment as soon as possible for a visit in 1 week(s).   Specialty: Family Medicine Contact information: [address] [place] [address] [phone]         Darron Deatrice LABOR, MD. Schedule an appointment as soon as possible for a visit in 1 week(s).   Specialty: Cardiology Contact information: [address] [place] [address] [phone]                Discharge  Exam: Terry Garcia   01/17/24 1300 01/18/24 0500 01/18/24 0946  Weight: 84.7 kg 85.5 kg 85.5 kg   General.   Frail elderly man, in no acute distress. Pulmonary.  Lungs clear bilaterally, normal respiratory effort. CV.  Irregularly irregular Abdomen.  Soft, nontender, nondistended, BS positive. CNS.  Alert and oriented .  No focal neurologic deficit. Extremities.  No edema, no cyanosis, pulses intact and symmetrical. Psychiatry.  Judgment and insight appears normal.   Condition at discharge: stable  The results of significant diagnostics from this hospitalization (including imaging, microbiology, ancillary and laboratory) are listed below for reference.   Imaging Studies: ECHO TEE Result Date: 01/19/2024    TRANSESOPHOGEAL ECHO REPORT   Patient Name:   Terry Garcia Date of Exam: 01/19/2024 Medical Rec #:  [id number]      Height:       70.0 in Accession #:    [id number]     Weight:       188.5 lb Date of Birth:  1946-01-05       BSA:          2.035 m Patient Age:    78 years       BP:           148/92 mmHg Patient Gender: M              HR:           85 bpm. Exam Location:  [facility] Procedure: Transesophageal Echo, Cardiac Doppler, Color Doppler and Saline            Contrast Bubble Study (Both Spectral and Color Flow Doppler were            utilized during procedure). Indications:     Atrial Fibrillation I48.91  History:         Patient has prior history of Echocardiogram examinations, most                  recent 01/16/2024. COPD; Risk Factors:Hypertension.  Sonographer:     Christopher Furnace Referring Phys:  [id number] [facility] L CAREY Diagnosing Phys: Evalene Lunger MD PROCEDURE: After discussion of the risks and benefits of a TEE, an informed consent was obtained from the patient. TEE procedure time was 30 minutes. The transesophogeal probe was passed without difficulty through the esophogus of the patient. Local oropharyngeal anesthetic was provided with viscous lidocaine  and Cetacaine . Sedation performed by  different physician. Image quality was excellent. The patient's vital signs; including heart rate, blood pressure, and oxygen saturation; remained stable throughout the procedure. The patient developed no complications during the procedure. An unsuccessful direct current cardioversion was performed at 200 joules with 3 attempts.  IMPRESSIONS  1. Left ventricular ejection fraction, by estimation, is 35 to 40%. The left ventricle has moderately decreased function. The left ventricle demonstrates global hypokinesis.  2. Right ventricular systolic function is mildly reduced. The right ventricular size is normal.  3. Left atrial size was moderately dilated. No left atrial/left atrial appendage thrombus was detected.  4. The mitral valve is normal in structure. Mild mitral valve regurgitation. No evidence of mitral stenosis.  5. The aortic valve is tricuspid. Aortic valve regurgitation is not visualized. No aortic stenosis is present.  6. There is mild (Grade II) atheroma plaque involving the aortic arch and descending aorta.  7. The inferior vena cava is normal in size with greater than 50% respiratory variability, suggesting right atrial pressure of 3 mmHg.  8. Agitated saline contrast bubble study was negative, with no evidence of any interatrial shunt.  9. Conclusion(s)/Recommendation(s): Normal biventricular function without evidence of hemodynamically significant valvular heart disease. FINDINGS  Left Ventricle: Left ventricular ejection fraction, by estimation, is 35 to 40%. The left ventricle has moderately decreased function. The left ventricle demonstrates global hypokinesis. The left ventricular internal cavity size was normal in size. There is no left ventricular hypertrophy. Right Ventricle: The right ventricular size is normal. No increase in right ventricular wall thickness. Right ventricular systolic function is mildly reduced. Left Atrium: Left atrial size was moderately dilated. No left atrial/left  atrial appendage thrombus was detected. Right Atrium: Right atrial size was normal in size. Pericardium: There is no evidence of pericardial effusion. Mitral Valve: The mitral valve is normal in structure. Mild mitral valve regurgitation. No evidence of mitral valve stenosis. Tricuspid Valve: The tricuspid valve is normal in structure. Tricuspid valve regurgitation is mild . No evidence of tricuspid stenosis. Aortic Valve: The aortic valve is tricuspid. Aortic valve regurgitation is not visualized. No aortic stenosis is present. Pulmonic Valve: The pulmonic valve was normal in structure. Pulmonic valve regurgitation is not visualized. No evidence of pulmonic stenosis. Aorta: The aortic root is normal in size and structure. There is mild (Grade II) atheroma plaque involving the aortic arch and descending aorta. Venous: The inferior vena cava is normal in size with greater than 50% respiratory variability, suggesting right atrial pressure of 3 mmHg. IAS/Shunts: No atrial level shunt detected by color flow Doppler. Agitated saline contrast was given intravenously to evaluate for intracardiac shunting. Agitated saline contrast bubble study was negative, with no evidence of any interatrial shunt. There  is no evidence of a patent foramen ovale. There is no evidence of an atrial septal defect. Additional Comments: 3D was performed not requiring image post processing on an independent workstation and was indeterminate. Timothy Gollan MD Electronically signed by Evalene Lunger MD Signature Date/Time: 01/19/2024/2:57:47 PM    Final    CARDIAC CATHETERIZATION Result Date: 01/18/2024 Conclusions: Multivessel coronary artery disease with heavy calcification, as detailed below, including 70% mid LAD stenosis, chronically occluded first septal branch, sequential 50% & 90% LCx lesions, and tandem 50% RCA stenoses.  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 Mildly elevated left heart filling pressures.  Mildly-moderately elevated right heart filling pressures. Normal Fick cardiac output/index. Recommendations: Resume heparin  infusion 2 hours after TR band has been removed; transition to DOAC as soon as  tomorrow if there is no evidence of bleeding/vascular injury. Favor medical therapy of multivessel CAD given lack of angina. [name], MD [facility]  ECHOCARDIOGRAM COMPLETE Result Date: 01/16/2024    ECHOCARDIOGRAM REPORT   Patient Name:   Terry Garcia Date of Exam: 01/16/2024 Medical Rec #:  [id number]      Height:       70.5 in Accession #:    [id number]     Weight:       201.1 lb Date of Birth:  11-09-1945       BSA:          2.103 m Patient Age:    78 years       BP:           125/87 mmHg Patient Gender: M              HR:           119 bpm. Exam Location:  [facility] Procedure: 2D Echo, Cardiac Doppler and Color Doppler (Both Spectral and Color            Flow Doppler were utilized during procedure). Indications:     CHF-Acute Diastolic I50.31  History:         Patient has no prior history of Echocardiogram examinations.                  Arrythmias:RBBB; Risk Factors:Hypertension.  Sonographer:     Bari Roar Referring Phys:  [id number] ALEXANDRA DEZII Diagnosing Phys: Cara JONETTA Lovelace MD IMPRESSIONS  1. Left ventricular ejection fraction, by estimation, is 35 to 40%. The left ventricle has moderately decreased function. The left ventricle demonstrates global hypokinesis. Left ventricular diastolic parameters are consistent with Grade I diastolic dysfunction (impaired relaxation).  2. Right ventricular systolic function is mildly reduced. The right ventricular size is mildly enlarged. Mildly increased right ventricular wall thickness.  3. Left atrial size was mildly dilated.  4. Right atrial size was moderately dilated.  5. The mitral valve is normal in structure. No evidence of mitral valve regurgitation.  6. The aortic valve is normal in structure. Aortic valve regurgitation is not visualized. FINDINGS   Left Ventricle: Left ventricular ejection fraction, by estimation, is 35 to 40%. The left ventricle has moderately decreased function. The left ventricle demonstrates global hypokinesis. Strain was performed and the global longitudinal strain is indeterminate. The left ventricular internal cavity size was normal in size. There is borderline left ventricular hypertrophy. Left ventricular diastolic parameters are consistent with Grade I diastolic dysfunction (impaired relaxation). Right Ventricle: The right ventricular size is mildly enlarged. Mildly increased right ventricular wall thickness. Right ventricular systolic function is mildly reduced. Left Atrium: Left atrial size was mildly dilated. Right Atrium: Right atrial size was moderately dilated. Pericardium: There is no evidence of pericardial effusion. Mitral Valve: The mitral valve is normal in structure. No evidence of mitral valve regurgitation. Tricuspid Valve: The tricuspid valve is normal in structure. Tricuspid valve regurgitation is mild. Aortic Valve: The aortic valve is normal in structure. Aortic valve regurgitation is not visualized. Aortic valve mean gradient measures 4.0 mmHg. Aortic valve peak gradient measures 8.3 mmHg. Aortic valve area, by VTI measures 2.18 cm. Pulmonic Valve: The pulmonic valve was grossly normal. Pulmonic valve regurgitation is not visualized. Aorta: The ascending aorta was not well visualized. IAS/Shunts: No atrial level shunt detected by color flow Doppler. Additional Comments: 3D was performed not requiring image post processing on an independent workstation and was indeterminate.  LEFT  VENTRICLE PLAX 2D LVIDd:         5.00 cm     Diastology LVIDs:         4.05 cm     LV e' medial:    10.80 cm/s LV PW:         1.00 cm     LV E/e' medial:  10.7 LV IVS:        1.10 cm     LV e' lateral:   13.60 cm/s LVOT diam:     2.00 cm     LV E/e' lateral: 8.5 LV SV:         43 LV SV Index:   20 LVOT Area:     3.14 cm  LV Volumes  (MOD) LV vol d, MOD A2C: 91.1 ml LV vol d, MOD A4C: 91.6 ml LV vol s, MOD A2C: 53.2 ml LV vol s, MOD A4C: 58.2 ml LV SV MOD A2C:     37.9 ml LV SV MOD A4C:     91.6 ml LV SV MOD BP:      36.5 ml RIGHT VENTRICLE RV Basal diam:  3.10 cm RV Mid diam:    2.60 cm RV S prime:     13.30 cm/s TAPSE (M-mode): 1.9 cm LEFT ATRIUM              Index        RIGHT ATRIUM           Index LA diam:        4.50 cm  2.14 cm/m   RA Area:     24.30 cm LA Vol (A2C):   102.0 ml 48.50 ml/m  RA Volume:   74.70 ml  35.52 ml/m LA Vol (A4C):   79.2 ml  37.66 ml/m LA Biplane Vol: 91.8 ml  43.65 ml/m  AORTIC VALVE                    PULMONIC VALVE AV Area (Vmax):    1.88 cm     PV Vmax:        1.35 m/s AV Area (Vmean):   1.97 cm     PV Peak grad:   7.3 mmHg AV Area (VTI):     2.18 cm     RVOT Peak grad: 4 mmHg AV Vmax:           144.00 cm/s AV Vmean:          91.100 cm/s AV VTI:            0.197 m AV Peak Grad:      8.3 mmHg AV Mean Grad:      4.0 mmHg LVOT Vmax:         86.20 cm/s LVOT Vmean:        57.200 cm/s LVOT VTI:          0.137 m LVOT/AV VTI ratio: 0.70  AORTA Ao Root diam: 2.90 cm Ao Asc diam:  3.60 cm MITRAL VALVE                TRICUSPID VALVE MV Area (PHT): 6.12 cm     TR Peak grad:   36.5 mmHg MV Decel Time: 124 msec     TR Vmax:        302.00 cm/s MV E velocity: 116.00 cm/s  SHUNTS                             Systemic VTI:  0.14 m                             Systemic Diam: 2.00 cm Cara JONETTA Lovelace MD Electronically signed by Cara JONETTA Lovelace MD Signature Date/Time: 01/16/2024/6:34:53 PM    Final    CT Angio Chest PE W/Cm &/Or Wo Cm Result Date: 01/14/2024 CLINICAL DATA:  Shortness of breath and positive D-dimer. EXAM: CT ANGIOGRAPHY CHEST WITH CONTRAST TECHNIQUE: Multidetector CT imaging of the chest was performed using the standard protocol during bolus administration of intravenous contrast. Multiplanar CT image reconstructions and MIPs were obtained to evaluate the vascular anatomy.  RADIATION DOSE REDUCTION: This exam was performed according to the departmental dose-optimization program which includes automated exposure control, adjustment of the mA and/or kV according to patient size and/or use of iterative reconstruction technique. CONTRAST:  OMNIPAQUE  IOHEXOL  350 MG/ML SOLN COMPARISON:  Portable chest from earlier today, PA Lat chest 01/21/2021. No prior cross-sectional imaging for comparison. FINDINGS: Cardiovascular: The pulmonary trunk is prominent at 3.2 cm, both main pulmonary arteries slightly greater than 3 cm indicating arterial hypertension. The right heart chambers are slightly larger than the left, consistent with right heart dysfunction likely chronic. Pulmonary arterial opacification is diagnostic. No arterial embolus is seen. There is mild cardiomegaly. No pericardial effusion. Coronary arteries are heavily calcified. There is slightly distended central pulmonary veins. The aorta is tortuous. Patchy calcification noted in the aorta and great vessels. Opacification of the aorta and great vessels is insufficient to assess the lumen but there are no displaced intimal plaques. Mediastinum/Nodes: No enlarged mediastinal, hilar, or axillary lymph nodes. Thyroid gland, trachea, and esophagus demonstrate no significant findings. Lungs/Pleura: There is diffuse bronchial thickening. There are posterior basal segmental bronchial impactions in the right lower lobe and bilateral scattered subsegmental bronchial impactions in both lower lobes There is asymmetric linear atelectasis in the right lower lobe. No focal pneumonia is seen. No findings of interstitial edema. The lungs are mildly emphysematous with centrilobular changes predominating. There are no visible pulmonary nodules. There is a trace right pleural effusion with no left pleural fluid. No pleural thickening or pneumothorax. Upper Abdomen: No acute abnormality. Abdominal aortic atherosclerosis. Capsular nodularity suspected  over portions of the dorsal liver surface, may be seen with early cirrhosis. There are calcified granulomas in the spleen. Musculoskeletal: There is osteopenia, kyphosis and degenerative change of thoracic spine. No acute or significant osseous findings or chest wall mass. There is an upper plate anterior wedge compression fracture deformity of the T12 vertebral body, with 30% anterior and 10% posterior height loss without retropulsion or acute appearance. Review of the MIP images confirms the above findings. IMPRESSION: 1. No evidence of arterial embolus. 2. Prominent pulmonary trunk and main pulmonary arteries indicating arterial hypertension. 3. Mild cardiomegaly with slightly distended central pulmonary veins and asymmetric prominence of the right chambers. 4. Aortic and coronary artery atherosclerosis. 5. COPD. 6. Diffuse bronchial thickening with posterior basal segmental bronchial impactions in the right lower lobe and bilateral scattered subsegmental bronchial impactions in both lower lobes. 7. Trace right pleural effusion.  No focal infiltrate. 8. Osteopenia, kyphosis and degenerative change of the thoracic spine. 9. Chronic appearing T12 compression fracture. 10. Capsular nodularity suspected over portions of the dorsal liver surface, may be  seen with early cirrhosis. Aortic Atherosclerosis (ICD10-I70.0) and Emphysema (ICD10-J43.9). Electronically Signed   By: Francis Quam M.D.   On: 01/14/2024 07:12   CT Head Wo Contrast Result Date: 01/14/2024 CLINICAL DATA:  Fall EXAM: CT HEAD WITHOUT CONTRAST TECHNIQUE: Contiguous axial images were obtained from the base of the skull through the vertex without intravenous contrast. RADIATION DOSE REDUCTION: This exam was performed according to the departmental dose-optimization program which includes automated exposure control, adjustment of the mA and/or kV according to patient size and/or use of iterative reconstruction technique. COMPARISON:  None Available.  FINDINGS: Brain: There is atrophy and chronic small vessel disease changes. No acute intracranial abnormality. Specifically, no hemorrhage, hydrocephalus, mass lesion, acute infarction, or significant intracranial injury. Vascular: No hyperdense vessel or unexpected calcification. Skull: No acute calvarial abnormality. Sinuses/Orbits: No acute findings Other: Soft tissue laceration in the left lateral scalp. IMPRESSION: Atrophy, chronic microvascular disease. No acute intracranial abnormality. Electronically Signed   By: Franky Crease M.D.   On: 01/14/2024 02:02   DG Chest Portable 1 View Result Date: 01/14/2024 CLINICAL DATA:  Shortness of breath EXAM: PORTABLE CHEST 1 VIEW COMPARISON:  01/21/2021 FINDINGS: There is hyperinflation of the lungs compatible with COPD. Heart borderline in size. No confluent opacities or effusions. No acute bony abnormality. IMPRESSION: COPD. Borderline cardiomegaly. No active disease. Electronically Signed   By: Franky Crease M.D.   On: 01/14/2024 01:03    Microbiology: Results for orders placed or performed during the hospital encounter of 01/14/24  Resp panel by RT-PCR (RSV, Flu A&B, Covid) Anterior Nasal Swab     Status: None   Collection Time: 01/14/24 12:43 AM   Specimen: Anterior Nasal Swab  Result Value Ref Range Status   SARS Coronavirus 2 by RT PCR NEGATIVE NEGATIVE Final    Comment: (NOTE) SARS-CoV-2 target nucleic acids are NOT DETECTED.  The SARS-CoV-2 RNA is generally detectable in upper respiratory specimens during the acute phase of infection. The lowest concentration of SARS-CoV-2 viral copies this assay can detect is 138 copies/mL. A negative result does not preclude SARS-Cov-2 infection and should not be used as the sole basis for treatment or other patient management decisions. A negative result may occur with  improper specimen collection/handling, submission of specimen other than nasopharyngeal swab, presence of viral mutation(s) within  the areas targeted by this assay, and inadequate number of viral copies(<138 copies/mL). A negative result must be combined with clinical observations, patient history, and epidemiological information. The expected result is Negative.  Fact Sheet for Patients:  BloggerCourse.com  Fact Sheet for Healthcare Providers:  SeriousBroker.it  This test is no t yet approved or cleared by the [place]  FDA and  has been authorized for detection and/or diagnosis of SARS-CoV-2 by FDA under an Emergency Use Authorization (EUA). This EUA will remain  in effect (meaning this test can be used) for the duration of the COVID-19 declaration under Section 564(b)(1) of the Act, 21 U.S.C.section 360bbb-3(b)(1), unless the authorization is terminated  or revoked sooner.       Influenza A by PCR NEGATIVE NEGATIVE Final   Influenza B by PCR NEGATIVE NEGATIVE Final    Comment: (NOTE) The Xpert Xpress SARS-CoV-2/FLU/RSV plus assay is intended as an aid in the diagnosis of influenza from Nasopharyngeal swab specimens and should not be used as a sole basis for treatment. Nasal washings and aspirates are unacceptable for Xpert Xpress SARS-CoV-2/FLU/RSV testing.  Fact Sheet for Patients: BloggerCourse.com  Fact Sheet for Healthcare Providers: SeriousBroker.it  This test  is not yet approved or cleared by the [place]  FDA and has been authorized for detection and/or diagnosis of SARS-CoV-2 by FDA under an Emergency Use Authorization (EUA). This EUA will remain in effect (meaning this test can be used) for the duration of the COVID-19 declaration under Section 564(b)(1) of the Act, 21 U.S.C. section 360bbb-3(b)(1), unless the authorization is terminated or revoked.     Resp Syncytial Virus by PCR NEGATIVE NEGATIVE Final    Comment: (NOTE) Fact Sheet for  Patients: BloggerCourse.com  Fact Sheet for Healthcare Providers: SeriousBroker.it  This test is not yet approved or cleared by the [place]  FDA and has been authorized for detection and/or diagnosis of SARS-CoV-2 by FDA under an Emergency Use Authorization (EUA). This EUA will remain in effect (meaning this test can be used) for the duration of the COVID-19 declaration under Section 564(b)(1) of the Act, 21 U.S.C. section 360bbb-3(b)(1), unless the authorization is terminated or revoked.  Performed at [facility], [address] [place], [place] [address]   Blood culture (routine x 2)     Status: None   Collection Time: 01/14/24  4:53 AM   Specimen: BLOOD  Result Value Ref Range Status   Specimen Description BLOOD BLOOD LEFT ARM  Final   Special Requests   Final    BOTTLES DRAWN AEROBIC AND ANAEROBIC Blood Culture adequate volume   Culture   Final    NO GROWTH 5 DAYS Performed at [facility], [address] [place], [place] [address]    Report Status 01/19/2024 FINAL  Final  Blood culture (routine x 2)     Status: None   Collection Time: 01/14/24  4:54 AM   Specimen: BLOOD  Result Value Ref Range Status   Specimen Description BLOOD BLOOD RIGHT ARM  Final   Special Requests   Final    BOTTLES DRAWN AEROBIC AND ANAEROBIC Blood Culture adequate volume   Culture   Final    NO GROWTH 5 DAYS Performed at [facility], [address] [place], [place] [address]    Report Status 01/19/2024 FINAL  Final    Labs: CBC: Recent Labs  Lab 01/16/24 0302 01/17/24 0611 01/18/24 0347 01/18/24 1131 01/18/24 1134 01/18/24 1136 01/19/24 0406 01/20/24 0355  WBC 5.6 6.9 6.6  --   --   --  6.1 10.0  NEUTROABS 3.1 4.9 3.6  --   --   --  3.7 6.7  HGB 13.1 13.5 14.2 13.9 13.9 13.9 14.3 13.7  HCT 39.3 39.7 42.0 41.0 41.0 41.0 44.1 40.8  MCV 91.2 89.8 91.1  --   --   --  92.1 90.7  PLT 151 145*  141*  --   --   --  172 167   Basic Metabolic Panel: Recent Labs  Lab 01/16/24 0302 01/17/24 0611 01/18/24 0347 01/18/24 1131 01/18/24 1134 01/18/24 1136 01/19/24 0406 01/20/24 0355  NA 136 134* 137 136 136 137 136 135  K 3.1* 3.7 3.6 3.5 3.6 3.6 4.0 3.5  CL 98 97* 97*  --   --   --  101 97*  CO2 29 29 32  --   --   --  24 27  GLUCOSE 104* 116* 87  --   --   --  97 101*  BUN 11 17 21   --   --   --  15 20  CREATININE 0.88 0.92 0.93  --   --   --  0.78 1.15  CALCIUM   8.6* 9.1 9.1  --   --   --  9.1 9.1  MG 2.0 2.0 2.1  --   --   --  2.2 2.0  PHOS 3.6 2.9 4.0  --   --   --  3.3 3.8   Liver Function Tests: Recent Labs  Lab 01/16/24 0302 01/17/24 0611 01/18/24 0347 01/19/24 0406 01/20/24 0355  AST 16 17 33 36 36  ALT 15 16 32 45* 54*  ALKPHOS 51 49 49 50 50  BILITOT 0.9 1.0 0.9 1.3* 0.8  PROT 6.4* 6.7 6.8 6.6 6.3*  ALBUMIN 3.4* 3.6 3.8 3.7 3.7   CBG: No results for input(s): GLUCAP in the last 168 hours.  Discharge time spent: greater than 30 minutes.  This record has been created using Conservation officer, historic buildings. Errors have been sought and corrected,but may not always be located. Such creation errors do not reflect on the standard of care.   Signed: Amaryllis Dare, MD [facility] 01/20/2024

## 2024-01-20 NOTE — Progress Notes (Signed)
 Heart Failure Stewardship Pharmacy Note  PCP: Loreli Kins, MD PCP-Cardiologist: Deatrice Cage, MD  HPI: Terry Garcia is a 78 y.o. male with COPD, tobacco abuse, CHF on Lasix , EtOH abuse who presented after a fall in his home. Patient also reported gradual worsening of shortness of breath and lower extremity edema over the past several weeks. On admission, BNP was 401, HS-troponin was 25, and lactic acid was 1.4. Chest x-ray noted no active disease. CT head noted no acute abnormality. CTA noted negative for PE and positive for COPD and trace right pleural effusion. Echocardiogram this admission showed LVEF of 35-40%, grade I diastolic dysfunction and mildly reduced RV function. LHC 01/18/24 with 70% mid LAD, 100% S1 filled by RPDA collaterals, 50% mid Cx, 90% distal Cx, mild diffuse disease throughout RCA, 80% RPAV. RHC 01/18/24 showed RA of 10, PCWP of 24, CO 7.5, CI 3.6. TEE guided DCCV attempted unsuccessfully on 01/20/24.   Pertinent Lab Values: Creatinine, Ser  Date Value Ref Range Status  01/20/2024 1.15 0.61 - 1.24 mg/dL Final   BUN  Date Value Ref Range Status  01/20/2024 20 8 - 23 mg/dL Final   Potassium  Date Value Ref Range Status  01/20/2024 3.5 3.5 - 5.1 mmol/L Final   Sodium  Date Value Ref Range Status  01/20/2024 135 135 - 145 mmol/L Final   B Natriuretic Peptide  Date Value Ref Range Status  01/14/2024 401.0 (H) 0.0 - 100.0 pg/mL Final    Comment:    Performed at [facility], [address] [place], [place] [address]   Magnesium  Date Value Ref Range Status  01/20/2024 2.0 1.7 - 2.4 mg/dL Final    Comment:    Performed at [facility], [address] [place], [place] [address]    Vital Signs:  Temp:  [97.4 F (36.3 C)-98.5 F (36.9 C)] 97.4 F (36.3 C) (07/17 1138) Pulse Rate:  [65-92] 67 (07/17 1138) Cardiac Rhythm: Atrial fibrillation (07/17 1054) Resp:  [8-28] 20 (07/17 1138) BP: (101-192)/(60-175) 138/60 (07/17  1138) SpO2:  [88 %-98 %] 93 % (07/17 1138)  Intake/Output Summary (Last 24 hours) at 01/20/2024 1200 Last data filed at 01/20/2024 0900 Gross per 24 hour  Intake 1203.15 ml  Output 675 ml  Net 528.15 ml    Current Heart Failure Medications:  Loop diuretic: furosemide  40 mg daily Beta-Blocker: metoprolol  tartrate 100 mg daily ACEI/ARB/ARNI: losartan  25 mg daily MRA: spironolactone  12.5 mg daily SGLT2i: Farxiga  10 mg daily Other: none  Prior to admission Heart Failure Medications:  Loop diuretic: furosemide  20 mg daily Beta-Blocker: none ACEI/ARB/ARNI: olmesartan  20 mg daily MRA: none SGLT2i: none Other: amlodipine  10 mg daily  Assessment: 1. 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 NYHA class II-III symptoms.  -Symptoms: Shortness of breath is improved. Appetite is fair. LEE has also improved now mild but persistent. Positive for orthopnea. -Volume: Patient diuresed with IV Lasix , held prior to cath, now resumed with evidence of volume overload. JVP remains elevated. RA pressure on cath yesterday was 10 mmHg and PCWP 24. With higher filling pressures, would continue IV diuresis and may benefit from BID dosing. -Hemodynamics: BP has steadily increasing after holding several BP meds. HR 70s. -BB: Currently on metoprolol  tartrate 25 mg q6h. Consider conversion to metoprolol  succinate prior to discharge. Patient may require dose reduction post DCCV. -ACEI/ARB/ARNI: ARB held with hypotension. Losartan  25 mg daily started today. -MRA: Can  consider increasing spironolactone  to 25 mg daily tomorrow vs outpatient. -SGLT2i: Farxiga  10 mg daily started.  Plan: 1) Medication changes recommended at this time: -Agree with interventions today  2) Patient assistance: -Copays for Entresto, Farxiga , and Jardiance are $0  3) Education: -To be completed prior to discharge.  Medication Assistance /  Insurance Benefits Check: Does the patient have prescription insurance?    Type of insurance plan:  Does the patient qualify for medication assistance through manufacturers or grants? No   Outpatient Pharmacy: Prior to admission outpatient pharmacy: [facility]   Is the patient willing to utilize a [facility] pharmacy at discharge?: Yes  Please do not hesitate to reach out with questions or concerns,  Jaun Bash, PharmD, CPP, BCPS, [facility] Heart Failure Pharmacist  Phone - [phone] 01/20/2024 12:00 PM

## 2024-01-20 NOTE — Progress Notes (Signed)
 Physical Therapy Treatment Patient Details Name: TRAVEN DAVIDS MRN: [id number] DOB: [birth year] Today's Date: 01/20/2024   History of Present Illness Pt admitted to [facility] on 01/14/24 under observation for c/o mechanical fall resulting in laceration to L side of head. Also endorses progressive DOE and difficulty lying flat. Admitted for further management of hypotension and CHF exacerbation; cardiac catheterization performed 01/19/24, attempted (unsuccessful) DCCV 7/16. Cleared per care team for continued participation with therapy. Imaging negative for acute abnormality. Significant PMH includes:  COPD, tobacco abuse, self-reported CHF on Lasix  (no echo on file), EtOH abuse.    PT Comments  Patient tolerating session post-procedures (cardiac cath, TEE) without difficulty.  Minimal to no SOB noted with exertional activities, HR stable and with appropriate chronotropic response to activity. Does desat to 85% with prolonged-gait distance; recovering to >90% within 60 seconds of seated rest.  Patient completes all mobility tasks without assist device, no greater than cga level of assist; easily completing household distance gait efforts (400') without overt buckling or LOB.  Patient does subjectively feeling slightly unsteady with gait efforts (attributes to limited mobility since admission).  Initially declines RW, but with encouragement, agreeable to trial next session.     If plan is discharge home, recommend the following: A little help with bathing/dressing/bathroom;Assist for transportation;Assistance with cooking/housework   Can travel by Media planner walker (2 wheels)    Recommendations for Other Services       Precautions / Restrictions Precautions Precautions: Fall Recall of Precautions/Restrictions: Intact Restrictions Weight Bearing Restrictions Per Provider Order: No     Mobility  Bed Mobility                     Transfers Overall transfer level: Needs assistance Equipment used: None Transfers: Sit to/from Stand Sit to Stand: Supervision           General transfer comment: quick to stand, at times, prior to cuing from therapist    Ambulation/Gait Ambulation/Gait assistance: Contact guard assist Gait Distance (Feet): 400 Feet Assistive device: None         General Gait Details: broad BOS, slightly antalgic to R LE; no overt buckling or LOB.  Does endorse feeling slightly off balance due to limited mobility during admission.  No reports of SOB, chest pain/pressure/palpitations; HR stable 70-80s with gait efforts.  Did note desat to 86% on RA, requiring 60 seconds of seated rest and pursed lip breathing for recovery.   Stairs             Wheelchair Mobility     Tilt Bed    Modified Rankin (Stroke Patients Only)       Balance Overall balance assessment: Needs assistance Sitting-balance support: No upper extremity supported       Standing balance support: No upper extremity supported Standing balance-Leahy Scale: Good Standing balance comment: intermittently reaches for furniture for external stabilization; fair/good awareness of limits of stability                            Communication    Cognition Arousal: Alert Behavior During Therapy: WFL for tasks assessed/performed   PT - Cognitive impairments: No apparent impairments                         Following commands: Intact      Cueing    Exercises Other  Exercises Other Exercises: Reviewed role of activity pacing, energy conservation, signs/symptoms of fatigue; patient voiced understanding of all information. Other Exercises: Discussed potential use of RW (initially declining this date) for optimal safety, balance, energy conservation with functional mobility; patient agreeable to trial next session.    General Comments        Pertinent Vitals/Pain Pain Assessment Pain  Assessment: No/denies pain    Home Living                          Prior Function            PT Goals (current goals can now be found in the care plan section) Acute Rehab PT Goals Patient Stated Goal: go home PT Goal Formulation: With patient Time For Goal Achievement: 01/29/24 Potential to Achieve Goals: Good Progress towards PT goals: Progressing toward goals    Frequency    Min 2X/week      PT Plan      Co-evaluation              AM-PAC PT 6 Clicks Mobility   Outcome Measure  Help needed turning from your back to your side while in a flat bed without using bedrails?: None Help needed moving from lying on your back to sitting on the side of a flat bed without using bedrails?: None Help needed moving to and from a bed to a chair (including a wheelchair)?: None Help needed standing up from a chair using your arms (e.g., wheelchair or bedside chair)?: None Help needed to walk in hospital room?: A Little Help needed climbing 3-5 steps with a railing? : A Little 6 Click Score: 22    End of Session   Activity Tolerance: Patient tolerated treatment well Patient left: in bed;with call bell/phone within reach   PT Visit Diagnosis: Unsteadiness on feet (R26.81);Muscle weakness (generalized) (M62.81)     Time: 8898-8880 PT Time Calculation (min) (ACUTE ONLY): 18 min  Charges:    $Gait Training: 8-22 mins PT General Charges $$ ACUTE PT VISIT: 1 Visit                     Toyna Erisman H. Delores, PT, DPT, NCS 01/20/24, 10:23 PM [phone]

## 2024-01-20 NOTE — Progress Notes (Signed)
 PHARMACY - ANTICOAGULATION CONSULT NOTE  Pharmacy Consult for heparin  dosage adjustment  Indication: atrial fibrillation  Allergies  Allergen Reactions   Budeson-Glycopyrrol-Formoterol     Other Reaction(s): tongue swelling   Symbicort [Budesonide-Formoterol Fumarate]     Other Reaction(s): ineffective   Advair Diskus [Fluticasone-Salmeterol] Other (See Comments)    Pt states gave him the shakes   Levaquin [Levofloxacin] Other (See Comments)    Pt states made him feel extreme anger     Patient Measurements: Height: 5' 10 (177.8 cm) Weight: 85.5 kg (188 lb 7.9 oz) IBW/kg (Calculated) : 73 HEPARIN  DW (KG): 85.5  Vital Signs: Temp: 98.4 F (36.9 C) (07/17 0406) Temp Source: Oral (07/17 0406) BP: 146/81 (07/17 0406) Pulse Rate: 71 (07/17 0406)  Labs: Recent Labs    01/18/24 0347 01/18/24 1131 01/18/24 1136 01/19/24 0406 01/19/24 1413 01/19/24 2245 01/20/24 0355 01/20/24 0642  HGB 14.2   < > 13.9 14.3  --   --  13.7  --   HCT 42.0   < > 41.0 44.1  --   --  40.8  --   PLT 141*  --   --  172  --   --  167  --   HEPARINUNFRC  --   --   --  0.23* 0.18* 0.55  --  0.54  CREATININE 0.93  --   --  0.78  --   --  1.15  --    < > = values in this interval not displayed.    Estimated Creatinine Clearance: 56.4 mL/min (by C-G formula based on SCr of 1.15 mg/dL).   Medical History: Past Medical History:  Diagnosis Date   Arterial atherosclerosis    per pt   COPD (chronic obstructive pulmonary disease) (HCC)    controlled with daily inhaler and albuterol  as needed   GERD (gastroesophageal reflux disease)    no meds needed   History of kidney stones    needed open abdominal surgery for stone in 1978, no stones since   Hypertension    amlodipine  and lisinopril/HCTZ   Right bundle branch block    per pt, managed by primary, does not see cardiologist    Medications:  Scheduled:   arformoterol   15 mcg Nebulization BID   And   umeclidinium bromide   1 puff Inhalation  Daily   atorvastatin   10 mg Oral Daily   Chlorhexidine  Gluconate Cloth  6 each Topical Daily   folic acid   1 mg Oral Daily   furosemide   40 mg Intravenous Daily   ipratropium-albuterol   3 mL Nebulization BID   metoprolol  tartrate  25 mg Oral Q6H   multivitamin with minerals  1 tablet Oral Daily   predniSONE   20 mg Oral Q breakfast   sodium chloride  flush  10-40 mL Intracatheter Q12H   sodium chloride  flush  3 mL Intravenous Q12H   spironolactone   12.5 mg Oral Daily   thiamine   100 mg Oral Daily   Or   thiamine   100 mg Intravenous Daily   Infusions:   amiodarone  30 mg/hr (01/20/24 0402)   heparin  1,850 Units/hr (01/20/24 0054)   PRN: ipratropium-albuterol , ondansetron  **OR** ondansetron  (ZOFRAN ) IV, mouth rinse, senna-docusate, sodium chloride  flush  Assessment: 78 year old male with new onset Afib refractory to cardioversion attempting to be pharmacologically converted with amiodarone . Notable PMH HFrEF, HTN, alcohol use, and multivessel CAD. [place] VASC score of 5. CBC stable with hemoglobin of 13.7 g/dL and platelet count of 832 K/uL. Renal function slightly impaired  with Scr 1.15 mg/dL (baseline 0.7 - 0.9) and CrCl 56 mL/min. No notable medication interactions currently present.    Today's heparin  level of 0.54 is therapeutic x 2. Currently managed with heparin  1,850 unit/hr infusion.   Goal of Therapy:  Heparin  level 0.3-0.7 units/ml Monitor platelets by anticoagulation protocol: Yes   Plan:  Continue current regimen of heparin  1,850 unit/hr infusion. Monitor heparin  level tomorrow AM and CBC for platelet count and hemoglobin daily.   Valeree Leidy [place] - PharmD Candidate  01/20/2024,7:36 AM

## 2024-01-20 NOTE — TOC Transition Note (Signed)
 Transition of Care [facility]) - Discharge Note   Patient Details  Name: Terry Garcia MRN: [id number] Date of Birth: 01-16-1946  Transition of Care [facility] CM/SW Contact:  Makael Stein C Obediah Welles, RN Phone Number: 01/20/2024, 4:30 PM   Clinical Narrative:    Spoke with patient regarding HH. Patient advised [facility] was not in network. He was agreeable to [facility].   Channing from [facility] sent and accepted referral.   Patient stated he did not need a RW.   TOC signing off    Barriers to Discharge: Continued Medical Work up   Patient Goals and CMS Choice     Choice offered to / list presented to : Patient      Discharge Placement                       Discharge Plan and Services Additional resources added to the After Visit Summary for     Discharge Planning Services: CM Consult Post Acute Care Choice: Home Health                               Social Drivers of Health (SDOH) Interventions SDOH Screenings   Food Insecurity: No Food Insecurity (01/17/2024)  Housing: Unknown (01/17/2024)  Transportation Needs: No Transportation Needs (01/17/2024)  Utilities: Not At Risk (01/15/2024)  Social Connections: Moderately Isolated (01/15/2024)  Tobacco Use: High Risk (01/19/2024)     Readmission Risk Interventions     No data to display

## 2024-01-20 NOTE — Anesthesia Postprocedure Evaluation (Signed)
 Anesthesia Post Note  Patient: Terry Garcia  Procedure(s) Performed: ECHOCARDIOGRAM, TRANSESOPHAGEAL CARDIOVERSION  Patient location during evaluation: Specials Recovery Anesthesia Type: General Level of consciousness: awake and alert Pain management: pain level controlled Vital Signs Assessment: post-procedure vital signs reviewed and stable Respiratory status: spontaneous breathing, nonlabored ventilation, respiratory function stable and patient connected to nasal cannula oxygen Cardiovascular status: blood pressure returned to baseline and stable Postop Assessment: no apparent nausea or vomiting Anesthetic complications: no   No notable events documented.   Last Vitals:  Vitals:   01/20/24 0406 01/20/24 0740  BP: (!) 146/81 137/77  Pulse: 71 65  Resp: 16 19  Temp: 36.9 C 36.9 C  SpO2: 92% 94%    Last Pain:  Vitals:   01/20/24 0740  TempSrc: Oral  PainSc: 0-No pain                 Prentice Murphy

## 2024-01-21 ENCOUNTER — Telehealth: Payer: Self-pay | Admitting: Family

## 2024-01-21 NOTE — Telephone Encounter (Signed)
 Called to confirm/remind patient of their appointment at [facility] on 01/24/[facility].   Appointment:   [x] Confirmed  [] Left mess   [] No answer/No voice mail  [] VM Full/unable to leave message  [] Phone not in service  Patient reminded to bring all medications and/or complete list.  Confirmed patient has transportation. Gave directions, instructed to utilize valet parking.

## 2024-01-23 NOTE — Progress Notes (Unsigned)
 [facility] Note   Referring Physician: PCP: Patient, No Pcp Per Cardiologist: Deatrice Cage, MD   Chief Complaint:    HPI:  [name] is a 78 y/o male with a history of HTN, COPD, tobacco abuse, CHF, atrial fibrillation, CAD and ETOH abuse.    Admitted 01/14/24 with a fall in his home. Patient also reported gradual worsening of shortness of breath and lower extremity edema over the past several weeks. On admission, BNP was 401, HS-troponin was 25, and lactic acid was 1.4. Chest x-ray noted no active disease. CT head noted no acute abnormality. CTA noted negative for PE and positive for COPD and trace right pleural effusion. Echo 01/16/24: LVEF of 35-40%, grade I diastolic dysfunction and mildly reduced RV function. Initially IV diuresed. LHC 01/18/24 with 70% mid LAD, 100% S1 filled by RPDA collaterals, 50% mid Cx, 90% distal Cx, mild diffuse disease throughout RCA, 80% RPAV. RHC 01/18/24 showed RA of 10, PCWP of 24, CO 7.5, CI 3.6. TEE guided DCCV attempted unsuccessfully on 01/20/24.   He presents today for his initial HF visit with a chief complaint of    Review of Systems: [y] = yes, [ ]  = no   General: Weight gain [ ] ; Weight loss [ ] ; Anorexia [ ] ; Fatigue [ ] ; Fever [ ] ; Chills [ ] ; Weakness [ ]   Cardiac: Chest pain/pressure [ ] ; Resting SOB [ ] ; Exertional SOB [ ] ; Orthopnea [ ] ; Pedal Edema [ ] ; Palpitations [ ] ; Syncope [ ] ; Presyncope [ ] ; Paroxysmal nocturnal dyspnea[ ]   Pulmonary: Cough [ ] ; Wheezing[ ] ; Hemoptysis[ ] ; Sputum [ ] ; Snoring [ ]   GI: Vomiting[ ] ; Dysphagia[ ] ; Melena[ ] ; Hematochezia [ ] ; Heartburn[ ] ; Abdominal pain [ ] ; Constipation [ ] ; Diarrhea [ ] ; BRBPR [ ]   GU: Hematuria[ ] ; Dysuria [ ] ; Nocturia[ ]   Vascular: Pain in legs with walking [ ] ; Pain in feet with lying flat [ ] ; Non-healing sores [ ] ; Stroke [ ] ; TIA [ ] ; Slurred speech [ ] ;  Neuro: Headaches[ ] ; Vertigo[ ] ; Seizures[ ] ; Paresthesias[ ] ;Blurred vision [ ] ; Diplopia [ ] ;  Vision changes [ ]   Ortho/Skin: Arthritis [ ] ; Joint pain [ ] ; Muscle pain [ ] ; Joint swelling [ ] ; Back Pain [ ] ; Rash [ ]   Psych: Depression[ ] ; Anxiety[ ]   Heme: Bleeding problems [ ] ; Clotting disorders [ ] ; Anemia [ ]   Endocrine: Diabetes [ ] ; Thyroid dysfunction[ ]    Past Medical History:  Diagnosis Date   Arterial atherosclerosis    per pt   COPD (chronic obstructive pulmonary disease) (HCC)    controlled with daily inhaler and albuterol  as needed   GERD (gastroesophageal reflux disease)    no meds needed   History of kidney stones    needed open abdominal surgery for stone in 1978, no stones since   Hypertension    amlodipine  and lisinopril/HCTZ   Right bundle branch block    per pt, managed by primary, does not see cardiologist    Current Outpatient Medications  Medication Sig Dispense Refill   albuterol  (PROVENTIL  HFA;VENTOLIN  HFA) 108 (90 Base) MCG/ACT inhaler Inhale 1-2 puffs into the lungs every 6 (six) hours as needed for wheezing or shortness of breath.     amiodarone  (PACERONE ) 200 MG tablet Take 2 tablets (400 mg total) by mouth 2 (two) times daily for 7 days, THEN 1 tablet (200 mg total) 2 (two) times daily for 7 days, THEN 1 tablet (200 mg total) daily for  14 days. 56 tablet 0   apixaban  (ELIQUIS ) 5 MG TABS tablet Take 1 tablet (5 mg total) by mouth 2 (two) times daily. 60 tablet 0   atorvastatin  (LIPITOR) 10 MG tablet Take 1 tablet (10 mg total) by mouth daily. 30 tablet 0   dapagliflozin  propanediol (FARXIGA ) 10 MG TABS tablet Take 1 tablet (10 mg total) by mouth daily. 30 tablet 0   folic acid  (FOLVITE ) 1 MG tablet Take 1 tablet (1 mg total) by mouth daily. 90 tablet 1   furosemide  (LASIX ) 40 MG tablet Take 1 tablet (40 mg total) by mouth daily. 30 tablet 0   Glycopyrrolate-Formoterol (BEVESPI AEROSPHERE) 9-4.8 MCG/ACT AERO Inhale 2 puffs into the lungs at bedtime.     losartan  (COZAAR ) 25 MG tablet Take 1 tablet (25 mg total) by mouth daily. 30 tablet 0    metoprolol  succinate (TOPROL -XL) 100 MG 24 hr tablet Take 1 tablet (100 mg total) by mouth daily. Take with or immediately following a meal. 30 tablet 1   nicotine  (NICODERM CQ  - DOSED IN MG/24 HOURS) 14 mg/24hr patch Place 1 patch (14 mg total) onto the skin daily. 360 patch 0   nicotine  polacrilex (NICOTINE  MINI) 4 MG lozenge Take 1 lozenge (4 mg total) by mouth as needed. 100 tablet 0   spironolactone  (ALDACTONE ) 25 MG tablet Take 0.5 tablets (12.5 mg total) by mouth daily. 15 tablet 0   thiamine  (VITAMIN B-1) 100 MG tablet Take 1 tablet (100 mg total) by mouth daily. 90 tablet 1   No current facility-administered medications for this visit.    Allergies  Allergen Reactions   Budeson-Glycopyrrol-Formoterol     Other Reaction(s): tongue swelling   Symbicort [Budesonide-Formoterol Fumarate]     Other Reaction(s): ineffective   Advair Diskus [Fluticasone-Salmeterol] Other (See Comments)    Pt states gave him the shakes   Levaquin [Levofloxacin] Other (See Comments)    Pt states made him feel extreme anger       Social History   Socioeconomic History   Marital status: Married    Spouse name: Terry Garcia   Number of children: 2   Years of education: Not on file   Highest education level: 12th grade  Occupational History   Occupation: Retired  Tobacco Use   Smoking status: Every Day    Current packs/day: 0.50    Types: Cigarettes   Smokeless tobacco: Never   Tobacco comments:    one pack lasts 41 hours  Substance and Sexual Activity   Alcohol use: Yes    Comment: one shot of liquor or beer daily   Drug use: Not Currently   Sexual activity: Not on file  Other Topics Concern   Not on file  Social History Narrative   Not on file   Social Drivers of Health   Financial Resource Strain: Not on file  Food Insecurity: No Food Insecurity (01/17/2024)   Hunger Vital Sign    Worried About Running Out of Food in the Last Year: Never true    Ran Out of Food in the Last Year: Never  true  Transportation Needs: No Transportation Needs (01/17/2024)   PRAPARE - Administrator, Civil Service (Medical): No    Lack of Transportation (Non-Medical): No  Physical Activity: Not on file  Stress: Not on file  Social Connections: Moderately Isolated (01/15/2024)   Social Connection and Isolation Panel    Frequency of Communication with Friends and Family: Three times a week    Frequency of  Social Gatherings with Friends and Family: Once a week    Attends Religious Services: Never    Database administrator or Organizations: No    Attends Engineer, structural: Never    Marital Status: Living with partner  Intimate Partner Violence: Not At Risk (01/15/2024)   Humiliation, Afraid, Rape, and Kick questionnaire    Fear of Current or Ex-Partner: No    Emotionally Abused: No    Physically Abused: No    Sexually Abused: No      Family History  Problem Relation Age of Onset   Heart attack Father    Heart attack Sister        PHYSICAL EXAM: General:  Well appearing. No respiratory difficulty HEENT: normal Neck: supple. no JVD. Carotids 2+ bilat; no bruits. No lymphadenopathy or thyromegaly appreciated. Cor: PMI nondisplaced. Regular rate & rhythm. No rubs, gallops or murmurs. Lungs: clear Abdomen: soft, nontender, nondistended. No hepatosplenomegaly. No bruits or masses. Good bowel sounds. Extremities: no cyanosis, clubbing, rash, edema Neuro: alert & oriented x 3, cranial nerves grossly intact. moves all 4 extremities w/o difficulty. Affect pleasant.  ECG:   ASSESSMENT & PLAN:  1: Chronic heart failure with reduced ejection fraction- - suspect due to  - NYHA Garcia - euvolemic - weighing daily -  - continue  - BNP  2: HTN- - BP - saw PCP - BMET  3: CAD- -  4: Alcohol use- -   5: Atrial fibrilaltion- - EKG  6: COPD- - smokes   Terry Garcia, Terry Garcia 01/23/24

## 2024-01-24 ENCOUNTER — Other Ambulatory Visit
Admission: RE | Admit: 2024-01-24 | Discharge: 2024-01-24 | Disposition: A | Discharge status: Home / Self Care | Attending: Family | Admitting: Family

## 2024-01-24 ENCOUNTER — Ambulatory Visit (HOSPITAL_BASED_OUTPATIENT_CLINIC_OR_DEPARTMENT_OTHER): Admitting: Family

## 2024-01-24 ENCOUNTER — Encounter: Payer: Self-pay | Admitting: Family

## 2024-01-24 VITALS — BP 107/53 | HR 45 | Wt 191.0 lb

## 2024-01-24 DIAGNOSIS — R001 Bradycardia, unspecified: Secondary | ICD-10-CM | POA: Insufficient documentation

## 2024-01-24 DIAGNOSIS — F1090 Alcohol use, unspecified, uncomplicated: Secondary | ICD-10-CM

## 2024-01-24 DIAGNOSIS — I493 Ventricular premature depolarization: Secondary | ICD-10-CM | POA: Insufficient documentation

## 2024-01-24 DIAGNOSIS — R002 Palpitations: Secondary | ICD-10-CM | POA: Insufficient documentation

## 2024-01-24 DIAGNOSIS — K219 Gastro-esophageal reflux disease without esophagitis: Secondary | ICD-10-CM | POA: Diagnosis not present

## 2024-01-24 DIAGNOSIS — I428 Other cardiomyopathies: Secondary | ICD-10-CM | POA: Diagnosis not present

## 2024-01-24 DIAGNOSIS — I110 Hypertensive heart disease with heart failure: Secondary | ICD-10-CM | POA: Diagnosis not present

## 2024-01-24 DIAGNOSIS — I5022 Chronic systolic (congestive) heart failure: Secondary | ICD-10-CM

## 2024-01-24 DIAGNOSIS — I2510 Atherosclerotic heart disease of native coronary artery without angina pectoris: Secondary | ICD-10-CM

## 2024-01-24 DIAGNOSIS — I4891 Unspecified atrial fibrillation: Secondary | ICD-10-CM | POA: Diagnosis not present

## 2024-01-24 DIAGNOSIS — Z7984 Long term (current) use of oral hypoglycemic drugs: Secondary | ICD-10-CM | POA: Insufficient documentation

## 2024-01-24 DIAGNOSIS — Z79899 Other long term (current) drug therapy: Secondary | ICD-10-CM | POA: Diagnosis not present

## 2024-01-24 DIAGNOSIS — J449 Chronic obstructive pulmonary disease, unspecified: Secondary | ICD-10-CM | POA: Diagnosis not present

## 2024-01-24 DIAGNOSIS — Z7901 Long term (current) use of anticoagulants: Secondary | ICD-10-CM | POA: Diagnosis not present

## 2024-01-24 DIAGNOSIS — I10 Essential (primary) hypertension: Secondary | ICD-10-CM | POA: Diagnosis not present

## 2024-01-24 DIAGNOSIS — R600 Localized edema: Secondary | ICD-10-CM | POA: Insufficient documentation

## 2024-01-24 DIAGNOSIS — I4510 Unspecified right bundle-branch block: Secondary | ICD-10-CM | POA: Diagnosis not present

## 2024-01-24 DIAGNOSIS — F17210 Nicotine dependence, cigarettes, uncomplicated: Secondary | ICD-10-CM | POA: Diagnosis not present

## 2024-01-24 DIAGNOSIS — R0602 Shortness of breath: Secondary | ICD-10-CM | POA: Diagnosis present

## 2024-01-24 DIAGNOSIS — I480 Paroxysmal atrial fibrillation: Secondary | ICD-10-CM

## 2024-01-24 DIAGNOSIS — I5021 Acute systolic (congestive) heart failure: Secondary | ICD-10-CM

## 2024-01-24 LAB — BASIC METABOLIC PANEL WITH GFR
Anion gap: 8 (ref 5–15)
BUN: 20 mg/dL (ref 8–23)
CO2: 30 mmol/L (ref 22–32)
Calcium: 8.8 mg/dL — ABNORMAL LOW (ref 8.9–10.3)
Chloride: 93 mmol/L — ABNORMAL LOW (ref 98–111)
Creatinine, Ser: 1.61 mg/dL — ABNORMAL HIGH (ref 0.61–1.24)
GFR, Estimated: 44 mL/min — ABNORMAL LOW (ref >60)
Glucose, Bld: 112 mg/dL — ABNORMAL HIGH (ref 70–99)
Potassium: 4.5 mmol/L (ref 3.5–5.1)
Sodium: 131 mmol/L — ABNORMAL LOW (ref 135–145)

## 2024-01-24 MED ORDER — SPIRONOLACTONE 25 MG PO TABS: 25.0000 mg | ORAL_TABLET | Freq: Every day | ORAL | 5 refills | Status: AC

## 2024-01-24 MED ORDER — METOPROLOL SUCCINATE ER 50 MG PO TB24: 50.0000 mg | ORAL_TABLET | Freq: Every day | ORAL | 5 refills | Status: DC

## 2024-01-24 NOTE — Patient Instructions (Addendum)
 Medication Changes:  DECREASE METOPROLOL  TO 50 MG ONCE DAILY  INCREASE SPIRONOLACTONE  25 MG ONCE DAILY  TAKE AN EXTRA 40 MG OF LASIX  FOR 2 DAYS   Lab Work:  Go over to the [facility]. Go pass the gift shop and have your blood work completed.  We will only call you if the results are abnormal or if the provider would like to make medication changes.   Referral:  We have placed a referral today to General Cardiology. They should reach out to you within 1-2 weeks. If they do not, please reach out to them. Their information is below on this AVS.   Special Instructions // Education:  It was a pleasure meeting you today!  Begin weighing daily and call for an overnight weight gain of 3 pounds or more or a weekly weight gain of more than 5 pounds.   WEAR COMPRESSION SOCKS DAILY AND REMOVE AT BEDTIME.   Follow-Up in: 3 weeks with Ellouise Class, FNP.  At the [facility], you and your health needs are our priority. We have a designated team specialized in the treatment of Heart Failure. This Care Team includes your primary Heart Failure Specialized Cardiologist (physician), Advanced Practice Providers (APPs- Physician Assistants and Nurse Practitioners), and Pharmacist who all work together to provide you with the care you need, when you need it.   You may see any of the following providers on your designated Care Team at your next follow up:  Dr. Toribio Fuel Dr. Ezra Shuck Dr. Ria Commander Dr. Odis Brownie Ellouise Class, FNP Jaun Bash, RPH-CPP  Please be sure to bring in all your medications bottles to every appointment.   Need to Contact Us :  If you have any questions or concerns before your next appointment please send us  a message through [place] or call our office at [phone].    TO LEAVE A MESSAGE FOR THE NURSE SELECT OPTION 2, PLEASE LEAVE A MESSAGE INCLUDING: YOUR NAME DATE OF BIRTH CALL BACK NUMBER REASON FOR CALL**this is important  as we prioritize the call backs  YOU WILL RECEIVE A CALL BACK THE SAME DAY AS LONG AS YOU CALL BEFORE 4:00 PM

## 2024-01-25 ENCOUNTER — Ambulatory Visit: Payer: Self-pay | Admitting: Family

## 2024-02-01 NOTE — Progress Notes (Unsigned)
 Cardiology Office Note    Date:  02/03/2024   ID:  Terry Garcia, DOB 12/31/1945, MRN [id number]  PCP:  Terry Garcia   Cardiologist:  Deatrice Cage, MD  Electrophysiologist:  None   Chief Complaint: Hospital follow-up  History of Present Illness:   Terry Garcia is a 78 y.o. male with history of COPD, tobacco abuse, alcohol abuse, HFrEF, mixed ischemic and nonischemic cardiomyopathy, hypertension, RBBB, persistent atrial fibrillation, and aortic atherosclerosis who is being seen today for hospital follow-up.  Patient initially reported to the [facility] ED 01/17/2024 after a fall at home.  He subsequently reported several days of worsening dyspnea on exertion and palpitations.  Had previously been seen by his PCP and diagnosed with heart failure and was waiting to be evaluated by cardiology.  Found to be in atrial fibrillation with RVR.  Echo was obtained and revealed reduced EF of 35 to 40% with global hypokinesis.  Cardiology was asked to consult for further evaluation of heart failure and A-fib.  He underwent right and left heart catheterization 7/15 which revealed multivessel CAD with heavy calcification, mildly elevated left heart filling pressures, and mildly to moderately elevated right heart filling pressures.  Findings felt to be consistent with mixed ischemic and nonischemic cardiomyopathy.  Given no symptoms of angina, medical management was recommended.  He underwent TEE/DCCV on 7/16 and was cardioverted 4 times without success.  He was started on oral amiodarone  load with recommendation for repeat cardioversion in 3 weeks time.  GDMT was titrated and he was discharged on 7/17.  Patient was most recently seen by [facility] advanced heart failure clinic 01/24/2024 and reported ongoing dyspnea.  He has noted to be in sinus bradycardia and metoprolol  succinate was subsequently reduced to 50 mg daily. He was also noted to have weight gain and was asked to  double his dose of Lasix  for the following 2 days.   Patient presents to clinic today overall doing well from a cardiac perspective. He reports improvements in dyspnea and lower extremity swelling after taking the additional doses of Lasix . He reports compliance with his medications without adverse effect. He has not had any alcohol since discharge from the hospital. He continues to smoke cigarettes ~1 ppd. He takes his BP at home and reports that his values have been typically running in the 120s/70s. He denies chest pain, shortness of breath, palpitations, lightheadedness, dizziness, orthopnea, and PND. He denies bleeding and hematochezia.   Labs independently reviewed: 01/24/2024- Na 131, K 4.5, BUN 20, Cr 1.61 01/20/2024- Hgb 13.7, HCT 40.8 01/17/2024- TC 128, TG 53, HDL 62, LDL 55  Objective   Past Medical History:  Diagnosis Date   Arterial atherosclerosis    per pt   COPD (chronic obstructive pulmonary disease) (HCC)    controlled with daily inhaler and albuterol  as needed   GERD (gastroesophageal reflux disease)    no meds needed   History of kidney stones    needed open abdominal surgery for stone in 1978, no stones since   Hypertension    amlodipine  and lisinopril/HCTZ   Right bundle branch block    per pt, managed by primary, does not see cardiologist    Current Medications: Current Meds  Medication Sig   albuterol  (PROVENTIL  HFA;VENTOLIN  HFA) 108 (90 Base) MCG/ACT inhaler Inhale 1-2 puffs into the lungs every 6 (six) hours as needed for wheezing or shortness of breath.   amiodarone  (PACERONE ) 200 MG tablet Take 1 tablet (200 mg total)  by mouth daily.   apixaban  (ELIQUIS ) 5 MG TABS tablet Take 1 tablet (5 mg total) by mouth 2 (two) times daily.   atorvastatin  (LIPITOR) 10 MG tablet Take 1 tablet (10 mg total) by mouth daily.   dapagliflozin  propanediol (FARXIGA ) 10 MG TABS tablet Take 1 tablet (10 mg total) by mouth daily.   folic acid  (FOLVITE ) 1 MG tablet Take 1 tablet (1  mg total) by mouth daily.   Glycopyrrolate-Formoterol (BEVESPI AEROSPHERE) 9-4.8 MCG/ACT AERO Inhale 2 puffs into the lungs at bedtime.   losartan  (COZAAR ) 25 MG tablet Take 1 tablet (25 mg total) by mouth daily.   nicotine  (NICODERM CQ  - DOSED IN MG/24 HOURS) 14 mg/24hr patch Place 1 patch (14 mg total) onto the skin daily.   nicotine  polacrilex (NICOTINE  MINI) 4 MG lozenge Take 1 lozenge (4 mg total) by mouth as needed.   spironolactone  (ALDACTONE ) 25 MG tablet Take 1 tablet (25 mg total) by mouth daily.   thiamine  (VITAMIN B-1) 100 MG tablet Take 1 tablet (100 mg total) by mouth daily.   [DISCONTINUED] amiodarone  (PACERONE ) 200 MG tablet Take 2 tablets (400 mg total) by mouth 2 (two) times daily for 7 days, THEN 1 tablet (200 mg total) 2 (two) times daily for 7 days, THEN 1 tablet (200 mg total) daily for 14 days.   [DISCONTINUED] furosemide  (LASIX ) 40 MG tablet Take 1 tablet (40 mg total) by mouth daily.   [DISCONTINUED] metoprolol  succinate (TOPROL -XL) 50 MG 24 hr tablet Take 1 tablet (50 mg total) by mouth daily. Take with or immediately following a meal.    Allergies:   Budeson-glycopyrrol-formoterol, Symbicort [budesonide-formoterol fumarate], Advair diskus [fluticasone-salmeterol], and Levaquin [levofloxacin]   Social History   Socioeconomic History   Marital status: Married    Spouse name: Terry Garcia   Number of children: 2   Years of education: Not on file   Highest education level: 12th grade  Occupational History   Occupation: Retired  Tobacco Use   Smoking status: Every Day    Current packs/day: 0.50    Types: Cigarettes   Smokeless tobacco: Never   Tobacco comments:    one pack lasts 41 hours  Substance and Sexual Activity   Alcohol use: Yes    Comment: one shot of liquor or beer daily   Drug use: Not Currently   Sexual activity: Not on file  Other Topics Concern   Not on file  Social History Narrative   Not on file   Social Drivers of Health   Financial  Resource Strain: Not on file  Food Insecurity: No Food Insecurity (01/17/2024)   Hunger Vital Sign    Worried About Running Out of Food in the Last Year: Never true    Ran Out of Food in the Last Year: Never true  Transportation Needs: No Transportation Needs (01/17/2024)   PRAPARE - Administrator, Civil Service (Medical): No    Lack of Transportation (Non-Medical): No  Physical Activity: Not on file  Stress: Not on file  Social Connections: Moderately Isolated (01/15/2024)   Social Connection and Isolation Panel    Frequency of Communication with Friends and Family: Three times a week    Frequency of Social Gatherings with Friends and Family: Once a week    Attends Religious Services: Never    Database administrator or Organizations: No    Attends Banker Meetings: Never    Marital Status: Living with partner     Family History:  The patient's family history includes Heart attack in his father and sister.  ROS:   12-point review of systems is negative unless otherwise noted in the HPI.  EKGs/Other Studies Reviewed:    Studies reviewed were summarized above. The additional studies were reviewed today:  01/18/2024 LHC Multivessel coronary artery disease with heavy calcification, as detailed below, including 70% mid LAD stenosis, chronically occluded first septal branch, sequential 50% & 90% LCx lesions, and tandem 50% RCA stenoses.  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 Mildly elevated left heart filling pressures. Mildly-moderately elevated right heart filling pressures. Normal Fick cardiac output/index.  01/16/2024 Echo complete 1. Left ventricular ejection fraction, by estimation, is 35 to 40%. The left ventricle has moderately decreased function. The left ventricle demonstrates global hypokinesis. Left ventricular diastolic parameters are consistent with Grade I diastolic dysfunction (impaired relaxation).   2. Right ventricular  systolic function is mildly reduced. The right ventricular size is mildly enlarged. Mildly increased right ventricular wall thickness.   3. Left atrial size was mildly dilated.   4. Right atrial size was moderately dilated.   5. The mitral valve is normal in structure. No evidence of mitral valve regurgitation.   6. The aortic valve is normal in structure. Aortic valve regurgitation is not visualized.  EKG:  EKG personally reviewed by me today    PHYSICAL EXAM:    VS:  BP 139/69   Pulse (!) 38   Ht 5' 11 (1.803 m)   Wt 183 lb 6.4 oz (83.2 kg)   SpO2 96%   BMI 25.58 kg/m   BMI: Body mass index is 25.58 kg/m.  Physical Exam Vitals and nursing note reviewed.  Constitutional:      Appearance: Normal appearance.  Cardiovascular:     Rate and Rhythm: Normal rate and regular rhythm.     Heart sounds: No murmur heard.    No gallop.  Pulmonary:     Effort: Pulmonary effort is normal.     Breath sounds: Normal breath sounds. No wheezing or rales.  Musculoskeletal:     Right lower leg: Edema (1+) present.     Left lower leg: Edema (1+) present.  Skin:    General: Skin is warm and dry.  Neurological:     General: No focal deficit present.     Mental Status: He is alert and oriented to person, place, and time. Mental status is at baseline.  Psychiatric:        Mood and Affect: Mood normal.        Behavior: Behavior normal.    Wt Readings from Last 3 Encounters:  02/03/24 183 lb 6.4 oz (83.2 kg)  01/24/24 191 lb (86.6 kg)  01/18/24 188 lb 7.9 oz (85.5 kg)       ASSESSMENT & PLAN:   HFrEF Mixed ischemic and nonischemic cardiomyopathy - Recent hospitalization 7/11-7/17 during which he was found to have newly diagnosed CHF with EF 35-40%. Cardiac catheterization showed multivessel CAD with medical management recommended. Findings suggesting mixed ischemic and nonischemic cardiomyopathy secondary to atrial fibrillation and alcohol. He appears somewhat volume up on exam. Increase  Lasix  to 60 mg daily. Check BMP today. Continue Farxiga  10 mg daily, losartan  25 mg daily, and spironolactone  25 mg daily. Plan to increase GDMT as BP/kidney function allows.   Persistent atrial fibrillation - Recent hospitalization as above where he was found to have newly diagnosed atrial fibrillation with difficult to control rates. TEE DCCV was attempted but not successful. He was started on  oral amiodarone  load. At appointment with AHF 7/21 he was noted to be back in sinus rhythm. Remains in sinus bradycardia today. Reduce metoprolol  succinate to 12.5 mg daily. Continue Eliquis  5 mg twice daily. Denies bleeding and hematochezia. Continue amiodarone  200 mg daily.   Coronary artery disease - Cardiac cath showed multivessel CAD with heavy calcification, detailed above. Medical management recommended. Continue atorvastatin  10 mg daily. No ASA in the setting of long term DOAC.   Hypertension - BP has been well controlled per at home readings. Continue antihypertensives as above.   Alcohol abuse Tobacco abuse - Patient denies alcohol use since hospital discharge. He endorses ongoing tobacco use of ~1 ppd. I encouraged him to work towards cutting back to 1/2 ppd with eventual cessation recommended.    Disposition: F/u with Dr. Darron or an APP in 1 week.   Medication Adjustments/Labs and Tests Ordered: Current medicines are reviewed at length with the patient today.  Concerns regarding medicines are outlined above. Medication changes, Labs and Tests ordered today are summarized above and listed in the Patient Instructions accessible in Encounters.   Bonney Lesley Maffucci, PA-C 02/03/2024 11:58 AM     [facility] [place] [address] [place], [place] [address] [phone]

## 2024-02-03 ENCOUNTER — Ambulatory Visit: Attending: Physician Assistant | Admitting: Physician Assistant

## 2024-02-03 ENCOUNTER — Telehealth: Payer: Self-pay | Admitting: Physician Assistant

## 2024-02-03 ENCOUNTER — Other Ambulatory Visit: Payer: Self-pay | Admitting: *Deleted

## 2024-02-03 ENCOUNTER — Encounter: Payer: Self-pay | Admitting: Physician Assistant

## 2024-02-03 VITALS — BP 139/69 | HR 38 | Ht 71.0 in | Wt 183.4 lb

## 2024-02-03 DIAGNOSIS — I4819 Other persistent atrial fibrillation: Secondary | ICD-10-CM | POA: Diagnosis not present

## 2024-02-03 DIAGNOSIS — I10 Essential (primary) hypertension: Secondary | ICD-10-CM

## 2024-02-03 DIAGNOSIS — I2510 Atherosclerotic heart disease of native coronary artery without angina pectoris: Secondary | ICD-10-CM | POA: Diagnosis not present

## 2024-02-03 DIAGNOSIS — Z79899 Other long term (current) drug therapy: Secondary | ICD-10-CM

## 2024-02-03 DIAGNOSIS — F1090 Alcohol use, unspecified, uncomplicated: Secondary | ICD-10-CM

## 2024-02-03 DIAGNOSIS — I5020 Unspecified systolic (congestive) heart failure: Secondary | ICD-10-CM

## 2024-02-03 DIAGNOSIS — Z720 Tobacco use: Secondary | ICD-10-CM

## 2024-02-03 MED ORDER — AMIODARONE HCL 200 MG PO TABS
200.0000 mg | ORAL_TABLET | Freq: Every day | ORAL | 3 refills | Status: AC
Start: 2024-02-03 — End: ?

## 2024-02-03 MED ORDER — FUROSEMIDE 40 MG PO TABS: 60.0000 mg | ORAL_TABLET | Freq: Every day | ORAL | 3 refills | Status: AC

## 2024-02-03 MED ORDER — METOPROLOL SUCCINATE ER 25 MG PO TB24: 12.5000 mg | ORAL_TABLET | Freq: Every day | ORAL | 3 refills | Status: AC

## 2024-02-03 NOTE — Patient Instructions (Addendum)
 Medication Instructions:  Your physician recommends the following medication changes.  DECREASE: Amiodarone  200 mg daily Metoprolol  12.5 mg daily  INCREASE: Lasix  60 mg daily   *If you need a refill on your cardiac medications before your next appointment, please call your pharmacy*  Lab Work: Your provider would like for you to have following labs drawn today BMeT.   If you have labs (blood work) drawn today and your tests are completely normal, you will receive your results only by: MyChart Message (if you have MyChart) OR A paper copy in the mail If you have any lab test that is abnormal or we need to change your treatment, we will call you to review the results.  Follow-Up: At [facility], you and your health needs are our priority.  As part of our continuing mission to provide you with exceptional heart care, our providers are all part of one team.  This team includes your primary Cardiologist (physician) and Advanced Practice Providers or APPs (Physician Assistants and Nurse Practitioners) who all work together to provide you with the care you need, when you need it.  Your next appointment:   1 week(s) with Medford Meager on 8 August - ok to double book  Provider:   You may see Deatrice Cage, MD or one of the following Advanced Practice Providers on your designated Care Team:   Lesley Maffucci, PA-C

## 2024-02-03 NOTE — Telephone Encounter (Signed)
 Returned patient's call regarding his Furosemide  prescription. He asked if he could use his remaining six 20 mg tablets to make his daily dose of 60 mg, in addition to his current prescription of 40 mg tablets. I advised him that as long as the 20 mg tablets were not expired, this would be acceptable. Patient thanked me for the clarification.

## 2024-02-03 NOTE — Telephone Encounter (Signed)
 Pt c/o medication issue:  1. Name of Medication: Furosemide   2. How are you currently taking this medication (dosage and times per day)?   3. Are you having a reaction (difficulty breathing--STAT)?   4. What is your medication issue? Patient says he is now taking 60 mg of Furosemide - He says he has some 40 mg that he has left and he has a few 20 mg left from another doctor. Can he take those 20 mg along with his 40 mg  that will total 60 mg?

## 2024-02-04 LAB — BASIC METABOLIC PANEL WITH GFR
BUN/Creatinine Ratio: 9 — ABNORMAL LOW (ref 10–24)
BUN: 12 mg/dL (ref 8–27)
CO2: 22 mmol/L (ref 20–29)
Calcium: 9.2 mg/dL (ref 8.6–10.2)
Chloride: 93 mmol/L — ABNORMAL LOW (ref 96–106)
Creatinine, Ser: 1.28 mg/dL — ABNORMAL HIGH (ref 0.76–1.27)
Glucose: 83 mg/dL (ref 70–99)
Potassium: 4.6 mmol/L (ref 3.5–5.2)
Sodium: 134 mmol/L (ref 134–144)
eGFR: 58 mL/min/1.73 — ABNORMAL LOW (ref >59)

## 2024-02-07 ENCOUNTER — Ambulatory Visit: Payer: Self-pay | Admitting: Physician Assistant

## 2024-02-10 NOTE — Progress Notes (Unsigned)
 Cardiology Office Note    Date:  02/11/2024   ID:  Terry Garcia, DOB [birth year], MRN [id number]  PCP:  [facility]   Cardiologist:  Deatrice Cage, MD  Electrophysiologist:  None   Chief Complaint: Follow up  History of Present Illness:   Terry Garcia is a 78 y.o. male with history of COPD, tobacco abuse, alcohol abuse, HFrEF, mixed ischemic and nonischemic cardiomyopathy, hypertension, RBBB, persistent atrial fibrillation, and aortic atherosclerosis who is being seen today for follow up on HFrEF and atrial fibrillation.     Patient initially reported to the [facility] ED 01/17/2024 after a fall at home. He subsequently reported several days of worsening dyspnea on exertion and palpitations. Had previously been seen by his PCP and diagnosed with heart failure and was waiting to be evaluated by cardiology. Found to be in atrial fibrillation with RVR. Echo was obtained and revealed reduced EF of 35 to 40% with global hypokinesis. Cardiology was asked to consult for further evaluation of heart failure and A-fib. He underwent right and left heart catheterization 7/15 which revealed multivessel CAD with heavy calcification, mildly elevated left heart filling pressures, and mildly to moderately elevated right heart filling pressures. Findings felt to be consistent with mixed ischemic and nonischemic cardiomyopathy. Given no symptoms of angina, medical management was recommended. He underwent TEE/DCCV on 7/16 and was cardioverted 4 times without success. He was started on oral amiodarone  load with recommendation for repeat cardioversion in 3 weeks time. GDMT was titrated and he was discharged on 7/17.   Patient was seen by advanced heart failure clinic 01/24/2024 and reported ongoing dyspnea.  He was noted to be in sinus bradycardia and metoprolol  succinate was subsequently reduced to 50 mg daily.  He was also noted to have weight gain and was asked to double his dose of  Lasix  for the following 2 days.    Patient was most recently seen by myself 02/03/2024 for hospital follow-up.  He reported overall doing well from a cardiac perspective.  He was noted to have significant bradycardia and metoprolol  succinate was reduced to 12.5 mg daily.  He remains somewhat volume overloaded on exam and Lasix  was subsequently increased to 60 mg daily.  Patient presents to clinic today overall doing well from a cardiac perspective.  He endorses mild lower extremity swelling which is overall improving on your dose of Lasix .  He feels that he has gotten a lot of his energy back with his increased heart rate.  He takes his blood pressure at home and reports on average his systolic pressure runs 130s to 140s mmHg.  He is without symptoms of angina or cardiac decompensation. He denies any chest pain, lightheadedness, dizziness, shortness of breath, orthopnea, and PND.  He denies bleeding and hematochezia.  Labs independently reviewed: 02/03/2024 BUN 12, creatinine 1.28, sodium 134, potassium 4.6 01/20/2024- Hgb 13.7, HCT 40.8 01/17/2024- TC 128, TG 53, HDL 62, LDL 55  Objective   Past Medical History:  Diagnosis Date   Arterial atherosclerosis    per pt   COPD (chronic obstructive pulmonary disease) (HCC)    controlled with daily inhaler and albuterol  as needed   GERD (gastroesophageal reflux disease)    no meds needed   History of kidney stones    needed open abdominal surgery for stone in 1978, no stones since   Hypertension    amlodipine  and lisinopril/HCTZ   Right bundle branch block    per pt, managed by primary,  does not see cardiologist    Current Medications: Current Meds  Medication Sig   albuterol  (PROVENTIL  HFA;VENTOLIN  HFA) 108 (90 Base) MCG/ACT inhaler Inhale 1-2 puffs into the lungs every 6 (six) hours as needed for wheezing or shortness of breath.   amiodarone  (PACERONE ) 200 MG tablet Take 1 tablet (200 mg total) by mouth daily.   apixaban  (ELIQUIS ) 5 MG TABS  tablet Take 1 tablet (5 mg total) by mouth 2 (two) times daily.   atorvastatin  (LIPITOR) 10 MG tablet Take 1 tablet (10 mg total) by mouth daily.   dapagliflozin  propanediol (FARXIGA ) 10 MG TABS tablet Take 1 tablet (10 mg total) by mouth daily.   folic acid  (FOLVITE ) 1 MG tablet Take 1 tablet (1 mg total) by mouth daily.   furosemide  (LASIX ) 40 MG tablet Take 1.5 tablets (60 mg total) by mouth daily.   Glycopyrrolate-Formoterol (BEVESPI AEROSPHERE) 9-4.8 MCG/ACT AERO Inhale 2 puffs into the lungs at bedtime.   metoprolol  succinate (TOPROL -XL) 25 MG 24 hr tablet Take 0.5 tablets (12.5 mg total) by mouth daily. Take with or immediately following a meal.   nicotine  (NICODERM CQ  - DOSED IN MG/24 HOURS) 14 mg/24hr patch Place 1 patch (14 mg total) onto the skin daily.   nicotine  polacrilex (NICOTINE  MINI) 4 MG lozenge Take 1 lozenge (4 mg total) by mouth as needed.   spironolactone  (ALDACTONE ) 25 MG tablet Take 1 tablet (25 mg total) by mouth daily.   thiamine  (VITAMIN B-1) 100 MG tablet Take 1 tablet (100 mg total) by mouth daily.   [DISCONTINUED] losartan  (COZAAR ) 25 MG tablet Take 1 tablet (25 mg total) by mouth daily.   Current Facility-Administered Medications for the 02/11/24 encounter (Office Visit) with [name], NP  Medication   sacubitril -valsartan  (ENTRESTO ) 24-26 mg per tablet    Allergies:   Budeson-glycopyrrol-formoterol, Symbicort [budesonide-formoterol fumarate], Advair diskus [fluticasone-salmeterol], and Levaquin [levofloxacin]   Social History   Socioeconomic History   Marital status: Married    Spouse name: Nena   Number of children: 2   Years of education: Not on file   Highest education level: 12th grade  Occupational History   Occupation: Retired  Tobacco Use   Smoking status: Every Day    Current packs/day: 0.50    Types: Cigarettes   Smokeless tobacco: Never   Tobacco comments:    one pack lasts 41 hours  Substance and Sexual Activity    Alcohol use: Yes    Comment: one shot of liquor or beer daily   Drug use: Not Currently   Sexual activity: Not on file  Other Topics Concern   Not on file  Social History Narrative   Not on file   Social Drivers of Health   Financial Resource Strain: Not on file  Food Insecurity: No Food Insecurity (01/17/2024)   Hunger Vital Sign    Worried About Running Out of Food in the Last Year: Never true    Ran Out of Food in the Last Year: Never true  Transportation Needs: No Transportation Needs (01/17/2024)   PRAPARE - Administrator, Civil Service (Medical): No    Lack of Transportation (Non-Medical): No  Physical Activity: Not on file  Stress: Not on file  Social Connections: Moderately Isolated (01/15/2024)   Social Connection and Isolation Panel    Frequency of Communication with Friends and Family: Three times a week    Frequency of Social Gatherings with Friends and Family: Once a week    Attends Religious Services:  Never    Active Member of Clubs or Organizations: No    Attends Banker Meetings: Never    Marital Status: Living with partner     Family History:  The patient's family history includes Heart attack in his father and sister.  ROS:   12-point review of systems is negative unless otherwise noted in the HPI.  EKGs/Other Studies Reviewed:    Studies reviewed were summarized above. The additional studies were reviewed today:  01/18/2024 LHC Multivessel coronary artery disease with heavy calcification, as detailed below, including 70% mid LAD stenosis, chronically occluded first septal branch, sequential 50% & 90% LCx lesions, and tandem 50% RCA stenoses.  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 Mildly elevated left heart filling pressures. Mildly-moderately elevated right heart filling pressures. Normal Fick cardiac output/index.   01/16/2024 Echo complete 1. Left ventricular ejection fraction, by estimation, is 35 to  40%. The left ventricle has moderately decreased function. The left ventricle demonstrates global hypokinesis. Left ventricular diastolic parameters are consistent with Grade I diastolic dysfunction (impaired relaxation).   2. Right ventricular systolic function is mildly reduced. The right ventricular size is mildly enlarged. Mildly increased right ventricular wall thickness.   3. Left atrial size was mildly dilated.   4. Right atrial size was moderately dilated.   5. The mitral valve is normal in structure. No evidence of mitral valve regurgitation.   6. The aortic valve is normal in structure. Aortic valve regurgitation is not visualized  EKG:  EKG personally reviewed by me today EKG Interpretation Date/Time:  Friday February 11 2024 11:32:31 EDT Ventricular Rate:  57 PR Interval:  222 QRS Duration:  174 QT Interval:  544 QTC Calculation: 529 R Axis:   -74  Text Interpretation: Sinus bradycardia with 1st degree A-V block Right bundle branch block Left anterior fascicular block Bifascicular block Septal infarct (cited on or before 24-Jan-2024) When compared with ECG of 03-Feb-2024 11:11, Vent. rate has increased BY  19 BPM Questionable change in initial forces of Septal leads Nonspecific T wave abnormality has replaced inverted T waves in Inferior leads Confirmed by Lorene Sinclair ([id number]) on 02/11/2024 11:37:42 AM  PHYSICAL EXAM:    VS:  BP (!) 146/64   Pulse (!) 57   Ht 5' 11 (1.803 m)   Wt 177 lb 6.4 oz (80.5 kg)   SpO2 98%   BMI 24.74 kg/m   BMI: Body mass index is 24.74 kg/m.  Physical Exam Vitals and nursing note reviewed.  Constitutional:      Appearance: Normal appearance.  Cardiovascular:     Rate and Rhythm: Normal rate and regular rhythm.     Heart sounds: No murmur heard.    No gallop.  Pulmonary:     Effort: Pulmonary effort is normal.     Breath sounds: Normal breath sounds. No wheezing or rales.  Musculoskeletal:     Right lower leg: Edema (trace) present.      Left lower leg: Edema (trace) present.  Skin:    General: Skin is warm and dry.  Neurological:     General: No focal deficit present.     Mental Status: He is alert and oriented to person, place, and time. Mental status is at baseline.  Psychiatric:        Mood and Affect: Mood normal.        Behavior: Behavior normal.     Wt Readings from Last 3 Encounters:  02/11/24 177 lb 6.4 oz (80.5 kg)  02/03/24 183  lb 6.4 oz (83.2 kg)  01/24/24 191 lb (86.6 kg)        ASSESSMENT & PLAN:   HFrEF Mixed ischemic and nonischemic cardiomyopathy - Recent hospitalization 7/11 through 7/17 during which she was found to have newly diagnosed CHF with EF 35 to 40%.  Cardiac catheterization showed multivessel CAD with medical management recommended.  Findings suggested mixed ischemic and nonischemic cardiomyopathy secondary to atrial fibrillation and alcohol use.  He appears relatively euvolemic on exam today.  Check CMP given his increased Lasix  dose at last appointment.  Blood pressure is mildly elevated today and per our home readings.  Will discontinue losartan  and transition to Entresto  24-26 mg twice daily.  Continue metoprolol  succinate 12.5 mg daily, Farxiga  10 mg daily, and spironolactone  25 mg daily.  Recheck BMP in 1 week.  Repeat echo ordered to be completed prior to follow-up.  Persistent atrial fibrillation - Recent hospitalization as above where he was found to have newly diagnosed atrial fibrillation with difficult to control rates.  TEE DCCV was attempted but unsuccessful.  He was started on oral amiodarone  load.  At appointment with [facility] 7/21 he was noted to be in sinus rhythm.  At my last visit, he was markedly bradycardic and the dose of metoprolol  succinate was subsequently reduced.  Heart rate improved today.  Continue metoprolol  succinate 12.5 mg daily, Eliquis  5 mg twice daily, and amiodarone  200 mg daily.  Denies bleeding and hematochezia.  Check CMP and TSH today.  Coronary artery  disease - Cardiac cath showed multivessel CAD with heavy calcification, detailed above.  Medical management was recommended. No symptoms of angina or cardiac decompensation. Continue atorvastatin  10 mg daily.  No aspirin  in the setting of long-term DOAC.  Repeat echo as above.  If EF remains low, consider reviewing cath films with interventional cardiology for possible intervention.  Hypertension - BP mildly elevated.  Today and per home readings.  Transition from losartan  to Entresto  as above.  Alcohol abuse Tobacco abuse - Patient denies alcohol use since hospital discharge.  He endorses ongoing tobacco use of ~1 ppd, sometimes less.  I encouraged him to work towards cutting back to 1/2 ppd with eventual cessation recommended.   Disposition: Check CMP and TSH today. Repeat BMP in 1 week. Repeat echo prior to 3 month follow up. F/u with Dr. Darron or an APP in 3 months.   Medication Adjustments/Labs and Tests Ordered: Current medicines are reviewed at length with the patient today.  Concerns regarding medicines are outlined above. Medication changes, Labs and Tests ordered today are summarized above and listed in the Patient Instructions accessible in Encounters.   Bonney Lesley Maffucci, PA-C 02/11/2024 12:49 PM     [facility] [place] [address] [place], [place] [address] [phone]

## 2024-02-11 ENCOUNTER — Ambulatory Visit: Attending: Nurse Practitioner | Admitting: Physician Assistant

## 2024-02-11 ENCOUNTER — Encounter: Payer: Self-pay | Admitting: Nurse Practitioner

## 2024-02-11 VITALS — BP 146/64 | HR 57 | Ht 71.0 in | Wt 177.4 lb

## 2024-02-11 DIAGNOSIS — I2510 Atherosclerotic heart disease of native coronary artery without angina pectoris: Secondary | ICD-10-CM

## 2024-02-11 DIAGNOSIS — I10 Essential (primary) hypertension: Secondary | ICD-10-CM

## 2024-02-11 DIAGNOSIS — I4819 Other persistent atrial fibrillation: Secondary | ICD-10-CM | POA: Diagnosis not present

## 2024-02-11 DIAGNOSIS — I5020 Unspecified systolic (congestive) heart failure: Secondary | ICD-10-CM

## 2024-02-11 DIAGNOSIS — Z720 Tobacco use: Secondary | ICD-10-CM

## 2024-02-11 DIAGNOSIS — Z79899 Other long term (current) drug therapy: Secondary | ICD-10-CM | POA: Diagnosis not present

## 2024-02-11 DIAGNOSIS — F1090 Alcohol use, unspecified, uncomplicated: Secondary | ICD-10-CM

## 2024-02-11 MED ORDER — SACUBITRIL-VALSARTAN 24-26 MG PO TABS: 1.0000 | ORAL_TABLET | Freq: Two times a day (BID) | ORAL | Status: DC

## 2024-02-11 NOTE — Patient Instructions (Addendum)
 Medication Instructions:  Your physician recommends the following medication changes.  STOP TAKING: Losartan  (Cozaar ) 25 mg    START TAKING: Entresto  24/26 mg  Continue all other medications as prescribed.  *If you need a refill on your cardiac medications before your next appointment, please call your pharmacy*  Lab Work:  Your provider would like for you to have following labs drawn today CMP, TSH.   Your provider would like for you to return in 1 week  to have the following labs drawn: BMET.   Please go to [facility] [address] ([facility] [address], [place] [address] You do not need an appointment.  They are open from 8 am- 4:30 pm.  Lunch from 1:00 pm- 2:00 pm   If you have labs (blood work) drawn today and your tests are completely normal, you will receive your results only by: MyChart Message (if you have MyChart) OR A paper copy in the mail If you have any lab test that is abnormal or we need to change your treatment, we will call you to review the results.  Testing/Procedures:  Your physician has requested that you have an echocardiogram (Schedule in 2 months).  Echocardiography is a painless test that uses sound waves to create images of your heart. It provides your doctor with information about the size and shape of your heart and how well your heart's chambers and valves are working.   You may receive an ultrasound enhancing agent through an IV if needed to better visualize your heart during the echo. This procedure takes approximately one hour.  There are no restrictions for this procedure.  This will take place at [address] [facility]) [address], [place] [address]  Please note: We ask at that you not bring children with you during ultrasound (echo/ vascular) testing. Due to room size and safety concerns, children are not allowed in the ultrasound rooms during exams. Our front office staff cannot provide observation of  children in our lobby area while testing is being conducted. An adult accompanying a patient to their appointment will only be allowed in the ultrasound room at the discretion of the ultrasound technician under special circumstances. We apologize for any inconvenience.   Follow-Up: At [facility], you and your health needs are our priority.  As part of our continuing mission to provide you with exceptional heart care, our providers are all part of one team.  This team includes your primary Cardiologist (physician) and Advanced Practice Providers or APPs (Physician Assistants and Nurse Practitioners) who all work together to provide you with the care you need, when you need it.  Your next appointment:   3 month(s)  Provider:    Lesley Maffucci, PA-C   We recommend signing up for the patient portal called MyChart.  Sign up information is provided on this After Visit Summary.  MyChart is used to connect with patients for Virtual Visits (Telemedicine).  Patients are able to view lab/test results, encounter notes, upcoming appointments, etc.  Non-urgent messages can be sent to your provider as well.   To learn more about what you can do with MyChart, go to [email or web address].

## 2024-02-12 LAB — COMPREHENSIVE METABOLIC PANEL WITH GFR
ALT: 29 IU/L (ref 0–44)
AST: 24 IU/L (ref 0–40)
Albumin: 4.5 g/dL (ref 3.8–4.8)
Alkaline Phosphatase: 92 IU/L (ref 44–121)
BUN/Creatinine Ratio: 10 (ref 10–24)
BUN: 12 mg/dL (ref 8–27)
Bilirubin Total: 0.7 mg/dL (ref 0.0–1.2)
CO2: 25 mmol/L (ref 20–29)
Calcium: 9.3 mg/dL (ref 8.6–10.2)
Chloride: 90 mmol/L — ABNORMAL LOW (ref 96–106)
Creatinine, Ser: 1.21 mg/dL (ref 0.76–1.27)
Globulin, Total: 2.4 g/dL (ref 1.5–4.5)
Glucose: 88 mg/dL (ref 70–99)
Potassium: 3.6 mmol/L (ref 3.5–5.2)
Sodium: 134 mmol/L (ref 134–144)
Total Protein: 6.9 g/dL (ref 6.0–8.5)
eGFR: 62 mL/min/1.73 (ref >59)

## 2024-02-12 LAB — TSH: TSH: 3.9 u[IU]/mL (ref 0.450–4.500)

## 2024-02-14 ENCOUNTER — Telehealth: Payer: Self-pay | Admitting: Cardiovascular Disease

## 2024-02-14 ENCOUNTER — Encounter: Admitting: Family

## 2024-02-14 ENCOUNTER — Ambulatory Visit: Payer: Self-pay | Admitting: Physician Assistant

## 2024-02-14 ENCOUNTER — Other Ambulatory Visit: Payer: Self-pay

## 2024-02-14 DIAGNOSIS — Z79899 Other long term (current) drug therapy: Secondary | ICD-10-CM

## 2024-02-14 MED ORDER — DAPAGLIFLOZIN PROPANEDIOL 10 MG PO TABS: 10.0000 mg | ORAL_TABLET | Freq: Every day | ORAL | 3 refills | Status: AC

## 2024-02-14 MED ORDER — SACUBITRIL-VALSARTAN 24-26 MG PO TABS
1.0000 | ORAL_TABLET | Freq: Two times a day (BID) | ORAL | 3 refills | Status: AC
  Filled 2024-02-15: qty 180, 90d supply, fill #0

## 2024-02-14 MED ORDER — ATORVASTATIN CALCIUM 10 MG PO TABS: 10.0000 mg | ORAL_TABLET | Freq: Every day | ORAL | 3 refills | Status: AC

## 2024-02-14 MED ORDER — APIXABAN 5 MG PO TABS: 5.0000 mg | ORAL_TABLET | Freq: Two times a day (BID) | ORAL | 3 refills | Status: AC

## 2024-02-14 NOTE — Addendum Note (Signed)
 Addended by: Sina Lucchesi D on: 02/14/2024 10:19 AM   Modules accepted: Orders

## 2024-02-14 NOTE — Progress Notes (Signed)
 Last read by Gaither VEAR Shams at 7:45AM on 02/14/2024.

## 2024-02-14 NOTE — Telephone Encounter (Signed)
 Patient states that he was suppose to start taking a new medication. But it is not listed. Please advise

## 2024-02-14 NOTE — Telephone Encounter (Signed)
 Medication refills sen in to pharmacy

## 2024-02-14 NOTE — Telephone Encounter (Signed)
*  STAT* If patient is at the pharmacy, call can be transferred to refill team.   1. Which medications need to be refilled? (please list name of each medication and dose if known)   apixaban  (ELIQUIS ) 5 MG TABS tablet    atorvastatin  (LIPITOR) 10 MG tablet   furosemide  (LASIX ) 40 MG tablet   spironolactone  (ALDACTONE ) 25 MG tablet   2. Which pharmacy/location (including street and city if local pharmacy) is medication to be sent to?  [facility] - [place], [place] - [address]    3. Do they need a 30 day or 90 day supply? 90

## 2024-02-15 ENCOUNTER — Other Ambulatory Visit: Payer: Self-pay

## 2024-02-15 ENCOUNTER — Telehealth: Payer: Self-pay | Admitting: Pharmacy Technician

## 2024-02-15 ENCOUNTER — Other Ambulatory Visit (HOSPITAL_COMMUNITY): Payer: Self-pay

## 2024-02-15 NOTE — Telephone Encounter (Signed)
   Pa cancelled this was for the generic and patient is getting brand for free

## 2024-02-16 NOTE — Telephone Encounter (Signed)
 They ended up approving the generic

## 2024-02-18 ENCOUNTER — Other Ambulatory Visit: Payer: Self-pay | Admitting: Emergency Medicine

## 2024-02-18 DIAGNOSIS — Z79899 Other long term (current) drug therapy: Secondary | ICD-10-CM

## 2024-02-19 LAB — BASIC METABOLIC PANEL WITH GFR
BUN/Creatinine Ratio: 8 — ABNORMAL LOW (ref 10–24)
BUN: 10 mg/dL (ref 8–27)
CO2: 30 mmol/L — ABNORMAL HIGH (ref 20–29)
Calcium: 9.4 mg/dL (ref 8.6–10.2)
Chloride: 91 mmol/L — ABNORMAL LOW (ref 96–106)
Creatinine, Ser: 1.19 mg/dL (ref 0.76–1.27)
Glucose: 88 mg/dL (ref 70–99)
Potassium: 3.1 mmol/L — ABNORMAL LOW (ref 3.5–5.2)
Sodium: 139 mmol/L (ref 134–144)
eGFR: 63 mL/min/1.73 (ref >59)

## 2024-02-21 MED ORDER — POTASSIUM CHLORIDE CRYS ER 20 MEQ PO TBCR: 20.0000 meq | EXTENDED_RELEASE_TABLET | Freq: Every day | ORAL | 3 refills | Status: DC

## 2024-02-29 ENCOUNTER — Other Ambulatory Visit
Admission: RE | Admit: 2024-02-29 | Discharge: 2024-02-29 | Disposition: A | Discharge status: Home / Self Care | Attending: Physician Assistant | Admitting: Physician Assistant

## 2024-02-29 ENCOUNTER — Ambulatory Visit: Payer: Self-pay | Admitting: Physician Assistant

## 2024-02-29 DIAGNOSIS — Z79899 Other long term (current) drug therapy: Secondary | ICD-10-CM | POA: Diagnosis not present

## 2024-02-29 LAB — BASIC METABOLIC PANEL WITH GFR
Anion gap: 9 (ref 5–15)
BUN: 8 mg/dL (ref 8–23)
CO2: 32 mmol/L (ref 22–32)
Calcium: 9 mg/dL (ref 8.9–10.3)
Chloride: 97 mmol/L — ABNORMAL LOW (ref 98–111)
Creatinine, Ser: 1.15 mg/dL (ref 0.61–1.24)
GFR, Estimated: >60 mL/min (ref >60)
Glucose, Bld: 100 mg/dL — ABNORMAL HIGH (ref 70–99)
Potassium: 3.4 mmol/L — ABNORMAL LOW (ref 3.5–5.1)
Sodium: 138 mmol/L (ref 135–145)

## 2024-02-29 NOTE — Telephone Encounter (Signed)
 Patient is returning phone call.

## 2024-02-29 NOTE — Telephone Encounter (Signed)
 Called and spoke with patient.  Pt will increase Potassium to 40 mEq and will have labs drawn 9/10 (BMP).  Pt wanted to know if he could reduce his Lasix  dose, currently he is taking Lasix  60 mg daily and he feels that he could be losing his potassium due to going to bathroom frequently.  Will send message to Lesley Maffucci, PA-C for further advise.

## 2024-02-29 NOTE — Addendum Note (Signed)
 Addended by: ALEX SLOUGH C on: 02/29/2024 10:18 AM   Modules accepted: Orders

## 2024-03-01 NOTE — Progress Notes (Signed)
 Last read by Gaither VEAR Shams at 10:58AM on 03/01/2024.

## 2024-03-10 ENCOUNTER — Telehealth: Payer: Self-pay | Admitting: Family

## 2024-03-10 NOTE — Progress Notes (Signed)
 [facility] Note   Referring Physician: admission PCP: Loreli Kins, MD Cardiologist: Deatrice Cage, MD   Chief Complaint: fatigue   HPI:  Terry Garcia is a 78 y/o male with a history of HTN, COPD, tobacco abuse, CHF, atrial fibrillation, CAD and ETOH abuse.    Admitted 01/14/24 with a fall in his home. Patient also reported gradual worsening of shortness of breath and lower extremity edema over the past several weeks. On admission, BNP was 401, HS-troponin was 25, and lactic acid was 1.4. Chest x-ray noted no active disease. CT head noted no acute abnormality. CTA noted negative for PE and positive for COPD and trace right pleural effusion. Echo 01/16/24: LVEF of 35-40%, grade I diastolic dysfunction and mildly reduced RV function. Initially IV diuresed. LHC 01/18/24 with 70% mid LAD, 100% S1 filled by RPDA collaterals, 50% mid Cx, 90% distal Cx, mild diffuse disease throughout RCA, 80% RPAV. RHC 01/18/24 showed RA of 10, PCWP of 24, CO 7.5, CI 3.6. TEE guided DCCV attempted unsuccessfully on 01/20/24.   He presents today, with his son, for a HF follow-up  visit with a chief complaint of fatigue (improving). Has associated occasional dizziness with sudden position changes, minimal ankle swelling. Chronic difficulty sleeping. Overall he says that he's feeling much better.   Smoking 1/2-1ppd cigarettes. No alcohol since 01/14/24. Wife has recently been diagnosed with colon cancer.   ROS: All systems negative except what is listed in HPI, PMH and Problem List   Past Medical History:  Diagnosis Date   Arterial atherosclerosis    per pt   COPD (chronic obstructive pulmonary disease) (HCC)    controlled with daily inhaler and albuterol  as needed   GERD (gastroesophageal reflux disease)    no meds needed   History of kidney stones    needed open abdominal surgery for stone in 1978, no stones since   Hypertension    amlodipine  and lisinopril/HCTZ   Right bundle branch  block    per pt, managed by primary, does not see cardiologist    Current Outpatient Medications  Medication Sig Dispense Refill   albuterol  (PROVENTIL  HFA;VENTOLIN  HFA) 108 (90 Base) MCG/ACT inhaler Inhale 1-2 puffs into the lungs every 6 (six) hours as needed for wheezing or shortness of breath.     amiodarone  (PACERONE ) 200 MG tablet Take 1 tablet (200 mg total) by mouth daily. 90 tablet 3   apixaban  (ELIQUIS ) 5 MG TABS tablet Take 1 tablet (5 mg total) by mouth 2 (two) times daily. 180 tablet 3   atorvastatin  (LIPITOR) 10 MG tablet Take 1 tablet (10 mg total) by mouth daily. 90 tablet 3   dapagliflozin  propanediol (FARXIGA ) 10 MG TABS tablet Take 1 tablet (10 mg total) by mouth daily. 90 tablet 3   folic acid  (FOLVITE ) 1 MG tablet Take 1 tablet (1 mg total) by mouth daily. 90 tablet 1   furosemide  (LASIX ) 40 MG tablet Take 1.5 tablets (60 mg total) by mouth daily. 135 tablet 3   Glycopyrrolate-Formoterol (BEVESPI AEROSPHERE) 9-4.8 MCG/ACT AERO Inhale 2 puffs into the lungs at bedtime.     metoprolol  succinate (TOPROL -XL) 25 MG 24 hr tablet Take 0.5 tablets (12.5 mg total) by mouth daily. Take with or immediately following a meal. 45 tablet 3   nicotine  (NICODERM CQ  - DOSED IN MG/24 HOURS) 14 mg/24hr patch Place 1 patch (14 mg total) onto the skin daily. 360 patch 0   nicotine  polacrilex (NICOTINE  MINI) 4 MG lozenge Take 1 lozenge (4  mg total) by mouth as needed. 100 tablet 0   potassium chloride  SA (KLOR-CON  M) 20 MEQ tablet Take 1 tablet (20 mEq total) by mouth daily. Take 40 mEq (2 tablets) for first 2 days, then 1 tablet daily. 90 tablet 3   sacubitril -valsartan  (ENTRESTO ) 24-26 MG Take 1 tablet by mouth 2 (two) times daily. 180 tablet 3   spironolactone  (ALDACTONE ) 25 MG tablet Take 1 tablet (25 mg total) by mouth daily. 30 tablet 5   thiamine  (VITAMIN B-1) 100 MG tablet Take 1 tablet (100 mg total) by mouth daily. 90 tablet 1   Current Facility-Administered Medications  Medication  Dose Route Frequency Provider Last Rate Last Admin   sacubitril -valsartan  (ENTRESTO ) 24-26 mg per tablet  1 tablet Oral BID Lorene Sinclair L, PA-C        Allergies  Allergen Reactions   Budeson-Glycopyrrol-Formoterol     Other Reaction(s): tongue swelling   Symbicort [Budesonide-Formoterol Fumarate]     Other Reaction(s): ineffective   Advair Diskus [Fluticasone-Salmeterol] Other (See Comments)    Pt states gave him the shakes   Levaquin [Levofloxacin] Other (See Comments)    Pt states made him feel extreme anger       Social History   Socioeconomic History   Marital status: Married    Spouse name: Nena   Number of children: 2   Years of education: Not on file   Highest education level: 12th grade  Occupational History   Occupation: Retired  Tobacco Use   Smoking status: Every Day    Current packs/day: 0.50    Types: Cigarettes   Smokeless tobacco: Never   Tobacco comments:    one pack lasts 41 hours  Substance and Sexual Activity   Alcohol use: Yes    Comment: one shot of liquor or beer daily   Drug use: Not Currently   Sexual activity: Not on file  Other Topics Concern   Not on file  Social History Narrative   Not on file   Social Drivers of Health   Financial Resource Strain: Not on file  Food Insecurity: No Food Insecurity (01/17/2024)   Hunger Vital Sign    Worried About Running Out of Food in the Last Year: Never true    Ran Out of Food in the Last Year: Never true  Transportation Needs: No Transportation Needs (01/17/2024)   PRAPARE - Administrator, Civil Service (Medical): No    Lack of Transportation (Non-Medical): No  Physical Activity: Not on file  Stress: Not on file  Social Connections: Moderately Isolated (01/15/2024)   Social Connection and Isolation Panel    Frequency of Communication with Friends and Family: Three times a week    Frequency of Social Gatherings with Friends and Family: Once a week    Attends Religious Services:  Never    Database administrator or Organizations: No    Attends Banker Meetings: Never    Marital Status: Living with partner  Intimate Partner Violence: Not At Risk (01/15/2024)   Humiliation, Afraid, Rape, and Kick questionnaire    Fear of Current or Ex-Partner: No    Emotionally Abused: No    Physically Abused: No    Sexually Abused: No      Family History  Problem Relation Age of Onset   Heart attack Father    Heart attack Sister    Vitals:   03/13/24 1010  BP: (!) 119/56  Pulse: (!) 49  SpO2: 99%  Weight: 181  lb (82.1 kg)   Wt Readings from Last 3 Encounters:  03/13/24 181 lb (82.1 kg)  02/11/24 177 lb 6.4 oz (80.5 kg)  02/03/24 183 lb 6.4 oz (83.2 kg)   Lab Results  Component Value Date   CREATININE 1.15 02/29/2024   CREATININE 1.19 02/18/2024   CREATININE 1.21 02/11/2024    PHYSICAL EXAM:  General: Well appearing.  Cor: No JVD. Regular rhythm, bradycardic.  Lungs: clear Abdomen: soft, nontender, nondistended. Extremities: trace edema bilateral lower legs. Bruising noted on bilateral arms Neuro:. Affect pleasant   ECG: not done   ASSESSMENT & PLAN:  1: NICM with reduced ejection fraction- - suspect due to AF/ alcohol use - NYHA Garcia II - euvolemic - weight down 10 pounds from last visit here 6 weeks ago - Echo 01/16/24: LVEF of 35-40%, grade I diastolic dysfunction and mildly reduced RV function - Echo scheduled for 04/14/24 - continue farxiga  10mg  daily - continue furosemide  60mg  daily / potassium 40meq daily - continue entresto  24/26mg  BID - continue metoprolol  succinate 12.5mg  daily - continue spironolactone  25mg  daily.  - BNP 01/14/24 was 401.0  2: HTN- - BP 119/56 - BMET 02/29/24 reviewed: sodium 138, potassium 3.4, creatinine 1.15 & GFR >60.  - Getting BMET rechecked today by cardiology  3: CAD- - LHC 01/18/24 with 70% mid LAD, 100% S1 filled by RPDA collaterals, 50% mid Cx, 90% distal Cx, mild diffuse disease throughout  RCA, 80% RPAV.  - RHC 01/18/24 showed RA of 10, PCWP of 24, CO 7.5, CI 3.6. - continue atorvastatin  10mg  daily - saw cardiology Concepcion) 08/25; returns 11/25  4: Alcohol use- - says that he hasn't had any alcohol since recent admission 07/25.  - congratulated on that and continued cessation discussed  5: Atrial fibrillation- - TEE guided DCCV attempted unsuccessfully on 01/20/24.  - continue amiodarone  200mg  daily - continue apixaban  5mg  BID - continue metoprolol  succinate 12.5mg  daily - will need yearly eye exam - TSH 02/11/24 was 3.9  6: Tobacco use- - smokes 1/2-1 pack of cigarettes daily - thinking about using nicotine  patches. Wife recently diagnosed with colon cancer - cessation encouraged   Return in 4 months, sooner if needed.   Terry DELENA Class, FNP 03/10/24

## 2024-03-10 NOTE — Telephone Encounter (Signed)
 Called to confirm/remind patient of their appointment at the [facility] on 03/10/24.   Appointment:   [] Confirmed  [x] Left mess   [] No answer/No voice mail  [] VM Full/unable to leave message  [] Phone not in service  Patient reminded to bring all medications and/or complete list.  Confirmed patient has transportation. Gave directions, instructed to utilize valet parking.

## 2024-03-13 ENCOUNTER — Ambulatory Visit: Payer: Self-pay | Attending: Family | Admitting: Family

## 2024-03-13 ENCOUNTER — Encounter: Payer: Self-pay | Admitting: Family

## 2024-03-13 VITALS — BP 119/56 | HR 49 | Wt 181.0 lb

## 2024-03-13 DIAGNOSIS — I5020 Unspecified systolic (congestive) heart failure: Secondary | ICD-10-CM | POA: Insufficient documentation

## 2024-03-13 DIAGNOSIS — I2510 Atherosclerotic heart disease of native coronary artery without angina pectoris: Secondary | ICD-10-CM | POA: Insufficient documentation

## 2024-03-13 DIAGNOSIS — Z7901 Long term (current) use of anticoagulants: Secondary | ICD-10-CM | POA: Diagnosis not present

## 2024-03-13 DIAGNOSIS — Z720 Tobacco use: Secondary | ICD-10-CM | POA: Diagnosis not present

## 2024-03-13 DIAGNOSIS — G479 Sleep disorder, unspecified: Secondary | ICD-10-CM | POA: Insufficient documentation

## 2024-03-13 DIAGNOSIS — F1090 Alcohol use, unspecified, uncomplicated: Secondary | ICD-10-CM | POA: Diagnosis not present

## 2024-03-13 DIAGNOSIS — J449 Chronic obstructive pulmonary disease, unspecified: Secondary | ICD-10-CM | POA: Insufficient documentation

## 2024-03-13 DIAGNOSIS — I110 Hypertensive heart disease with heart failure: Secondary | ICD-10-CM | POA: Insufficient documentation

## 2024-03-13 DIAGNOSIS — F17210 Nicotine dependence, cigarettes, uncomplicated: Secondary | ICD-10-CM | POA: Diagnosis not present

## 2024-03-13 DIAGNOSIS — Z79899 Other long term (current) drug therapy: Secondary | ICD-10-CM | POA: Insufficient documentation

## 2024-03-13 DIAGNOSIS — I4819 Other persistent atrial fibrillation: Secondary | ICD-10-CM

## 2024-03-13 DIAGNOSIS — I10 Essential (primary) hypertension: Secondary | ICD-10-CM | POA: Diagnosis not present

## 2024-03-13 DIAGNOSIS — I428 Other cardiomyopathies: Secondary | ICD-10-CM | POA: Diagnosis not present

## 2024-03-13 DIAGNOSIS — I4891 Unspecified atrial fibrillation: Secondary | ICD-10-CM | POA: Diagnosis not present

## 2024-03-13 DIAGNOSIS — R42 Dizziness and giddiness: Secondary | ICD-10-CM | POA: Insufficient documentation

## 2024-03-13 DIAGNOSIS — R5383 Other fatigue: Secondary | ICD-10-CM | POA: Diagnosis present

## 2024-03-13 MED ORDER — POTASSIUM CHLORIDE CRYS ER 20 MEQ PO TBCR: 40.0000 meq | EXTENDED_RELEASE_TABLET | Freq: Every day | ORAL | Status: AC

## 2024-03-13 NOTE — Patient Instructions (Signed)
 It was good to see you today!

## 2024-03-14 LAB — BASIC METABOLIC PANEL WITH GFR
BUN/Creatinine Ratio: 8 — ABNORMAL LOW (ref 10–24)
BUN: 9 mg/dL (ref 8–27)
CO2: 27 mmol/L (ref 20–29)
Calcium: 9.3 mg/dL (ref 8.6–10.2)
Chloride: 96 mmol/L (ref 96–106)
Creatinine, Ser: 1.18 mg/dL (ref 0.76–1.27)
Glucose: 94 mg/dL (ref 70–99)
Potassium: 4.4 mmol/L (ref 3.5–5.2)
Sodium: 137 mmol/L (ref 134–144)
eGFR: 64 mL/min/1.73 (ref >59)

## 2024-03-14 NOTE — Progress Notes (Signed)
 Last read by Gaither VEAR Shams at 9:45AM on 03/14/2024.

## 2024-04-03 ENCOUNTER — Ambulatory Visit (HOSPITAL_BASED_OUTPATIENT_CLINIC_OR_DEPARTMENT_OTHER): Admitting: Cardiology

## 2024-04-10 ENCOUNTER — Other Ambulatory Visit: Payer: Self-pay

## 2024-04-14 ENCOUNTER — Ambulatory Visit: Attending: Physician Assistant

## 2024-04-14 DIAGNOSIS — I5020 Unspecified systolic (congestive) heart failure: Secondary | ICD-10-CM | POA: Diagnosis not present

## 2024-04-14 LAB — ECHOCARDIOGRAM LIMITED
AR max vel: 2.38 cm2
AV Area VTI: 2.45 cm2
AV Area mean vel: 2.36 cm2
AV Mean grad: 3 mmHg
AV Peak grad: 6.4 mmHg
Ao pk vel: 1.26 m/s
Area-P 1/2: 3.08 cm2
S' Lateral: 4.7 cm

## 2024-05-13 ENCOUNTER — Other Ambulatory Visit: Payer: Self-pay

## 2024-05-18 NOTE — Progress Notes (Unsigned)
 Cardiology Office Note    Date:  05/19/2024   ID:  WORTH KOBER, DOB Jan 15, 1946, MRN [id number]  PCP:  [facility]   Cardiologist:  Deatrice Cage, MD  Electrophysiologist:  None   Chief Complaint: Follow up  History of Present Illness:   Terry Garcia is a 78 y.o. male with history of COPD, tobacco abuse, alcohol abuse, HFrEF, mixed ischemic and nonischemic cardiomyopathy, hypertension, RBBB, persistent atrial fibrillation, and aortic atherosclerosis who is being seen today for follow up on HFrEF and atrial fibrillation.     Patient initially reported to the [facility] ED 01/17/2024 after a fall at home. He subsequently reported several days of worsening dyspnea on exertion and palpitations. Had previously been seen by his PCP and diagnosed with heart failure and was waiting to be evaluated by cardiology. Found to be in atrial fibrillation with RVR. Echo was obtained and revealed reduced EF of 35 to 40% with global hypokinesis. Cardiology was asked to consult for further evaluation of heart failure and A-fib. He underwent right and left heart catheterization 7/15 which revealed multivessel CAD with heavy calcification, mildly elevated left heart filling pressures, and mildly to moderately elevated right heart filling pressures. Findings felt to be consistent with mixed ischemic and nonischemic cardiomyopathy. Given no symptoms of angina, medical management was recommended. He underwent TEE/DCCV on 7/16 and was cardioverted 4 times without success. He was started on oral amiodarone  load with recommendation for repeat cardioversion in 3 weeks time. GDMT was titrated and he was discharged on 7/17.   Patient was seen by advanced heart failure clinic 01/24/2024 and reported ongoing dyspnea.  He was noted to be in sinus bradycardia and metoprolol  succinate was subsequently reduced to 50 mg daily.  He was also noted to have weight gain and was asked to double his dose of  Lasix  for the following 2 days.  Patient was seen by myself 02/03/2024 overall doing well since hospital discharge.  He was noted to have significant bradycardia and metoprolol  succinate was reduced to 12.5 mg daily.  He remains somewhat volume overloaded on exam and Lasix  was increased to 60 mg daily.    Patient was most recently seen by myself 02/11/2024 overall doing well from a cardiac perspective.  He endorsed mild lower extremity swelling which was overall improved on increased dose of Lasix .  He felt as though he got a lot of his energy back with reduced dose of beta-blocker.  He reported home blood pressure readings in the 130s to 140s millimeters mercury.  He was transitioned from losartan  to Entresto .  He was seen by the advanced heart failure clinic 03/13/2024 and reported improving fatigue.  He reported occasional dizziness with sudden position changes, minimal ankle swelling, and chronic difficulty sleeping.  Continued to smoke 1/2 to 1 pack/day.  Had been abstinent from alcohol since hospital admission 01/2024.  No medication changes made at that time.  Patient presents today overall doing well from a cardiac perspective. Unfortunately, his wife was recently diagnosed with colon and pancreatic cancer leading him to take on many of the household duties. He is also personally caring for his wife as she has lost a significant amount of weight and strength. He has been tolerating this well physically without chest pain or dyspnea. They do have a home health aid who comes once per week but they do not feel she contributes much. Overall lower extremity swelling has been stable. He notices it more after long periods  on his feet. He denies palpitations, lightheadedness, and dizziness.   Labs independently reviewed: 03/13/2024-BUN 9, creatinine 1.18, sodium 137, potassium 4.4 01/20/2024- Hgb 13.7, HCT 40.8 01/17/2024- TC 128, TG 53, HDL 62, LDL 55  Objective   Past Medical History:  Diagnosis Date    Arterial atherosclerosis    per pt   COPD (chronic obstructive pulmonary disease) (HCC)    controlled with daily inhaler and albuterol  as needed   GERD (gastroesophageal reflux disease)    no meds needed   History of kidney stones    needed open abdominal surgery for stone in 1978, no stones since   Hypertension    amlodipine  and lisinopril/HCTZ   Right bundle branch block    per pt, managed by primary, does not see cardiologist    Current Medications: Current Meds  Medication Sig   albuterol  (PROVENTIL  HFA;VENTOLIN  HFA) 108 (90 Base) MCG/ACT inhaler Inhale 1-2 puffs into the lungs every 6 (six) hours as needed for wheezing or shortness of breath.   amiodarone  (PACERONE ) 200 MG tablet Take 1 tablet (200 mg total) by mouth daily.   apixaban  (ELIQUIS ) 5 MG TABS tablet Take 1 tablet (5 mg total) by mouth 2 (two) times daily.   atorvastatin  (LIPITOR) 10 MG tablet Take 1 tablet (10 mg total) by mouth daily.   dapagliflozin  propanediol (FARXIGA ) 10 MG TABS tablet Take 1 tablet (10 mg total) by mouth daily.   folic acid  (FOLVITE ) 1 MG tablet Take 1 tablet (1 mg total) by mouth daily.   furosemide  (LASIX ) 40 MG tablet Take 1.5 tablets (60 mg total) by mouth daily.   Glycopyrrolate-Formoterol (BEVESPI AEROSPHERE) 9-4.8 MCG/ACT AERO Inhale 2 puffs into the lungs at bedtime.   metoprolol  succinate (TOPROL -XL) 25 MG 24 hr tablet Take 0.5 tablets (12.5 mg total) by mouth daily. Take with or immediately following a meal.   nicotine  (NICODERM CQ  - DOSED IN MG/24 HOURS) 14 mg/24hr patch Place 1 patch (14 mg total) onto the skin daily.   nicotine  polacrilex (NICOTINE  MINI) 4 MG lozenge Take 1 lozenge (4 mg total) by mouth as needed.   sacubitril -valsartan  (ENTRESTO ) 24-26 MG Take 1 tablet by mouth 2 (two) times daily.   spironolactone  (ALDACTONE ) 25 MG tablet Take 1 tablet (25 mg total) by mouth daily.   thiamine  (VITAMIN B-1) 100 MG tablet Take 1 tablet (100 mg total) by mouth daily.   Current  Facility-Administered Medications for the 05/19/24 encounter (Office Visit) with Lorene Lesley CROME, PA-C  Medication   sacubitril -valsartan  (ENTRESTO ) 24-26 mg per tablet    Allergies:   Budeson-glycopyrrol-formoterol, Symbicort [budesonide-formoterol fumarate], Advair diskus [fluticasone-salmeterol], and Levaquin [levofloxacin]   Social History   Socioeconomic History   Marital status: Married    Spouse name: Nena   Number of children: 2   Years of education: Not on file   Highest education level: 12th grade  Occupational History   Occupation: Retired  Tobacco Use   Smoking status: Every Day    Current packs/day: 0.50    Types: Cigarettes   Smokeless tobacco: Never   Tobacco comments:    one pack lasts 41 hours  Substance and Sexual Activity   Alcohol use: Yes    Comment: one shot of liquor or beer daily   Drug use: Not Currently   Sexual activity: Not on file  Other Topics Concern   Not on file  Social History Narrative   Not on file   Social Drivers of Health   Financial Resource Strain: Not on  file  Food Insecurity: No Food Insecurity (01/17/2024)   Hunger Vital Sign    Worried About Running Out of Food in the Last Year: Never true    Ran Out of Food in the Last Year: Never true  Transportation Needs: No Transportation Needs (01/17/2024)   PRAPARE - Administrator, Civil Service (Medical): No    Lack of Transportation (Non-Medical): No  Physical Activity: Not on file  Stress: Not on file  Social Connections: Moderately Isolated (01/15/2024)   Social Connection and Isolation Panel    Frequency of Communication with Friends and Family: Three times a week    Frequency of Social Gatherings with Friends and Family: Once a week    Attends Religious Services: Never    Database Administrator or Organizations: No    Attends Engineer, Structural: Never    Marital Status: Living with partner     Family History:  The patient's family history  includes Heart attack in his father and sister.  ROS:   12-point review of systems is negative unless otherwise noted in the HPI.  EKGs/Other Studies Reviewed:    Studies reviewed were summarized above. The additional studies were reviewed today:  04/14/2024 Echo limited 1. Left ventricular ejection fraction, by estimation, is 40 to 45%. Left  ventricular ejection fraction by 3D volume is 46 %. The left ventricle has  mildly decreased function. The left ventricular internal cavity size was  mildly dilated. Left ventricular   diastolic parameters are consistent with Grade I diastolic dysfunction  (impaired relaxation). The average left ventricular global longitudinal  strain is -14.6 %. The global longitudinal strain is abnormal.   2. Right ventricular systolic function is normal. The right ventricular  size is normal.   3. The inferior vena cava is normal in size with greater than 50%  respiratory variability, suggesting right atrial pressure of 3 mmHg.   01/18/2024 LHC Multivessel coronary artery disease with heavy calcification, as detailed below, including 70% mid LAD stenosis, chronically occluded first septal branch, sequential 50% & 90% LCx lesions, and tandem 50% RCA stenoses.  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 Mildly elevated left heart filling pressures. Mildly-moderately elevated right heart filling pressures. Normal Fick cardiac output/index.   01/16/2024 Echo complete 1. Left ventricular ejection fraction, by estimation, is 35 to 40%. The left ventricle has moderately decreased function. The left ventricle demonstrates global hypokinesis. Left ventricular diastolic parameters are consistent with Grade I diastolic dysfunction (impaired relaxation).   2. Right ventricular systolic function is mildly reduced. The right ventricular size is mildly enlarged. Mildly increased right ventricular wall thickness.   3. Left atrial size was mildly dilated.    4. Right atrial size was moderately dilated.   5. The mitral valve is normal in structure. No evidence of mitral valve regurgitation.   6. The aortic valve is normal in structure. Aortic valve regurgitation is not visualized  EKG:  EKG personally reviewed by me today EKG Interpretation Date/Time:  Friday May 19 2024 11:07:11 EST Ventricular Rate:  66 PR Interval:  232 QRS Duration:  176 QT Interval:  516 QTC Calculation: 540 R Axis:   -76  Text Interpretation: Sinus rhythm with 1st degree A-V block Right bundle branch block Left anterior fascicular block Bifascicular block Confirmed by Lorene Sinclair ([id number]) on 05/19/2024 11:10:19 AM  PHYSICAL EXAM:    VS:  BP 115/64 (BP Location: Left Arm, Patient Position: Sitting, Cuff Size: Normal)   Pulse 66  Ht 5' 11 (1.803 m)   Wt 186 lb 6.4 oz (84.6 kg)   SpO2 93%   BMI 26.00 kg/m   BMI: Body mass index is 26 kg/m.  GEN: Well nourished, well developed in no acute distress NECK: No JVD; No carotid bruits CARDIAC: RRR, no murmurs, rubs, gallops RESPIRATORY:  Clear to auscultation without rales, wheezing or rhonchi  ABDOMEN: Soft, non-tender, non-distended EXTREMITIES: No edema; No deformity  Wt Readings from Last 3 Encounters:  05/19/24 186 lb 6.4 oz (84.6 kg)  03/13/24 181 lb (82.1 kg)  02/11/24 177 lb 6.4 oz (80.5 kg)                  ASSESSMENT & PLAN:   HFrEF Mixed ischemic and nonischemic cardiomyopathy - Echo 01/16/2024 with EF 35 to 40%, presumably mixed ischemic and nonischemic including tachycardia and alcohol mediated.  Updated echo 04/14/2024 with EF improved 40 to 45%.  Appears euvolemic and well compensated on exam today.  Continue Entresto  24-26 mg twice daily, metoprolol  succinate 12.5 mg daily, Farxiga  10 mg daily, and spironolactone  25 mg daily.  Persistent atrial fibrillation - No symptoms concerning for recurrence of atrial fibrillation.  He is maintaining sinus rhythm on EKG today.  He is continued on  metoprolol  succinate 12.5 mg daily, Eliquis  5 mg twice daily, and amiodarone  200 mg daily.  Denies bleeding and hematochezia.  Coronary artery disease - LHC 01/2024 with multivessel CAD with heavy calcification, detailed above.  Medical management recommended.  No symptoms concerning for angina.  He is continued on atorvastatin  10 mg daily.  No aspirin  in the setting of long-term DOAC.  Hypertension - Blood pressure well-controlled on current regimen of Entresto , metoprolol , and spironolactone .  Alcohol abuse Tobacco abuse - No alcohol use since hospital discharge 01/2024.  Ongoing tobacco use of 1/2 to 1 pack/day.  Discussed importance and recommendation of complete cessation.   Disposition: F/u with Dr. Darron or an APP in 5 to 6 months.   Medication Adjustments/Labs and Tests Ordered: Current medicines are reviewed at length with the patient today.  Concerns regarding medicines are outlined above. Medication changes, Labs and Tests ordered today are summarized above and listed in the Patient Instructions accessible in Encounters.   Bonney Lesley Maffucci, PA-C 05/19/2024 12:43 PM     [facility] [place] [address] [place], [place] [address] [phone]

## 2024-05-19 ENCOUNTER — Encounter: Payer: Self-pay | Admitting: Physician Assistant

## 2024-05-19 ENCOUNTER — Ambulatory Visit: Attending: Physician Assistant | Admitting: Physician Assistant

## 2024-05-19 ENCOUNTER — Ambulatory Visit: Admitting: Nurse Practitioner

## 2024-05-19 VITALS — BP 115/64 | HR 66 | Ht 71.0 in | Wt 186.4 lb

## 2024-05-19 DIAGNOSIS — Z79899 Other long term (current) drug therapy: Secondary | ICD-10-CM | POA: Diagnosis not present

## 2024-05-19 DIAGNOSIS — Z720 Tobacco use: Secondary | ICD-10-CM

## 2024-05-19 DIAGNOSIS — F1090 Alcohol use, unspecified, uncomplicated: Secondary | ICD-10-CM

## 2024-05-19 DIAGNOSIS — I4819 Other persistent atrial fibrillation: Secondary | ICD-10-CM

## 2024-05-19 DIAGNOSIS — I10 Essential (primary) hypertension: Secondary | ICD-10-CM | POA: Diagnosis not present

## 2024-05-19 DIAGNOSIS — I5020 Unspecified systolic (congestive) heart failure: Secondary | ICD-10-CM | POA: Diagnosis not present

## 2024-05-19 DIAGNOSIS — I2510 Atherosclerotic heart disease of native coronary artery without angina pectoris: Secondary | ICD-10-CM | POA: Diagnosis not present

## 2024-05-19 NOTE — Patient Instructions (Signed)
 Medication Instructions:  Your physician recommends that you continue on your current medications as directed. Please refer to the Current Medication list given to you today.  *If you need a refill on your cardiac medications before your next appointment, please call your pharmacy*  Lab Work: No labs ordered today  If you have labs (blood work) drawn today and your tests are completely normal, you will receive your results only by: MyChart Message (if you have MyChart) OR A paper copy in the mail If you have any lab test that is abnormal or we need to change your treatment, we will call you to review the results.  Testing/Procedures: No test ordered today   Follow-Up: At [facility], you and your health needs are our priority.  As part of our continuing mission to provide you with exceptional heart care, our providers are all part of one team.  This team includes your primary Cardiologist (physician) and Advanced Practice Providers or APPs (Physician Assistants and Nurse Practitioners) who all work together to provide you with the care you need, when you need it.  Your next appointment:   5 month(s)  Provider:   You will see one of the following Advanced Practice Providers on your designated Care Team:   Lonni Meager, NP Lesley Maffucci, PA-C Bernardino Bring, PA-C Cadence [place], PA-C Tylene Lunch, NP Barnie Hila, NP

## 2024-07-07 ENCOUNTER — Telehealth: Payer: Self-pay | Admitting: Family

## 2024-07-07 NOTE — Telephone Encounter (Signed)
 Called to confirm/remind patient of their appointment at the Advanced Heart Failure Clinic on 07/10/24.   Appointment:   [x] Confirmed  [] Left mess   [] No answer/No voice mail  [] VM Full/unable to leave message  [] Phone not in service  Patient reminded to bring all medications and/or complete list.  Confirmed patient has transportation. Gave directions, instructed to utilize valet parking.

## 2024-07-07 NOTE — Progress Notes (Deleted)
 "  Advanced Heart Failure Clinic Note   Referring Physician: admission PCP: Loreli Kins, MD Cardiologist: Deatrice Cage, MD   Chief Complaint:    HPI:  Terry Garcia is a 79 y/o male with a history of HTN, COPD, tobacco abuse, CHF, atrial fibrillation, CAD and ETOH abuse.    Admitted 01/14/24 with a fall in his home. Patient also reported gradual worsening of shortness of breath and lower extremity edema over the past several weeks. On admission, BNP was 401, HS-troponin was 25, and lactic acid was 1.4. Chest x-ray noted no active disease. CT head noted no acute abnormality. CTA noted negative for PE and positive for COPD and trace right pleural effusion. Echo 01/16/24: LVEF of 35-40%, grade I diastolic dysfunction and mildly reduced RV function. Initially IV diuresed. LHC 01/18/24 with 70% mid LAD, 100% S1 filled by RPDA collaterals, 50% mid Cx, 90% distal Cx, mild diffuse disease throughout RCA, 80% RPAV. RHC 01/18/24 showed RA of 10, PCWP of 24, CO 7.5, CI 3.6. TEE guided DCCV attempted unsuccessfully on 01/20/24.   Echo 04/14/24: EF 40-45%, G1DD, normal RV  He presents today, with his son, for a HF follow-up  visit with a chief complaint of   Smoking 1/2-1ppd cigarettes. No alcohol since 01/14/24. Wife has recently been diagnosed with colon cancer.   ROS: All systems negative except what is listed in HPI, PMH and Problem List   Past Medical History:  Diagnosis Date   Arterial atherosclerosis    per pt   COPD (chronic obstructive pulmonary disease) (HCC)    controlled with daily inhaler and albuterol  as needed   GERD (gastroesophageal reflux disease)    no meds needed   History of kidney stones    needed open abdominal surgery for stone in 1978, no stones since   Hypertension    amlodipine  and lisinopril/HCTZ   Right bundle branch block    per pt, managed by primary, does not see cardiologist    Current Outpatient Medications  Medication Sig Dispense Refill   albuterol   (PROVENTIL  HFA;VENTOLIN  HFA) 108 (90 Base) MCG/ACT inhaler Inhale 1-2 puffs into the lungs every 6 (six) hours as needed for wheezing or shortness of breath.     amiodarone  (PACERONE ) 200 MG tablet Take 1 tablet (200 mg total) by mouth daily. 90 tablet 3   apixaban  (ELIQUIS ) 5 MG TABS tablet Take 1 tablet (5 mg total) by mouth 2 (two) times daily. 180 tablet 3   atorvastatin  (LIPITOR) 10 MG tablet Take 1 tablet (10 mg total) by mouth daily. 90 tablet 3   dapagliflozin  propanediol (FARXIGA ) 10 MG TABS tablet Take 1 tablet (10 mg total) by mouth daily. 90 tablet 3   folic acid  (FOLVITE ) 1 MG tablet Take 1 tablet (1 mg total) by mouth daily. 90 tablet 1   furosemide  (LASIX ) 40 MG tablet Take 1.5 tablets (60 mg total) by mouth daily. 135 tablet 3   Glycopyrrolate-Formoterol (BEVESPI AEROSPHERE) 9-4.8 MCG/ACT AERO Inhale 2 puffs into the lungs at bedtime.     metoprolol  succinate (TOPROL -XL) 25 MG 24 hr tablet Take 0.5 tablets (12.5 mg total) by mouth daily. Take with or immediately following a meal. 45 tablet 3   nicotine  (NICODERM CQ  - DOSED IN MG/24 HOURS) 14 mg/24hr patch Place 1 patch (14 mg total) onto the skin daily. 360 patch 0   nicotine  polacrilex (NICOTINE  MINI) 4 MG lozenge Take 1 lozenge (4 mg total) by mouth as needed. 100 tablet 0   potassium chloride  SA (  KLOR-CON  M) 20 MEQ tablet Take 2 tablets (40 mEq total) by mouth daily.     sacubitril -valsartan  (ENTRESTO ) 24-26 MG Take 1 tablet by mouth 2 (two) times daily. 180 tablet 3   spironolactone  (ALDACTONE ) 25 MG tablet Take 1 tablet (25 mg total) by mouth daily. 30 tablet 5   thiamine  (VITAMIN B-1) 100 MG tablet Take 1 tablet (100 mg total) by mouth daily. 90 tablet 1   Current Facility-Administered Medications  Medication Dose Route Frequency Provider Last Rate Last Admin   sacubitril -valsartan  (ENTRESTO ) 24-26 mg per tablet  1 tablet Oral BID Lorene Sinclair L, PA-C        Allergies  Allergen Reactions   Budeson-Glycopyrrol-Formoterol      Other Reaction(s): tongue swelling   Symbicort [Budesonide-Formoterol Fumarate]     Other Reaction(s): ineffective   Advair Diskus [Fluticasone-Salmeterol] Other (See Comments)    Pt states gave him the shakes   Levaquin [Levofloxacin] Other (See Comments)    Pt states made him feel extreme anger       Social History   Socioeconomic History   Marital status: Married    Spouse name: Nena   Number of children: 2   Years of education: Not on file   Highest education level: 12th grade  Occupational History   Occupation: Retired  Tobacco Use   Smoking status: Every Day    Current packs/day: 0.50    Types: Cigarettes   Smokeless tobacco: Never   Tobacco comments:    one pack lasts 41 hours  Substance and Sexual Activity   Alcohol use: Yes    Comment: one shot of liquor or beer daily   Drug use: Not Currently   Sexual activity: Not on file  Other Topics Concern   Not on file  Social History Narrative   Not on file   Social Drivers of Health   Tobacco Use: High Risk (05/19/2024)   Patient History    Smoking Tobacco Use: Every Day    Smokeless Tobacco Use: Never    Passive Exposure: Not on file  Financial Resource Strain: Not on file  Food Insecurity: No Food Insecurity (01/17/2024)   Epic    Worried About Programme Researcher, Broadcasting/film/video in the Last Year: Never true    Ran Out of Food in the Last Year: Never true  Transportation Needs: No Transportation Needs (01/17/2024)   Epic    Lack of Transportation (Medical): No    Lack of Transportation (Non-Medical): No  Physical Activity: Not on file  Stress: Not on file  Social Connections: Moderately Isolated (01/15/2024)   Social Connection and Isolation Panel    Frequency of Communication with Friends and Family: Three times a week    Frequency of Social Gatherings with Friends and Family: Once a week    Attends Religious Services: Never    Database Administrator or Organizations: No    Attends Banker Meetings:  Never    Marital Status: Living with partner  Intimate Partner Violence: Not At Risk (01/15/2024)   Epic    Fear of Current or Ex-Partner: No    Emotionally Abused: No    Physically Abused: No    Sexually Abused: No  Depression (PHQ2-9): Not on file  Alcohol Screen: Not on file  Housing: Unknown (01/17/2024)   Epic    Unable to Pay for Housing in the Last Year: No    Number of Times Moved in the Last Year: Not on file    Homeless  in the Last Year: No  Utilities: Not At Risk (01/15/2024)   Epic    Threatened with loss of utilities: No  Health Literacy: Not on file      Family History  Problem Relation Age of Onset   Heart attack Father    Heart attack Sister       PHYSICAL EXAM:  General: Well appearing.  Cor: No JVD. Regular rhythm, bradycardic.  Lungs: clear Abdomen: soft, nontender, nondistended. Extremities: trace edema bilateral lower legs. Bruising noted on bilateral arms Neuro:. Affect pleasant   ECG 05/19/24: NSR 1st degree AV block   ASSESSMENT & PLAN:  1: NICM with reduced ejection fraction- - suspect due to AF/ alcohol use - NYHA class II - euvolemic - weight 181 pounds from last visit here 4 months ago - Echo 01/16/24: LVEF of 35-40%, grade I diastolic dysfunction and mildly reduced RV function - Echo 04/14/24: EF 40-45%, G1DD, normal RV - continue farxiga  10mg  daily - continue furosemide  60mg  daily / potassium 40meq daily - continue entresto  24/26mg  BID - continue metoprolol  succinate 12.5mg  daily - continue spironolactone  25mg  daily.  - BNP 01/14/24 was 401.0  2: HTN- - BP  - BMET 03/13/24 reviewed: sodium 137, potassium 4.4, creatinine 1.18 & GFR 64.   3: CAD- - LHC 01/18/24 with 70% mid LAD, 100% S1 filled by RPDA collaterals, 50% mid Cx, 90% distal Cx, mild diffuse disease throughout RCA, 80% RPAV.  - RHC 01/18/24 showed RA of 10, PCWP of 24, CO 7.5, CI 3.6. - continue atorvastatin  10mg  daily - saw cardiology Concepcion) 11/25  4: Alcohol  use- - says that he hasn't had any alcohol since recent admission 07/25.  - congratulated on that and continued cessation discussed  5: Atrial fibrillation- - TEE guided DCCV attempted unsuccessfully on 01/20/24.  - continue amiodarone  200mg  daily - continue apixaban  5mg  BID - continue metoprolol  succinate 12.5mg  daily - will need yearly eye exam - TSH 02/11/24 was 3.9  6: Tobacco use- - smokes 1/2-1 pack of cigarettes daily - thinking about using nicotine  patches. Wife recently diagnosed with colon cancer - cessation encouraged     Ellouise DELENA Class, FNP 07/09/2024 "

## 2024-07-10 ENCOUNTER — Emergency Department

## 2024-07-10 ENCOUNTER — Encounter: Admitting: Family

## 2024-07-10 ENCOUNTER — Encounter: Payer: Self-pay | Admitting: Internal Medicine

## 2024-07-10 ENCOUNTER — Other Ambulatory Visit: Payer: Self-pay

## 2024-07-10 ENCOUNTER — Observation Stay
Admission: EM | Admit: 2024-07-10 | Discharge: 2024-07-12 | Disposition: A | Discharge status: Home / Self Care | Attending: Internal Medicine | Admitting: Internal Medicine

## 2024-07-10 DIAGNOSIS — F17210 Nicotine dependence, cigarettes, uncomplicated: Secondary | ICD-10-CM | POA: Diagnosis not present

## 2024-07-10 DIAGNOSIS — I2510 Atherosclerotic heart disease of native coronary artery without angina pectoris: Secondary | ICD-10-CM | POA: Insufficient documentation

## 2024-07-10 DIAGNOSIS — I5022 Chronic systolic (congestive) heart failure: Secondary | ICD-10-CM | POA: Insufficient documentation

## 2024-07-10 DIAGNOSIS — M549 Dorsalgia, unspecified: Secondary | ICD-10-CM | POA: Diagnosis present

## 2024-07-10 DIAGNOSIS — J449 Chronic obstructive pulmonary disease, unspecified: Secondary | ICD-10-CM | POA: Diagnosis not present

## 2024-07-10 DIAGNOSIS — Z79899 Other long term (current) drug therapy: Secondary | ICD-10-CM | POA: Insufficient documentation

## 2024-07-10 DIAGNOSIS — I110 Hypertensive heart disease with heart failure: Secondary | ICD-10-CM | POA: Diagnosis not present

## 2024-07-10 DIAGNOSIS — I4819 Other persistent atrial fibrillation: Secondary | ICD-10-CM | POA: Diagnosis present

## 2024-07-10 DIAGNOSIS — I5020 Unspecified systolic (congestive) heart failure: Secondary | ICD-10-CM

## 2024-07-10 DIAGNOSIS — I482 Chronic atrial fibrillation, unspecified: Secondary | ICD-10-CM | POA: Insufficient documentation

## 2024-07-10 DIAGNOSIS — E785 Hyperlipidemia, unspecified: Secondary | ICD-10-CM | POA: Insufficient documentation

## 2024-07-10 DIAGNOSIS — N10 Acute tubulo-interstitial nephritis: Principal | ICD-10-CM | POA: Insufficient documentation

## 2024-07-10 DIAGNOSIS — R1030 Lower abdominal pain, unspecified: Secondary | ICD-10-CM

## 2024-07-10 DIAGNOSIS — K5900 Constipation, unspecified: Secondary | ICD-10-CM | POA: Insufficient documentation

## 2024-07-10 DIAGNOSIS — I25118 Atherosclerotic heart disease of native coronary artery with other forms of angina pectoris: Secondary | ICD-10-CM | POA: Diagnosis present

## 2024-07-10 DIAGNOSIS — Z7901 Long term (current) use of anticoagulants: Secondary | ICD-10-CM | POA: Insufficient documentation

## 2024-07-10 DIAGNOSIS — I509 Heart failure, unspecified: Secondary | ICD-10-CM

## 2024-07-10 DIAGNOSIS — N12 Tubulo-interstitial nephritis, not specified as acute or chronic: Principal | ICD-10-CM

## 2024-07-10 LAB — CBC
HCT: 50.4 % (ref 39.0–52.0)
Hemoglobin: 16.7 g/dL (ref 13.0–17.0)
MCH: 29.7 pg (ref 26.0–34.0)
MCHC: 33.1 g/dL (ref 30.0–36.0)
MCV: 89.7 fL (ref 80.0–100.0)
Platelets: 175 K/uL (ref 150–400)
RBC: 5.62 MIL/uL (ref 4.22–5.81)
RDW: 13.5 % (ref 11.5–15.5)
WBC: 9.7 K/uL (ref 4.0–10.5)
nRBC: 0 % (ref 0.0–0.2)

## 2024-07-10 LAB — URINALYSIS, ROUTINE W REFLEX MICROSCOPIC
Bacteria, UA: NONE SEEN
Bilirubin Urine: NEGATIVE
Glucose, UA: >=500 mg/dL — AB
Hgb urine dipstick: NEGATIVE
Ketones, ur: NEGATIVE mg/dL
Leukocytes,Ua: NEGATIVE
Nitrite: NEGATIVE
Protein, ur: 100 mg/dL — AB
Specific Gravity, Urine: 1.012 (ref 1.005–1.030)
pH: 6 (ref 5.0–8.0)

## 2024-07-10 LAB — COMPREHENSIVE METABOLIC PANEL WITH GFR
ALT: 17 U/L (ref 0–44)
AST: 29 U/L (ref 15–41)
Albumin: 4.9 g/dL (ref 3.5–5.0)
Alkaline Phosphatase: 126 U/L (ref 38–126)
Anion gap: 12 (ref 5–15)
BUN: 15 mg/dL (ref 8–23)
CO2: 31 mmol/L (ref 22–32)
Calcium: 10.1 mg/dL (ref 8.9–10.3)
Chloride: 95 mmol/L — ABNORMAL LOW (ref 98–111)
Creatinine, Ser: 1.27 mg/dL — ABNORMAL HIGH (ref 0.61–1.24)
GFR, Estimated: 59 mL/min — ABNORMAL LOW
Glucose, Bld: 99 mg/dL (ref 70–99)
Potassium: 4.2 mmol/L (ref 3.5–5.1)
Sodium: 137 mmol/L (ref 135–145)
Total Bilirubin: 1 mg/dL (ref 0.0–1.2)
Total Protein: 8.3 g/dL — ABNORMAL HIGH (ref 6.5–8.1)

## 2024-07-10 LAB — CBG MONITORING, ED: Glucose-Capillary: 104 mg/dL — ABNORMAL HIGH (ref 70–99)

## 2024-07-10 LAB — HEMOGLOBIN A1C
Hgb A1c MFr Bld: 5.4 % (ref 4.8–5.6)
Mean Plasma Glucose: 108.28 mg/dL

## 2024-07-10 LAB — LIPASE, BLOOD: Lipase: 21 U/L (ref 11–51)

## 2024-07-10 MED ORDER — FUROSEMIDE 40 MG PO TABS
60.0000 mg | ORAL_TABLET | Freq: Every day | ORAL | Status: DC
  Administered 2024-07-10 – 2024-07-12 (×3): 60 mg via ORAL
  Filled 2024-07-10: qty 2
  Filled 2024-07-10 (×2): qty 1

## 2024-07-10 MED ORDER — HYDROMORPHONE HCL 1 MG/ML IJ SOLN
0.5000 mg | INTRAMUSCULAR | Status: AC | PRN
  Administered 2024-07-10: 0.5 mg via INTRAVENOUS
  Filled 2024-07-10: qty 0.5

## 2024-07-10 MED ORDER — SODIUM CHLORIDE 0.9 % IV BOLUS (SEPSIS): 1000.0000 mL | Freq: Once | INTRAVENOUS | Status: DC

## 2024-07-10 MED ORDER — INSULIN ASPART 100 UNIT/ML IJ SOLN: 0.0000 [IU] | Freq: Every day | INTRAMUSCULAR | Status: DC

## 2024-07-10 MED ORDER — ONDANSETRON HCL 4 MG/2ML IJ SOLN: 4.0000 mg | Freq: Three times a day (TID) | INTRAMUSCULAR | Status: AC | PRN

## 2024-07-10 MED ORDER — INSULIN ASPART 100 UNIT/ML IJ SOLN: 0.0000 [IU] | Freq: Three times a day (TID) | INTRAMUSCULAR | Status: DC

## 2024-07-10 MED ORDER — APIXABAN 5 MG PO TABS
5.0000 mg | ORAL_TABLET | Freq: Two times a day (BID) | ORAL | Status: DC
  Administered 2024-07-10 – 2024-07-12 (×5): 5 mg via ORAL
  Filled 2024-07-10 (×5): qty 1

## 2024-07-10 MED ORDER — SPIRONOLACTONE 25 MG PO TABS
25.0000 mg | ORAL_TABLET | Freq: Every day | ORAL | Status: DC
  Administered 2024-07-10 – 2024-07-12 (×3): 25 mg via ORAL
  Filled 2024-07-10 (×3): qty 1

## 2024-07-10 MED ORDER — LACTULOSE 10 GM/15ML PO SOLN
10.0000 g | Freq: Three times a day (TID) | ORAL | Status: DC
  Administered 2024-07-10 – 2024-07-12 (×3): 10 g via ORAL
  Filled 2024-07-10 (×4): qty 30

## 2024-07-10 MED ORDER — POLYETHYLENE GLYCOL 3350 17 G PO PACK
17.0000 g | PACK | Freq: Every day | ORAL | Status: DC
  Administered 2024-07-10 – 2024-07-12 (×2): 17 g via ORAL
  Filled 2024-07-10 (×2): qty 1

## 2024-07-10 MED ORDER — AMIODARONE HCL 200 MG PO TABS
200.0000 mg | ORAL_TABLET | Freq: Every day | ORAL | Status: DC
  Administered 2024-07-10 – 2024-07-12 (×3): 200 mg via ORAL
  Filled 2024-07-10 (×3): qty 1

## 2024-07-10 MED ORDER — SODIUM CHLORIDE 0.9 % IV BOLUS (SEPSIS)
500.0000 mL | Freq: Once | INTRAVENOUS | Status: AC
  Administered 2024-07-10: 500 mL via INTRAVENOUS

## 2024-07-10 MED ORDER — ALBUTEROL SULFATE (2.5 MG/3ML) 0.083% IN NEBU: 2.5000 mg | INHALATION_SOLUTION | RESPIRATORY_TRACT | Status: AC | PRN

## 2024-07-10 MED ORDER — SODIUM CHLORIDE 0.9 % IV SOLN
2.0000 g | Freq: Once | INTRAVENOUS | Status: AC
  Administered 2024-07-10: 2 g via INTRAVENOUS
  Filled 2024-07-10: qty 20

## 2024-07-10 MED ORDER — IOHEXOL 300 MG/ML  SOLN
100.0000 mL | Freq: Once | INTRAMUSCULAR | Status: AC | PRN
  Administered 2024-07-10: 100 mL via INTRAVENOUS

## 2024-07-10 MED ORDER — SACUBITRIL-VALSARTAN 24-26 MG PO TABS
1.0000 | ORAL_TABLET | Freq: Two times a day (BID) | ORAL | Status: DC
  Administered 2024-07-10 – 2024-07-12 (×5): 1 via ORAL
  Filled 2024-07-10 (×6): qty 1

## 2024-07-10 MED ORDER — SMOG ENEMA: 960.0000 mL | Freq: Once | RECTAL | Status: DC

## 2024-07-10 MED ORDER — METOPROLOL SUCCINATE ER 25 MG PO TB24
12.5000 mg | ORAL_TABLET | Freq: Every day | ORAL | Status: DC
  Administered 2024-07-10 – 2024-07-12 (×3): 12.5 mg via ORAL
  Filled 2024-07-10 (×3): qty 1

## 2024-07-10 MED ORDER — SODIUM CHLORIDE 0.9 % IV SOLN
2.0000 g | INTRAVENOUS | Status: DC
  Administered 2024-07-11 – 2024-07-12 (×2): 2 g via INTRAVENOUS
  Filled 2024-07-10 (×2): qty 20

## 2024-07-10 MED ORDER — ATORVASTATIN CALCIUM 10 MG PO TABS
10.0000 mg | ORAL_TABLET | Freq: Every day | ORAL | Status: DC
  Administered 2024-07-10 – 2024-07-12 (×3): 10 mg via ORAL
  Filled 2024-07-10 (×3): qty 1

## 2024-07-10 NOTE — ED Notes (Signed)
 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 His wife is being transferred to hospice today.   Pt hurt his back while trying to catch his wife who fell Christmas Day. Pt has also been unable to have a bowel movement for several days. Has tried several stool softeners and prune juice.

## 2024-07-10 NOTE — H&P (Signed)
 "  History and Physical    Terry Garcia FMW:[id number] DOB: 06-24-1946 DOA: 07/10/2024  DOS: the patient was seen and examined on 07/10/2024  PCP: [facility]    Patient coming from: Home  I have personally briefly reviewed patient's old medical records in [facility]  Chief Complaint: Back pain and constipation 4 to 5 days  HPI: Terry Garcia is a pleasant 79 y.o. male with medical history significant for A-fib on Eliquis , HTN, COPD not on home oxygen, CHF, kidney stones, GERD who came into ED complaining of lower abdominal pain which is intermittent, severe in nature, 8/10 in intensity, with no bowel movement for 4 to 5 days.  He also stated that he did not pass gas.  He stated that he tried prune juice, Dulcolax, Ex-Lax at home without relief.  He also stated that his pain became so severe he also fell without any injury at home.  He denies any pain in his head. He denied any nausea, vomiting, fever, chest pain, cough, shortness of breath, hematuria, hematemesis, melena.  He said he abdominal surgery for kidney stones, umbilical hernia repair.  He denies prior history of bowel obstruction.    ED Course: Upon arrival to the ED, patient is found to have decreased perfusion in left kidney could be related to pyelonephritis.  Urine analysis was mildly positive for UTI.  Patient was given IV fluid, pain medication, IV antibiotic ceftriaxone .  Blood cultures were sent.  Hospitalist service was consulted for evaluation for admission for possible acute pyelonephritis.  Review of Systems:  ROS  All other systems negative except as noted in the HPI.  Past Medical History:  Diagnosis Date   Arterial atherosclerosis    per pt   COPD (chronic obstructive pulmonary disease) (HCC)    controlled with daily inhaler and albuterol  as needed   GERD (gastroesophageal reflux disease)    no meds needed   History of kidney stones    needed open abdominal surgery for  stone in 1978, no stones since   Hypertension    amlodipine  and lisinopril/HCTZ   Right bundle branch block    per pt, managed by primary, does not see cardiologist    Past Surgical History:  Procedure Laterality Date   CARDIOVERSION N/A 01/19/2024   Procedure: CARDIOVERSION;  Surgeon: Perla Evalene PARAS, MD;  Location: [facility];  Service: Cardiovascular;  Laterality: N/A;   CYST REMOVAL LEG  1980   removal of cyst on knee cap    INSERTION OF MESH N/A 07/13/2018   Procedure: INSERTION OF MESH;  Surgeon: Vanderbilt Ned, MD;  Location: [facility];  Service: General;  Laterality: N/A;   KIDNEY SURGERY  1978   needed open abdominal surgery to remove stone   PILONIDAL CYST EXCISION  1970   RIGHT/LEFT HEART CATH AND CORONARY ANGIOGRAPHY N/A 01/18/2024   Procedure: RIGHT/LEFT HEART CATH AND CORONARY ANGIOGRAPHY;  Surgeon: Mady Bruckner, MD;  Location: [facility];  Service: Cardiovascular;  Laterality: N/A;   TEE WITHOUT CARDIOVERSION N/A 01/19/2024   Procedure: ECHOCARDIOGRAM, TRANSESOPHAGEAL;  Surgeon: Perla Evalene PARAS, MD;  Location: [facility];  Service: Cardiovascular;  Laterality: N/A;   UMBILICAL HERNIA REPAIR N/A 07/13/2018   Procedure: UMBILICAL HERNIA REPAIR WITH MESH;  Surgeon: Vanderbilt Ned, MD;  Location: [facility];  Service: General;  Laterality: N/A;     reports that he has been smoking cigarettes. He has never used smokeless tobacco. He reports current  alcohol use. He reports that he does not currently use drugs.  Allergies[1]  Family History  Problem Relation Age of Onset   Heart attack Father    Heart attack Sister     Prior to Admission medications  Medication Sig Start Date End Date Taking? Authorizing Provider  albuterol  (PROVENTIL  HFA;VENTOLIN  HFA) 108 (90 Base) MCG/ACT inhaler Inhale 1-2 puffs into the lungs every 6 (six) hours as needed for wheezing or shortness of breath.    [provider]  amiodarone  (PACERONE )  200 MG tablet Take 1 tablet (200 mg total) by mouth daily. 02/03/24   Lorene Lesley CROME, PA-C  apixaban  (ELIQUIS ) 5 MG TABS tablet Take 1 tablet (5 mg total) by mouth 2 (two) times daily. 02/14/24 02/13/25  Lorene Lesley CROME, PA-C  atorvastatin  (LIPITOR) 10 MG tablet Take 1 tablet (10 mg total) by mouth daily. 02/14/24 02/13/25  Lorene Lesley CROME, PA-C  dapagliflozin  propanediol (FARXIGA ) 10 MG TABS tablet Take 1 tablet (10 mg total) by mouth daily. 02/14/24 02/13/25  Lorene Lesley CROME, PA-C  folic acid  (FOLVITE ) 1 MG tablet Take 1 tablet (1 mg total) by mouth daily. 01/21/24   Amin, Sumayya, MD  furosemide  (LASIX ) 40 MG tablet Take 1.5 tablets (60 mg total) by mouth daily. 02/03/24   Lorene Lesley CROME, PA-C  Glycopyrrolate-Formoterol (BEVESPI AEROSPHERE) 9-4.8 MCG/ACT AERO Inhale 2 puffs into the lungs at bedtime.    [provider]  metoprolol  succinate (TOPROL -XL) 25 MG 24 hr tablet Take 0.5 tablets (12.5 mg total) by mouth daily. Take with or immediately following a meal. 02/03/24 05/19/24  Lorene Lesley L, PA-C  nicotine  (NICODERM CQ  - DOSED IN MG/24 HOURS) 14 mg/24hr patch Place 1 patch (14 mg total) onto the skin daily. 01/14/24 01/13/25  Cyrena Mylar, MD  nicotine  polacrilex (NICOTINE  MINI) 4 MG lozenge Take 1 lozenge (4 mg total) by mouth as needed. 01/14/24   Cyrena Mylar, MD  potassium chloride  SA (KLOR-CON  M) 20 MEQ tablet Take 2 tablets (40 mEq total) by mouth daily. 03/13/24   Donette Ellouise LABOR, FNP  sacubitril -valsartan  (ENTRESTO ) 24-26 MG Take 1 tablet by mouth 2 (two) times daily. 02/14/24 02/13/25  Lorene Lesley CROME, PA-C  spironolactone  (ALDACTONE ) 25 MG tablet Take 1 tablet (25 mg total) by mouth daily. 01/24/24 05/19/24  Donette Ellouise LABOR, FNP  thiamine  (VITAMIN B-1) 100 MG tablet Take 1 tablet (100 mg total) by mouth daily. 01/21/24   Caleen Qualia, MD    Physical Exam: Vitals:   07/10/24 0250 07/10/24 0730 07/10/24 0745 07/10/24 0800  BP: 115/86 98/71  94/72  Pulse: 76 73 72 80  Resp: (!) 21 20     Temp: (!) 97.5 F (36.4 C) 97.6 F (36.4 C)    TempSrc: Oral Oral    SpO2: 99% 93% 95% 92%    Physical Exam   Constitutional: Alert, awake, calm, comfortable HEENT: Neck supple Respiratory: Clear to auscultation B/L, no wheezing, no rales.  Cardiovascular: Regular rate and rhythm, no murmurs / rubs / gallops. No extremity edema. 2+ pedal pulses. No carotid bruits.  Abdomen: Soft, no tenderness, Bowel sounds positive.  Left costovertebral angle tenderness present Musculoskeletal: no clubbing / cyanosis. Good ROM, no contractures. Normal muscle tone.  Skin: no rashes, lesions, ulcers. Neurologic: CN 2-12 grossly intact. Sensation intact, No focal deficit identified Psychiatric: Alert and oriented x 3. Normal mood.    Labs on Admission: I have personally reviewed following labs and imaging studies  CBC: Recent Labs  Lab 07/10/24 0257  WBC 9.7  HGB 16.7  HCT 50.4  MCV 89.7  PLT 175   Basic Metabolic Panel: Recent Labs  Lab 07/10/24 0257  NA 137  K 4.2  CL 95*  CO2 31  GLUCOSE 99  BUN 15  CREATININE 1.27*  CALCIUM  10.1   GFR: CrCl cannot be calculated (Unknown ideal weight.). Liver Function Tests: Recent Labs  Lab 07/10/24 0257  AST 29  ALT 17  ALKPHOS 126  BILITOT 1.0  PROT 8.3*  ALBUMIN 4.9   Recent Labs  Lab 07/10/24 0405  LIPASE 21   No results for input(s): AMMONIA in the last 168 hours. Coagulation Profile: No results for input(s): INR, PROTIME in the last 168 hours. Cardiac Enzymes: No results for input(s): CKTOTAL, CKMB, CKMBINDEX, TROPONINI, TROPONINIHS in the last 168 hours. BNP (last 3 results) Recent Labs    01/14/24 0454  BNP 401.0*   HbA1C: No results for input(s): HGBA1C in the last 72 hours. CBG: No results for input(s): GLUCAP in the last 168 hours. Lipid Profile: No results for input(s): CHOL, HDL, LDLCALC, TRIG, CHOLHDL, LDLDIRECT in the last 72 hours. Thyroid Function Tests: No results for  input(s): TSH, T4TOTAL, FREET4, T3FREE, THYROIDAB in the last 72 hours. Anemia Panel: No results for input(s): VITAMINB12, FOLATE, FERRITIN, TIBC, IRON, RETICCTPCT in the last 72 hours. Urine analysis:    Component Value Date/Time   COLORURINE YELLOW (A) 07/10/2024 0257   APPEARANCEUR HAZY (A) 07/10/2024 0257   LABSPEC 1.012 07/10/2024 0257   PHURINE 6.0 07/10/2024 0257   GLUCOSEU >=500 (A) 07/10/2024 0257   HGBUR NEGATIVE 07/10/2024 0257   BILIRUBINUR NEGATIVE 07/10/2024 0257   KETONESUR NEGATIVE 07/10/2024 0257   PROTEINUR 100 (A) 07/10/2024 0257   NITRITE NEGATIVE 07/10/2024 0257   LEUKOCYTESUR NEGATIVE 07/10/2024 0257    Radiological Exams on Admission: I have personally reviewed images CT ABDOMEN PELVIS W CONTRAST Result Date: 07/10/2024 CLINICAL DATA:  Generalized abdominal pain. Constipation versus obstruction. EXAM: CT ABDOMEN AND PELVIS WITH CONTRAST TECHNIQUE: Multidetector CT imaging of the abdomen and pelvis was performed using the standard protocol following bolus administration of intravenous contrast. RADIATION DOSE REDUCTION: This exam was performed according to the departmental dose-optimization program which includes automated exposure control, adjustment of the mA and/or kV according to patient size and/or use of iterative reconstruction technique. CONTRAST:  OMNIPAQUE  IOHEXOL  300 MG/ML  SOLN COMPARISON:  None Available. FINDINGS: Lower chest: Bronchial wall thickening and peripheral small airway impaction noted in both lower lobes. No pleural effusion. Hepatobiliary: No suspicious focal abnormality within the liver parenchyma. There is no evidence for gallstones, gallbladder wall thickening, or pericholecystic fluid. No intrahepatic or extrahepatic biliary dilation. Pancreas: No focal mass lesion. No dilatation of the main duct. No intraparenchymal cyst. No peripancreatic edema. Spleen: No splenomegaly. No suspicious focal mass lesion.  Adrenals/Urinary Tract: No adrenal nodule or mass. Mild atrophy left kidney. Subtle decreased perfusion lower left kidney on delayed imaging may be related to vascular disease or scarring. Pyelonephritis not excluded. Tiny well-defined homogeneous low-density lesion in the left kidney is too small to characterize but is statistically most likely benign and probably a cyst. No followup imaging is recommended. Right kidney unremarkable. No evidence for hydroureter. The urinary bladder appears normal for the degree of distention. Stomach/Bowel: Stomach is unremarkable. No gastric wall thickening. No evidence of outlet obstruction. Duodenum is normally positioned as is the ligament of Treitz. Duodenal diverticulum noted. No small bowel wall thickening. No small bowel dilatation. The terminal ileum is normal. The  appendix is not well visualized, but there is no edema or inflammation in the region of the cecal tip to suggest appendicitis. Moderate to large stool volume noted right and transverse colon. Left colon and rectum relatively decompressed. Mild left colonic diverticulosis without diverticulitis. Vascular/Lymphatic: There is advanced atherosclerotic calcification of the abdominal aorta without aneurysm. There is no gastrohepatic or hepatoduodenal ligament lymphadenopathy. No retroperitoneal or mesenteric lymphadenopathy. No pelvic sidewall lymphadenopathy. Reproductive: Prostate gland appears mildly enlarged. Other: No substantial intraperitoneal free fluid. Musculoskeletal: No worrisome lytic or sclerotic osseous abnormality. Mild inferior endplate compression deformity at L2. Moderate superior endplate compression deformity at T12. IMPRESSION: 1. No acute findings in the abdomen or pelvis. Specifically, no findings to explain the patient's history of abdominal pain. 2. Moderate to large stool volume right and transverse colon. Left colon and rectum relatively decompressed. 3. Mild left colonic diverticulosis  without diverticulitis. 4. Subtle decreased perfusion lower left kidney on delayed imaging may be related to vascular disease or scarring. Pyelonephritis not excluded. 5. Bronchial wall thickening and peripheral small airway impaction in both lower lobes. Findings compatible with chronic bronchitis. Aspiration not excluded. 6.  Aortic Atherosclerosis (ICD10-I70.0). Electronically Signed   By: Camellia Candle M.D.   On: 07/10/2024 05:23    EKG: NA    Assessment/Plan Principal Problem:   Acute pyelonephritis Active Problems:   CHF exacerbation (HCC)   Coronary artery disease of native artery of native heart with stable angina pectoris   Persistent atrial fibrillation (HCC)    Assessment and Plan: 79 year old male with history of CHF, COPD, atrial fibrillation on Eliquis , CAD who came into ED at [facility] complaining of abdominal pain and constipation.  1.  Acute left-sided pyelonephrosis - He will be placed in observation - He was started on antibiotic in the emergency room ceftriaxone . - Cultures were sent. - I will continue antibiotics, follow the cultures, pain medications.  2.  Constipation - This might have caused some contribution to UTI/pyelonephritis 2 - Will give him bowel regimen.  3.  Congestive heart failure without any exacerbation - Resume his home medications including metoprolol , Entresto , Lasix  and spironolactone  - Continue to monitor his symptoms.  4.  Chronic atrial fibrillation - Continue amiodarone , Eliquis , metoprolol   5.  Hyperlipidemia - Resume home dose of atorvastatin   6.  COPD without exacerbation - Continue albuterol   7.  Chronic smoking - Counseling was done for smoking cessation.     DVT prophylaxis: Eliquis  Code Status: Full Code Family Communication: None  Disposition Plan: Home  Consults called: None  Admission status: Observation, Med-Surg   Nena Rebel, MD [facility] 07/10/2024, 9:54 AM       [1]  Allergies Allergen  Reactions   Budeson-Glycopyrrol-Formoterol     Other Reaction(s): tongue swelling   Symbicort [Budesonide-Formoterol Fumarate]     Other Reaction(s): ineffective   Advair Diskus [Fluticasone-Salmeterol] Other (See Comments)    Pt states gave him the shakes   Levaquin [Levofloxacin] Other (See Comments)    Pt states made him feel extreme anger    "

## 2024-07-10 NOTE — ED Notes (Addendum)
 Pt to CT

## 2024-07-10 NOTE — ED Triage Notes (Signed)
 Pt BIB [facility] from home c/o constipation and severe back pain. Last BM 4-5 days ago. No NV. No abd pain. Has has burning with urination. Pt sts on Christmas he was helping his wife ambulate when she fell. Helped her down to the grown. He fell onto his knee that day. Has had lower back pain since.

## 2024-07-10 NOTE — ED Notes (Addendum)
 Pt reporting he is not a diabetic - Admission MD messaged in regard to CBG check and order for insulin  prior to administration

## 2024-07-10 NOTE — ED Provider Notes (Signed)
 "  [facility] Provider Note    Event Date/Time   First MD Initiated Contact with Patient 07/10/24 [phone]     (approximate)   History   Back Pain and Constipation   HPI  Terry Garcia is a 79 y.o. male with history of A-fib on Eliquis , hypertension, COPD, CHF, kidney stones who presents to the emergency department with complaints of lower abdominal pain that is intermittent, severe in nature with no bowel movement in several days and not passing gas.  Denies nausea or vomiting, fevers, hematuria but states he has had intermittent dysuria.  Has had previous open abdominal surgery for kidney stones, umbilical hernia repair.  Denies prior history of bowel obstruction.  States he has tried prune juice, Dulcolax, Ex-Lax at home without relief.  States pain becomes so severe at times that it caused him to fall tonight.  He denies any injury from the fall.  Denies any his head.   History provided by patient, EMS.    Past Medical History:  Diagnosis Date   Arterial atherosclerosis    per pt   COPD (chronic obstructive pulmonary disease) (HCC)    controlled with daily inhaler and albuterol  as needed   GERD (gastroesophageal reflux disease)    no meds needed   History of kidney stones    needed open abdominal surgery for stone in 1978, no stones since   Hypertension    amlodipine  and lisinopril/HCTZ   Right bundle branch block    per pt, managed by primary, does not see cardiologist    Past Surgical History:  Procedure Laterality Date   CARDIOVERSION N/A 01/19/2024   Procedure: CARDIOVERSION;  Surgeon: Perla Evalene PARAS, MD;  Location: [facility];  Service: Cardiovascular;  Laterality: N/A;   CYST REMOVAL LEG  1980   removal of cyst on knee cap    INSERTION OF MESH N/A 07/13/2018   Procedure: INSERTION OF MESH;  Surgeon: Vanderbilt Ned, MD;  Location: [facility];  Service: General;  Laterality: N/A;   KIDNEY SURGERY  1978   needed open abdominal  surgery to remove stone   PILONIDAL CYST EXCISION  1970   RIGHT/LEFT HEART CATH AND CORONARY ANGIOGRAPHY N/A 01/18/2024   Procedure: RIGHT/LEFT HEART CATH AND CORONARY ANGIOGRAPHY;  Surgeon: Mady Bruckner, MD;  Location: [facility];  Service: Cardiovascular;  Laterality: N/A;   TEE WITHOUT CARDIOVERSION N/A 01/19/2024   Procedure: ECHOCARDIOGRAM, TRANSESOPHAGEAL;  Surgeon: Perla Evalene PARAS, MD;  Location: [facility];  Service: Cardiovascular;  Laterality: N/A;   UMBILICAL HERNIA REPAIR N/A 07/13/2018   Procedure: UMBILICAL HERNIA REPAIR WITH MESH;  Surgeon: Vanderbilt Ned, MD;  Location: [facility];  Service: General;  Laterality: N/A;    MEDICATIONS:  Prior to Admission medications  Medication Sig Start Date End Date Taking? Authorizing Provider  albuterol  (PROVENTIL  HFA;VENTOLIN  HFA) 108 (90 Base) MCG/ACT inhaler Inhale 1-2 puffs into the lungs every 6 (six) hours as needed for wheezing or shortness of breath.    [provider]  amiodarone  (PACERONE ) 200 MG tablet Take 1 tablet (200 mg total) by mouth daily. 02/03/24   Lorene Lesley CROME, PA-C  apixaban  (ELIQUIS ) 5 MG TABS tablet Take 1 tablet (5 mg total) by mouth 2 (two) times daily. 02/14/24 02/13/25  Lorene Lesley CROME, PA-C  atorvastatin  (LIPITOR) 10 MG tablet Take 1 tablet (10 mg total) by mouth daily. 02/14/24 02/13/25  Lorene Lesley CROME, PA-C  dapagliflozin  propanediol (FARXIGA ) 10 MG TABS tablet Take 1  tablet (10 mg total) by mouth daily. 02/14/24 02/13/25  Lorene Lesley CROME, PA-C  folic acid  (FOLVITE ) 1 MG tablet Take 1 tablet (1 mg total) by mouth daily. 01/21/24   Amin, Sumayya, MD  furosemide  (LASIX ) 40 MG tablet Take 1.5 tablets (60 mg total) by mouth daily. 02/03/24   Lorene Lesley CROME, PA-C  Glycopyrrolate-Formoterol (BEVESPI AEROSPHERE) 9-4.8 MCG/ACT AERO Inhale 2 puffs into the lungs at bedtime.    [provider]  metoprolol  succinate (TOPROL -XL) 25 MG 24 hr tablet Take 0.5 tablets (12.5 mg total) by  mouth daily. Take with or immediately following a meal. 02/03/24 05/19/24  Lorene Lesley L, PA-C  nicotine  (NICODERM CQ  - DOSED IN MG/24 HOURS) 14 mg/24hr patch Place 1 patch (14 mg total) onto the skin daily. 01/14/24 01/13/25  Cyrena Mylar, MD  nicotine  polacrilex (NICOTINE  MINI) 4 MG lozenge Take 1 lozenge (4 mg total) by mouth as needed. 01/14/24   Cyrena Mylar, MD  potassium chloride  SA (KLOR-CON  M) 20 MEQ tablet Take 2 tablets (40 mEq total) by mouth daily. 03/13/24   Donette Ellouise LABOR, FNP  sacubitril -valsartan  (ENTRESTO ) 24-26 MG Take 1 tablet by mouth 2 (two) times daily. 02/14/24 02/13/25  Lorene Lesley CROME, PA-C  spironolactone  (ALDACTONE ) 25 MG tablet Take 1 tablet (25 mg total) by mouth daily. 01/24/24 05/19/24  Donette Ellouise LABOR, FNP  thiamine  (VITAMIN B-1) 100 MG tablet Take 1 tablet (100 mg total) by mouth daily. 01/21/24   Caleen Qualia, MD    Physical Exam   Triage Vital Signs: ED Triage Vitals  Encounter Vitals Group     BP 07/10/24 0250 115/86     Girls Systolic BP Percentile --      Girls Diastolic BP Percentile --      Boys Systolic BP Percentile --      Boys Diastolic BP Percentile --      Pulse Rate 07/10/24 0250 76     Resp 07/10/24 0250 (!) 21     Temp 07/10/24 0250 (!) 97.5 F (36.4 C)     Temp Source 07/10/24 0250 Oral     SpO2 07/10/24 0250 99 %     Weight --      Height --      Head Circumference --      Peak Flow --      Pain Score 07/10/24 0251 9     Pain Loc --      Pain Education --      Exclude from Growth Chart --     Most recent vital signs: Vitals:   07/10/24 0250  BP: 115/86  Pulse: 76  Resp: (!) 21  Temp: (!) 97.5 F (36.4 C)  SpO2: 99%    CONSTITUTIONAL: Alert, responds appropriately to questions.  Elderly, pleasant HEAD: Normocephalic, atraumatic EYES: Conjunctivae clear, pupils appear equal, sclera nonicteric ENT: normal nose; moist mucous membranes NECK: Supple, normal ROM CARD: RRR; S1 and S2 appreciated RESP: Normal chest excursion  without splinting or tachypnea; breath sounds clear and equal bilaterally; no wheezes, no rhonchi, no rales, no hypoxia or respiratory distress, speaking full sentences ABD/GI: Non-distended; soft, diffusely tender without guarding or rebound BACK: The back appears normal EXT: Normal ROM in all joints; no deformity noted, no edema SKIN: Normal color for age and race; warm; no rash on exposed skin NEURO: Moves all extremities equally, normal speech PSYCH: The patient's mood and manner are appropriate.   ED Results / Procedures / Treatments   LABS: (all labs ordered are  listed, but only abnormal results are displayed) Labs Reviewed  COMPREHENSIVE METABOLIC PANEL WITH GFR - Abnormal; Notable for the following components:      Result Value   Chloride 95 (*)    Creatinine, Ser 1.27 (*)    Total Protein 8.3 (*)    GFR, Estimated 59 (*)    All other components within normal limits  URINALYSIS, ROUTINE W REFLEX MICROSCOPIC - Abnormal; Notable for the following components:   Color, Urine YELLOW (*)    APPearance HAZY (*)    Glucose, UA >=500 (*)    Protein, ur 100 (*)    All other components within normal limits  URINE CULTURE  CBC  LIPASE, BLOOD     EKG:  EKG Interpretation Date/Time:    Ventricular Rate:    PR Interval:    QRS Duration:    QT Interval:    QTC Calculation:   R Axis:      Text Interpretation:           RADIOLOGY: My personal review and interpretation of imaging: CT scan concerning for left-sided pyelonephritis.  I have personally reviewed all radiology reports.   CT ABDOMEN PELVIS W CONTRAST Result Date: 07/10/2024 CLINICAL DATA:  Generalized abdominal pain. Constipation versus obstruction. EXAM: CT ABDOMEN AND PELVIS WITH CONTRAST TECHNIQUE: Multidetector CT imaging of the abdomen and pelvis was performed using the standard protocol following bolus administration of intravenous contrast. RADIATION DOSE REDUCTION: This exam was performed according to the  departmental dose-optimization program which includes automated exposure control, adjustment of the mA and/or kV according to patient size and/or use of iterative reconstruction technique. CONTRAST:  OMNIPAQUE  IOHEXOL  300 MG/ML  SOLN COMPARISON:  None Available. FINDINGS: Lower chest: Bronchial wall thickening and peripheral small airway impaction noted in both lower lobes. No pleural effusion. Hepatobiliary: No suspicious focal abnormality within the liver parenchyma. There is no evidence for gallstones, gallbladder wall thickening, or pericholecystic fluid. No intrahepatic or extrahepatic biliary dilation. Pancreas: No focal mass lesion. No dilatation of the main duct. No intraparenchymal cyst. No peripancreatic edema. Spleen: No splenomegaly. No suspicious focal mass lesion. Adrenals/Urinary Tract: No adrenal nodule or mass. Mild atrophy left kidney. Subtle decreased perfusion lower left kidney on delayed imaging may be related to vascular disease or scarring. Pyelonephritis not excluded. Tiny well-defined homogeneous low-density lesion in the left kidney is too small to characterize but is statistically most likely benign and probably a cyst. No followup imaging is recommended. Right kidney unremarkable. No evidence for hydroureter. The urinary bladder appears normal for the degree of distention. Stomach/Bowel: Stomach is unremarkable. No gastric wall thickening. No evidence of outlet obstruction. Duodenum is normally positioned as is the ligament of Treitz. Duodenal diverticulum noted. No small bowel wall thickening. No small bowel dilatation. The terminal ileum is normal. The appendix is not well visualized, but there is no edema or inflammation in the region of the cecal tip to suggest appendicitis. Moderate to large stool volume noted right and transverse colon. Left colon and rectum relatively decompressed. Mild left colonic diverticulosis without diverticulitis. Vascular/Lymphatic: There is advanced  atherosclerotic calcification of the abdominal aorta without aneurysm. There is no gastrohepatic or hepatoduodenal ligament lymphadenopathy. No retroperitoneal or mesenteric lymphadenopathy. No pelvic sidewall lymphadenopathy. Reproductive: Prostate gland appears mildly enlarged. Other: No substantial intraperitoneal free fluid. Musculoskeletal: No worrisome lytic or sclerotic osseous abnormality. Mild inferior endplate compression deformity at L2. Moderate superior endplate compression deformity at T12. IMPRESSION: 1. No acute findings in the abdomen or pelvis. Specifically,  no findings to explain the patient's history of abdominal pain. 2. Moderate to large stool volume right and transverse colon. Left colon and rectum relatively decompressed. 3. Mild left colonic diverticulosis without diverticulitis. 4. Subtle decreased perfusion lower left kidney on delayed imaging may be related to vascular disease or scarring. Pyelonephritis not excluded. 5. Bronchial wall thickening and peripheral small airway impaction in both lower lobes. Findings compatible with chronic bronchitis. Aspiration not excluded. 6.  Aortic Atherosclerosis (ICD10-I70.0). Electronically Signed   By: Camellia Candle M.D.   On: 07/10/2024 05:23     PROCEDURES:  Critical Care performed: No     Procedures    IMPRESSION / MDM / ASSESSMENT AND PLAN / ED COURSE  I reviewed the triage vital signs and the nursing notes.    Patient here with complaints of abdominal pain.  Concerned he is constipated as he has not had a bowel movement in several days but also not passing gas.  No vomiting.  The patient is on the cardiac monitor to evaluate for evidence of arrhythmia and/or significant heart rate changes.   DIFFERENTIAL DIAGNOSIS (includes but not limited to):   Constipation, ileus, bowel obstruction, colitis, diverticulitis, appendicitis, UTI, kidney stone, pyelonephritis, less likely volvulus, doubt perforation   Patient's  presentation is most consistent with acute presentation with potential threat to life or bodily function.   PLAN: Will obtain labs, urine, CT of the abdomen pelvis.  He declines anything for pain at this time stating that pain is severe at times but it waxes and wanes and right now it is manageable.  Will give gentle IV fluids.  Will keep n.p.o. at this time.   MEDICATIONS GIVEN IN ED: Medications  cefTRIAXone  (ROCEPHIN ) 2 g in sodium chloride  0.9 % 100 mL IVPB (has no administration in time range)  sodium chloride  0.9 % bolus 500 mL (500 mLs Intravenous New Bag/Given 07/10/24 0416)  iohexol  (OMNIPAQUE ) 300 MG/ML solution 100 mL (100 mLs Intravenous Contrast Given 07/10/24 0453)     ED COURSE: Labs show no leukocytosis.  Normal electrolytes, LFTs.  Urine shows small amount of red and white blood cells but no bacteria.  Will add on urine culture.   CT scan reviewed and interpreted by myself and the radiologist and does show moderate to large stool volume in the right and transverse colon but rectum is decompressed.  I doubt that an enema would be very helpful.  Recommended MiraLAX , Colace.   CT scan does show decreased perfusion in the lower left kidney that could be related to pyelonephritis.  Given his urine does appear like an early infection and he complains of dysuria I have recommended IV antibiotics.  Patient reports pain is starting to come back somewhat but still declines pain medication.  We discussed admission versus discharge home with close outpatient follow-up.  Patient would be comfortable with admission for observation.  Will discuss with the hospitalist.   CONSULTS:  Consulted and discussed patient's case with hospitalist, Dr. Cleatus.  I have recommended admission and consulting physician agrees and will place admission orders.  Patient (and family if present) agree with this plan.   I reviewed all nursing notes, vitals, pertinent previous records.  All labs, EKGs, imaging  ordered have been independently reviewed and interpreted by myself.    OUTSIDE RECORDS REVIEWED: Reviewed previous cardiology notes.       FINAL CLINICAL IMPRESSION(S) / ED DIAGNOSES   Final diagnoses:  Lower abdominal pain  Pyelonephritis  Constipation, unspecified constipation type  Rx / DC Orders   ED Discharge Orders     None        Note:  This document was prepared using Dragon voice recognition software and may include unintentional dictation errors.   Asuncion Shibata, Josette SAILOR, DO 07/10/24 [phone]  "

## 2024-07-11 DIAGNOSIS — N10 Acute tubulo-interstitial nephritis: Secondary | ICD-10-CM | POA: Diagnosis not present

## 2024-07-11 LAB — COMPREHENSIVE METABOLIC PANEL WITH GFR
ALT: 12 U/L (ref 0–44)
AST: 16 U/L (ref 15–41)
Albumin: 3.9 g/dL (ref 3.5–5.0)
Alkaline Phosphatase: 101 U/L (ref 38–126)
Anion gap: 10 (ref 5–15)
BUN: 12 mg/dL (ref 8–23)
CO2: 30 mmol/L (ref 22–32)
Calcium: 8.9 mg/dL (ref 8.9–10.3)
Chloride: 101 mmol/L (ref 98–111)
Creatinine, Ser: 1.04 mg/dL (ref 0.61–1.24)
GFR, Estimated: >60 mL/min
Glucose, Bld: 89 mg/dL (ref 70–99)
Potassium: 3.3 mmol/L — ABNORMAL LOW (ref 3.5–5.1)
Sodium: 141 mmol/L (ref 135–145)
Total Bilirubin: 0.6 mg/dL (ref 0.0–1.2)
Total Protein: 6.3 g/dL — ABNORMAL LOW (ref 6.5–8.1)

## 2024-07-11 LAB — CBC
HCT: 47.5 % (ref 39.0–52.0)
Hemoglobin: 15.5 g/dL (ref 13.0–17.0)
MCH: 29.5 pg (ref 26.0–34.0)
MCHC: 32.6 g/dL (ref 30.0–36.0)
MCV: 90.3 fL (ref 80.0–100.0)
Platelets: 148 K/uL — ABNORMAL LOW (ref 150–400)
RBC: 5.26 MIL/uL (ref 4.22–5.81)
RDW: 13.5 % (ref 11.5–15.5)
WBC: 6.7 K/uL (ref 4.0–10.5)
nRBC: 0 % (ref 0.0–0.2)

## 2024-07-11 LAB — URINE CULTURE: Culture: <10000 — AB

## 2024-07-11 MED ORDER — POTASSIUM CHLORIDE 20 MEQ PO PACK
40.0000 meq | PACK | Freq: Once | ORAL | Status: DC
  Filled 2024-07-11: qty 2

## 2024-07-11 MED ORDER — POTASSIUM CHLORIDE CRYS ER 20 MEQ PO TBCR
40.0000 meq | EXTENDED_RELEASE_TABLET | Freq: Once | ORAL | Status: AC
  Administered 2024-07-11: 40 meq via ORAL
  Filled 2024-07-11: qty 2

## 2024-07-11 MED ORDER — HYDROCODONE-ACETAMINOPHEN 5-325 MG PO TABS
1.0000 | ORAL_TABLET | Freq: Four times a day (QID) | ORAL | Status: AC | PRN
  Administered 2024-07-11: 1 via ORAL
  Administered 2024-07-11 – 2024-07-12 (×2): 2 via ORAL
  Filled 2024-07-11: qty 2
  Filled 2024-07-11: qty 1
  Filled 2024-07-11: qty 2

## 2024-07-11 MED ORDER — NICOTINE 21 MG/24HR TD PT24
21.0000 mg | MEDICATED_PATCH | Freq: Every day | TRANSDERMAL | Status: DC
  Administered 2024-07-11 – 2024-07-12 (×2): 21 mg via TRANSDERMAL
  Filled 2024-07-11 (×2): qty 1

## 2024-07-11 NOTE — Care Management Obs Status (Signed)
 MEDICARE OBSERVATION STATUS NOTIFICATION   Patient Details  Name: Terry Garcia MRN: [id number] Date of Birth: 17-Oct-1945   Medicare Observation Status Notification Given:  Yes    [name] 07/11/2024, 11:40 AM

## 2024-07-11 NOTE — Plan of Care (Signed)
" °  Problem: Education: Goal: Individualized Educational Video(s) Outcome: Progressing   Problem: Fluid Volume: Goal: Ability to maintain a balanced intake and output will improve Outcome: Progressing   Problem: Metabolic: Goal: Ability to maintain appropriate glucose levels will improve Outcome: Progressing   Problem: Nutritional: Goal: Maintenance of adequate nutrition will improve Outcome: Progressing   Problem: Skin Integrity: Goal: Risk for impaired skin integrity will decrease Outcome: Progressing   Problem: Tissue Perfusion: Goal: Adequacy of tissue perfusion will improve Outcome: Progressing   Problem: Education: Goal: Knowledge of General Education information will improve Description: Including pain rating scale, medication(s)/side effects and non-pharmacologic comfort measures Outcome: Progressing   Problem: Activity: Goal: Risk for activity intolerance will decrease Outcome: Progressing   Problem: Nutrition: Goal: Adequate nutrition will be maintained Outcome: Progressing   Problem: Coping: Goal: Level of anxiety will decrease Outcome: Progressing   Problem: Elimination: Goal: Will not experience complications related to bowel motility Outcome: Progressing Goal: Will not experience complications related to urinary retention Outcome: Progressing   Problem: Safety: Goal: Ability to remain free from injury will improve Outcome: Progressing   Problem: Skin Integrity: Goal: Risk for impaired skin integrity will decrease Outcome: Progressing   "

## 2024-07-11 NOTE — TOC CM/SW Note (Signed)
 Transition of Care [facility]) CM/SW Note    Transition of Care [facility]) - Inpatient Brief Assessment   Patient Details  Name: Terry Garcia MRN: [id number] Date of Birth: 12-26-1945  Transition of [facility]) CM/SW Contact:    Alfonso Rummer, LCSW Phone Number: 07/11/2024, 9:48 AM   Clinical Narrative:  Completed toc chart review. No TOC needs at this time. Please contact should needs arise.   Transition of Care Asessment: Insurance and Status: Insurance coverage has been reviewed Patient has primary care physician:  ([facility] [employer]  [employer] Home environment has been reviewed: single family home Prior level of function:: independent Prior/Current Home Services: No current home services Social Drivers of Health Review: SDOH reviewed no interventions necessary Readmission risk has been reviewed: No Transition of care needs: no transition of care needs at this time

## 2024-07-11 NOTE — Progress Notes (Signed)
 " Progress Note   Patient: Terry Garcia FMW:[id number] DOB: [birth year] DOA: 07/10/2024     0 DOS: the patient was seen and examined on 07/11/2024   Brief hospital course: Terry Garcia is a pleasant 79 y.o. male with medical history significant for A-fib on Eliquis , HTN, COPD not on home oxygen, CHF, kidney stones, GERD who came into ED complaining of lower abdominal pain which is intermittent, severe in nature, 8/10 in intensity, with no bowel movement for 4 to 5 days.   ED Course: Upon arrival to the ED, patient is found to have decreased perfusion in left kidney could be related to pyelonephritis.  Urine analysis was mildly positive for UTI.  Patient was given IV fluid, pain medication, IV antibiotic ceftriaxone .  Blood cultures were sent.  Hospitalist service was consulted for evaluation for admission for possible acute pyelonephritis.  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     Chronic atrial fibrillation - Continue amiodarone , Eliquis , metoprolol    Hyperlipidemia Continue home dose of atorvastatin    COPD without exacerbation - Continue albuterol    Chronic smoking - Counseling was done for smoking cessation. Continue nicotine  patch      DVT prophylaxis: Eliquis   Code Status: Full Code  Family Communication: None   Disposition Plan: Home   Consults called: None    Subjective:  Patient seen and examined at bedside this morning Denies nausea vomiting abdominal pain chest pain or  cough He admits to improvement in his constipation  Physical Exam: Constitutional: Alert, awake, calm, comfortable HEENT: Neck supple Respiratory: Clear to auscultation B/L Cardiovascular: Regular rate and rhythm Abdomen: Soft, no tenderness, Bowel sounds positive.  Left costovertebral angle tenderness present Musculoskeletal: no clubbing / cyanosis.  Skin: no rashes, lesions, ulcers. Neurologic: CN 2-12 grossly intact. Sensation intact, No focal deficit identified Psychiatric: Alert and oriented x 3. Normal mood.      Vitals:   07/10/24 1700 07/10/24 2021 07/11/24 0358 07/11/24 0740  BP: 105/70 (!) 160/96 111/84 136/76  Pulse: 77 71 (!) 43 61  Resp: 18 16 16 17   Temp: 98.9 F (37.2 C) 99 F (37.2 C) 98 F (36.7 C) 98.7 F (37.1 C)  TempSrc: Oral   Oral  SpO2: 98% 94% 91% 93%  Weight:  79.9 kg    Height:  5' 11 (1.803 m)        Latest Ref Rng & Units 07/11/2024    5:09 AM 07/10/2024    2:57 AM 01/20/2024    3:55 AM  CBC  WBC 4.0 - 10.5 K/uL 6.7  9.7  10.0   Hemoglobin 13.0 - 17.0 g/dL 84.4  83.2  86.2   Hematocrit 39.0 - 52.0 % 47.5  50.4  40.8   Platelets 150 - 400 K/uL 148  175  167        Latest Ref Rng & Units 07/11/2024    5:09 AM 07/10/2024    2:57 AM 03/13/2024   10:46 AM  BMP  Glucose 70 - 99 mg/dL 89  99  94   BUN 8 - 23 mg/dL 12  15  9   Creatinine 0.61 - 1.24 mg/dL 8.95  8.72  8.81   BUN/Creat Ratio 10 - 24   8   Sodium 135 - 145 mmol/L 141  137  137   Potassium 3.5 - 5.1 mmol/L 3.3  4.2  4.4   Chloride 98 - 111 mmol/L 101  95  96   CO2 22 - 32 mmol/L 30  31  27    Calcium  8.9 - 10.3 mg/dL 8.9  89.8  9.3      Author: Drue ONEIDA Potter, MD 07/11/2024 4:39 PM  For on call review [email or web address].  "

## 2024-07-12 ENCOUNTER — Other Ambulatory Visit: Payer: Self-pay

## 2024-07-12 DIAGNOSIS — N10 Acute tubulo-interstitial nephritis: Secondary | ICD-10-CM | POA: Diagnosis not present

## 2024-07-12 LAB — CBC WITH DIFFERENTIAL/PLATELET
Abs Immature Granulocytes: 0.03 K/uL (ref 0.00–0.07)
Basophils Absolute: 0.1 K/uL (ref 0.0–0.1)
Basophils Relative: 1 %
Eosinophils Absolute: 0.2 K/uL (ref 0.0–0.5)
Eosinophils Relative: 3 %
HCT: 47.9 % (ref 39.0–52.0)
Hemoglobin: 15.7 g/dL (ref 13.0–17.0)
Immature Granulocytes: 0 %
Lymphocytes Relative: 41 %
Lymphs Abs: 3.2 K/uL (ref 0.7–4.0)
MCH: 30 pg (ref 26.0–34.0)
MCHC: 32.8 g/dL (ref 30.0–36.0)
MCV: 91.4 fL (ref 80.0–100.0)
Monocytes Absolute: 0.6 K/uL (ref 0.1–1.0)
Monocytes Relative: 7 %
Neutro Abs: 3.7 K/uL (ref 1.7–7.7)
Neutrophils Relative %: 48 %
Platelets: 163 K/uL (ref 150–400)
RBC: 5.24 MIL/uL (ref 4.22–5.81)
RDW: 13.3 % (ref 11.5–15.5)
WBC: 7.7 K/uL (ref 4.0–10.5)
nRBC: 0 % (ref 0.0–0.2)

## 2024-07-12 LAB — BASIC METABOLIC PANEL WITH GFR
Anion gap: 10 (ref 5–15)
BUN: 19 mg/dL (ref 8–23)
CO2: 29 mmol/L (ref 22–32)
Calcium: 9 mg/dL (ref 8.9–10.3)
Chloride: 100 mmol/L (ref 98–111)
Creatinine, Ser: 1.25 mg/dL — ABNORMAL HIGH (ref 0.61–1.24)
GFR, Estimated: 60 mL/min — ABNORMAL LOW
Glucose, Bld: 119 mg/dL — ABNORMAL HIGH (ref 70–99)
Potassium: 3.7 mmol/L (ref 3.5–5.1)
Sodium: 139 mmol/L (ref 135–145)

## 2024-07-12 MED ORDER — CEFADROXIL 500 MG PO CAPS
500.0000 mg | ORAL_CAPSULE | Freq: Two times a day (BID) | ORAL | 0 refills | Status: AC
  Filled 2024-07-12: qty 10, 5d supply, fill #0

## 2024-07-12 MED ORDER — OXYCODONE HCL 5 MG PO TABS
5.0000 mg | ORAL_TABLET | Freq: Four times a day (QID) | ORAL | 0 refills | Status: AC | PRN
  Filled 2024-07-12: qty 12, 3d supply, fill #0

## 2024-07-12 NOTE — Plan of Care (Signed)
" °  Problem: Fluid Volume: Goal: Ability to maintain a balanced intake and output will improve Outcome: Progressing   Problem: Nutritional: Goal: Maintenance of adequate nutrition will improve Outcome: Progressing Goal: Progress toward achieving an optimal weight will improve Outcome: Progressing   Problem: Skin Integrity: Goal: Risk for impaired skin integrity will decrease Outcome: Progressing   Problem: Tissue Perfusion: Goal: Adequacy of tissue perfusion will improve Outcome: Progressing   Problem: Education: Goal: Knowledge of General Education information will improve Description: Including pain rating scale, medication(s)/side effects and non-pharmacologic comfort measures Outcome: Progressing   Problem: Clinical Measurements: Goal: Will remain free from infection Outcome: Progressing Goal: Diagnostic test results will improve Outcome: Progressing Goal: Respiratory complications will improve Outcome: Progressing Goal: Cardiovascular complication will be avoided Outcome: Progressing   Problem: Activity: Goal: Risk for activity intolerance will decrease Outcome: Progressing   Problem: Nutrition: Goal: Adequate nutrition will be maintained Outcome: Progressing   Problem: Elimination: Goal: Will not experience complications related to bowel motility Outcome: Progressing Goal: Will not experience complications related to urinary retention Outcome: Progressing   Problem: Pain Managment: Goal: General experience of comfort will improve and/or be controlled Outcome: Progressing   Problem: Safety: Goal: Ability to remain free from injury will improve Outcome: Progressing   Problem: Skin Integrity: Goal: Risk for impaired skin integrity will decrease Outcome: Progressing   "

## 2024-07-12 NOTE — Discharge Summary (Addendum)
 Physician Discharge Summary  Terry Garcia FMW:[id number] DOB: 1946-02-03 DOA: 07/10/2024  PCP: [facility]   Admit date: 07/10/2024 Discharge date: 07/12/2024  Admitted From: home  Disposition:  home   Recommendations for Outpatient Follow-up:  Follow up with PCP in 1-2 weeks   Home Health: no  Equipment/Devices:  Discharge Condition: stable  CODE STATUS: full  Diet recommendation: Regular   Brief/Interim Summary: HPI was taken from Dr. Roann: Terry Garcia is a pleasant 79 y.o. male with medical history significant for A-fib on Eliquis , HTN, COPD not on home oxygen, CHF, kidney stones, GERD who came into ED complaining of lower abdominal pain which is intermittent, severe in nature, 8/10 in intensity, with no bowel movement for 4 to 5 days.  He also stated that he did not pass gas.  He stated that he tried prune juice, Dulcolax, Ex-Lax at home without relief.  He also stated that his pain became so severe he also fell without any injury at home.  He denies any pain in his head. He denied any nausea, vomiting, fever, chest pain, cough, shortness of breath, hematuria, hematemesis, melena.  He said he abdominal surgery for kidney stones, umbilical hernia repair.  He denies prior history of bowel obstruction.     ED Course: Upon arrival to the ED, patient is found to have decreased perfusion in left kidney could be related to pyelonephritis.  Urine analysis was mildly positive for UTI.  Patient was given IV fluid, pain medication, IV antibiotic ceftriaxone .  Blood cultures were sent.  Hospitalist service was consulted for evaluation for admission for possible acute pyelonephritis.  Discharge Diagnoses:  Principal Problem:   Acute pyelonephritis Active Problems:   CHF exacerbation (HCC)   Coronary artery disease of native artery of native heart with stable angina pectoris   Persistent atrial fibrillation (HCC)  Possible acute left-sided pyelonephritis:  continue on IV rocephin  and d/c home w/ cefadroxil  at d/c. CT scan of the abdomen showing findings of possible left-sided pyelonephritis or scarring. Urine cx shows insignificant growth   Likely blood cx containment: blood cx growing 1 of 4 bottles, gram positive rods. No symptoms on sepsis. Non-toxic appearing. Normal WBCs, no fevers  Constipation: resolved   Chronic systolic CHF: appears compensated. Continue on home dose of metoprolol , entresto , lasix , aldactone . Last echo August 2025 showed EF 40 to 45%  Chronic a. fib: continue on amio, metoprolol , eliquis   HLD: continue on statin   Hx of COPD:without exacerbation. Bronchodilators prn    Chronic smoking: received cessation counseling already. Nicotine  patch to prevent w/drawals   Discharge Instructions  Discharge Instructions     Diet general   Complete by: As directed    Discharge instructions   Complete by: As directed    F/u w/ PCP in 1-2 weeks   Increase activity slowly   Complete by: As directed       Allergies as of 07/12/2024       Reactions   Budeson-glycopyrrol-formoterol    Other Reaction(s): tongue swelling   Symbicort [budesonide-formoterol Fumarate]    Other Reaction(s): ineffective   Advair Diskus [fluticasone-salmeterol] Other (See Comments)   Pt states gave him the shakes   Levaquin [levofloxacin] Other (See Comments)   Pt states made him feel extreme anger        Medication List     TAKE these medications    albuterol  108 (90 Base) MCG/ACT inhaler Commonly known as: VENTOLIN  HFA Inhale 1-2 puffs into the lungs  every 6 (six) hours as needed for wheezing or shortness of breath.   amiodarone  200 MG tablet Commonly known as: Pacerone  Take 1 tablet (200 mg total) by mouth daily.   apixaban  5 MG Tabs tablet Commonly known as: ELIQUIS  Take 1 tablet (5 mg total) by mouth 2 (two) times daily.   atorvastatin  10 MG tablet Commonly known as: LIPITOR Take 1 tablet (10 mg total) by mouth daily.    Bevespi Aerosphere 9-4.8 MCG/ACT Aero Generic drug: Glycopyrrolate-Formoterol Inhale 2 puffs into the lungs at bedtime.   cefadroxil  500 MG capsule Commonly known as: DURICEF Take 1 capsule (500 mg total) by mouth 2 (two) times daily for 5 days.   dapagliflozin  propanediol 10 MG Tabs tablet Commonly known as: FARXIGA  Take 1 tablet (10 mg total) by mouth daily.   folic acid  1 MG tablet Commonly known as: FOLVITE  Take 1 tablet (1 mg total) by mouth daily.   furosemide  40 MG tablet Commonly known as: LASIX  Take 1.5 tablets (60 mg total) by mouth daily.   metoprolol  succinate 25 MG 24 hr tablet Commonly known as: TOPROL -XL Take 0.5 tablets (12.5 mg total) by mouth daily. Take with or immediately following a meal.   oxyCODONE  5 MG immediate release tablet Commonly known as: Roxicodone  Take 1 tablet (5 mg total) by mouth every 6 (six) hours as needed for up to 3 days for moderate pain (pain score 4-6) or severe pain (pain score 7-10).   potassium chloride  SA 20 MEQ tablet Commonly known as: KLOR-CON  M Take 2 tablets (40 mEq total) by mouth daily. What changed: how much to take   sacubitril -valsartan  24-26 MG Commonly known as: Entresto  Take 1 tablet by mouth 2 (two) times daily.   spironolactone  25 MG tablet Commonly known as: ALDACTONE  Take 1 tablet (25 mg total) by mouth daily. What changed: additional instructions   thiamine  100 MG tablet Commonly known as: VITAMIN B1 Take 1 tablet (100 mg total) by mouth daily.        Allergies[1]  Consultations:    Procedures/Studies: CT ABDOMEN PELVIS W CONTRAST Result Date: 07/10/2024 CLINICAL DATA:  Generalized abdominal pain. Constipation versus obstruction. EXAM: CT ABDOMEN AND PELVIS WITH CONTRAST TECHNIQUE: Multidetector CT imaging of the abdomen and pelvis was performed using the standard protocol following bolus administration of intravenous contrast. RADIATION DOSE REDUCTION: This exam was performed according to the  departmental dose-optimization program which includes automated exposure control, adjustment of the mA and/or kV according to patient size and/or use of iterative reconstruction technique. CONTRAST:  OMNIPAQUE  IOHEXOL  300 MG/ML  SOLN COMPARISON:  None Available. FINDINGS: Lower chest: Bronchial wall thickening and peripheral small airway impaction noted in both lower lobes. No pleural effusion. Hepatobiliary: No suspicious focal abnormality within the liver parenchyma. There is no evidence for gallstones, gallbladder wall thickening, or pericholecystic fluid. No intrahepatic or extrahepatic biliary dilation. Pancreas: No focal mass lesion. No dilatation of the main duct. No intraparenchymal cyst. No peripancreatic edema. Spleen: No splenomegaly. No suspicious focal mass lesion. Adrenals/Urinary Tract: No adrenal nodule or mass. Mild atrophy left kidney. Subtle decreased perfusion lower left kidney on delayed imaging may be related to vascular disease or scarring. Pyelonephritis not excluded. Tiny well-defined homogeneous low-density lesion in the left kidney is too small to characterize but is statistically most likely benign and probably a cyst. No followup imaging is recommended. Right kidney unremarkable. No evidence for hydroureter. The urinary bladder appears normal for the degree of distention. Stomach/Bowel: Stomach is unremarkable. No gastric wall  thickening. No evidence of outlet obstruction. Duodenum is normally positioned as is the ligament of Treitz. Duodenal diverticulum noted. No small bowel wall thickening. No small bowel dilatation. The terminal ileum is normal. The appendix is not well visualized, but there is no edema or inflammation in the region of the cecal tip to suggest appendicitis. Moderate to large stool volume noted right and transverse colon. Left colon and rectum relatively decompressed. Mild left colonic diverticulosis without diverticulitis. Vascular/Lymphatic: There is advanced  atherosclerotic calcification of the abdominal aorta without aneurysm. There is no gastrohepatic or hepatoduodenal ligament lymphadenopathy. No retroperitoneal or mesenteric lymphadenopathy. No pelvic sidewall lymphadenopathy. Reproductive: Prostate gland appears mildly enlarged. Other: No substantial intraperitoneal free fluid. Musculoskeletal: No worrisome lytic or sclerotic osseous abnormality. Mild inferior endplate compression deformity at L2. Moderate superior endplate compression deformity at T12. IMPRESSION: 1. No acute findings in the abdomen or pelvis. Specifically, no findings to explain the patient's history of abdominal pain. 2. Moderate to large stool volume right and transverse colon. Left colon and rectum relatively decompressed. 3. Mild left colonic diverticulosis without diverticulitis. 4. Subtle decreased perfusion lower left kidney on delayed imaging may be related to vascular disease or scarring. Pyelonephritis not excluded. 5. Bronchial wall thickening and peripheral small airway impaction in both lower lobes. Findings compatible with chronic bronchitis. Aspiration not excluded. 6.  Aortic Atherosclerosis (ICD10-I70.0). Electronically Signed   By: Camellia Candle M.D.   On: 07/10/2024 05:23   (Echo, Carotid, EGD, Colonoscopy, ERCP)    Subjective: Pt c/o fatigue    Discharge Exam: Vitals:   07/12/24 0627 07/12/24 0901  BP: (!) 151/89 102/75  Pulse: 61 (!) 45  Resp:  14  Temp:  97.6 F (36.4 C)  SpO2: 93% 94%   Vitals:   07/11/24 2044 07/12/24 0520 07/12/24 0627 07/12/24 0901  BP: 101/72 95/61 (!) 151/89 102/75  Pulse: (!) 59 (!) 47 61 (!) 45  Resp: 18 18  14   Temp: 98 F (36.7 C) 98.3 F (36.8 C)  97.6 F (36.4 C)  TempSrc: Oral Oral    SpO2: 96% 95% 93% 94%  Weight:      Height:        General: Pt is alert, awake, not in acute distress Cardiovascular: S1/S2 +, no rubs, no gallops Respiratory: CTA bilaterally, no wheezing, no rhonchi Abdominal: Soft, NT, ND,  bowel sounds + Extremities: no cyanosis    The results of significant diagnostics from this hospitalization (including imaging, microbiology, ancillary and laboratory) are listed below for reference.     Microbiology: Recent Results (from the past 240 hours)  Urine Culture     Status: Abnormal   Collection Time: 07/10/24  2:57 AM   Specimen: Urine, Clean Catch  Result Value Ref Range Status   Specimen Description   Final    URINE, CLEAN CATCH Performed at [facility], [address] [place], [place] [address]    Special Requests   Final    NONE Performed at [facility], [address] [place], [place] [address]    Culture (A)  Final    <10,000 COLONIES/mL INSIGNIFICANT GROWTH Performed at [facility], [address] [place], [place] [address]    Report Status 07/11/2024 FINAL  Final  Blood culture (routine x 2)     Status: None (Preliminary result)   Collection Time: 07/10/24  7:43 AM   Specimen: BLOOD  Result Value Ref Range Status   Specimen Description BLOOD BLOOD RIGHT HAND  Final  Special Requests   Final    BOTTLES DRAWN AEROBIC AND ANAEROBIC Blood Culture results may not be optimal due to an inadequate volume of blood received in culture bottles   Culture   Final    NO GROWTH 2 DAYS Performed at [facility], [address], [place], [place] [address]    Report Status PENDING  Incomplete  Blood culture (routine x 2)     Status: None (Preliminary result)   Collection Time: 07/10/24  7:43 AM   Specimen: BLOOD  Result Value Ref Range Status   Specimen Description BLOOD BLOOD RIGHT FOREARM  Final   Special Requests   Final    BOTTLES DRAWN AEROBIC AND ANAEROBIC Blood Culture results may not be optimal due to an inadequate volume of blood received in culture bottles   Culture  Setup Time   Final    AEROBIC BOTTLE ONLY GRAM POSITIVE RODS CRITICAL RESULT CALLED TO, READ BACK BY AND VERIFIED WITH: TIFFANY GILCHRIST 07/12/24  1210 KLW Performed at [facility], [address] [place], [place] [address]    Culture GRAM POSITIVE RODS  Final   Report Status PENDING  Incomplete     Labs: BNP (last 3 results) Recent Labs    01/14/24 0454  BNP 401.0*   Basic Metabolic Panel: Recent Labs  Lab 07/10/24 0257 07/11/24 0509 07/12/24 0530  NA 137 141 139  K 4.2 3.3* 3.7  CL 95* 101 100  CO2 31 30 29   GLUCOSE 99 89 119*  BUN 15 12 19   CREATININE 1.27* 1.04 1.25*  CALCIUM  10.1 8.9 9.0   Liver Function Tests: Recent Labs  Lab 07/10/24 0257 07/11/24 0509  AST 29 16  ALT 17 12  ALKPHOS 126 101  BILITOT 1.0 0.6  PROT 8.3* 6.3*  ALBUMIN 4.9 3.9   Recent Labs  Lab 07/10/24 0405  LIPASE 21   No results for input(s): AMMONIA in the last 168 hours. CBC: Recent Labs  Lab 07/10/24 0257 07/11/24 0509 07/12/24 0530  WBC 9.7 6.7 7.7  NEUTROABS  --   --  3.7  HGB 16.7 15.5 15.7  HCT 50.4 47.5 47.9  MCV 89.7 90.3 91.4  PLT 175 148* 163   Cardiac Enzymes: No results for input(s): CKTOTAL, CKMB, CKMBINDEX, TROPONINI in the last 168 hours. BNP: Invalid input(s): POCBNP CBG: Recent Labs  Lab 07/10/24 1211  GLUCAP 104*   D-Dimer No results for input(s): DDIMER in the last 72 hours. Hgb A1c Recent Labs    07/10/24 0256  HGBA1C 5.4   Lipid Profile No results for input(s): CHOL, HDL, LDLCALC, TRIG, CHOLHDL, LDLDIRECT in the last 72 hours. Thyroid function studies No results for input(s): TSH, T4TOTAL, T3FREE, THYROIDAB in the last 72 hours.  Invalid input(s): FREET3 Anemia work up No results for input(s): VITAMINB12, FOLATE, FERRITIN, TIBC, IRON, RETICCTPCT in the last 72 hours. Urinalysis    Component Value Date/Time   COLORURINE YELLOW (A) 07/10/2024 0257   APPEARANCEUR HAZY (A) 07/10/2024 0257   LABSPEC 1.012 07/10/2024 0257   PHURINE 6.0 07/10/2024 0257   GLUCOSEU >=500 (A) 07/10/2024 0257   HGBUR NEGATIVE 07/10/2024  0257   BILIRUBINUR NEGATIVE 07/10/2024 0257   KETONESUR NEGATIVE 07/10/2024 0257   PROTEINUR 100 (A) 07/10/2024 0257   NITRITE NEGATIVE 07/10/2024 0257   LEUKOCYTESUR NEGATIVE 07/10/2024 0257   Sepsis Labs Recent Labs  Lab 07/10/24 0257 07/11/24 0509 07/12/24 0530  WBC 9.7 6.7 7.7   Microbiology Recent Results (from the past 240 hours)  Urine Culture     Status: Abnormal   Collection Time: 07/10/24  2:57 AM   Specimen: Urine, Clean Catch  Result Value Ref Range Status   Specimen Description   Final    URINE, CLEAN CATCH Performed at [facility], [address], [place], [place] [address]    Special Requests   Final    NONE Performed at [facility], [address] [place], [place] [address]    Culture (A)  Final    <10,000 COLONIES/mL INSIGNIFICANT GROWTH Performed at [facility], [address] [place], [place] [address]    Report Status 07/11/2024 FINAL  Final  Blood culture (routine x 2)     Status: None (Preliminary result)   Collection Time: 07/10/24  7:43 AM   Specimen: BLOOD  Result Value Ref Range Status   Specimen Description BLOOD BLOOD RIGHT HAND  Final   Special Requests   Final    BOTTLES DRAWN AEROBIC AND ANAEROBIC Blood Culture results may not be optimal due to an inadequate volume of blood received in culture bottles   Culture   Final    NO GROWTH 2 DAYS Performed at [facility], [address], [place], [place] [address]    Report Status PENDING  Incomplete  Blood culture (routine x 2)     Status: None (Preliminary result)   Collection Time: 07/10/24  7:43 AM   Specimen: BLOOD  Result Value Ref Range Status   Specimen Description BLOOD BLOOD RIGHT FOREARM  Final   Special Requests   Final    BOTTLES DRAWN AEROBIC AND ANAEROBIC Blood Culture results may not be optimal due to an inadequate volume of blood received in culture bottles   Culture  Setup Time   Final    AEROBIC BOTTLE ONLY GRAM POSITIVE  RODS CRITICAL RESULT CALLED TO, READ BACK BY AND VERIFIED WITH: TIFFANY GILCHRIST 07/12/24 1210 KLW Performed at [facility], [address] [place], [place] [address]    Culture GRAM POSITIVE RODS  Final   Report Status PENDING  Incomplete     Time coordinating discharge: 35 minutes  SIGNED:   Anthony CHRISTELLA Pouch, MD  [facility] 07/12/2024, 1:42 PM Pager   If 7PM-7AM, please contact night-coverage [email or web address]     [1]  Allergies Allergen Reactions   Budeson-Glycopyrrol-Formoterol     Other Reaction(s): tongue swelling   Symbicort [Budesonide-Formoterol Fumarate]     Other Reaction(s): ineffective   Advair Diskus [Fluticasone-Salmeterol] Other (See Comments)    Pt states gave him the shakes   Levaquin [Levofloxacin] Other (See Comments)    Pt states made him feel extreme anger

## 2024-07-12 NOTE — Progress Notes (Signed)
 PHARMACY - PHYSICIAN COMMUNICATION CRITICAL VALUE ALERT - BLOOD CULTURE IDENTIFICATION (BCID)  Terry Garcia is an 79 y.o. male who presented to [facility] on 07/10/2024 with a chief complaint of back pain  Assessment:  blood cultures from 1/5 reported as GPR in 1 of 4 bottles, BCID are not performed.  He is on antibiotics for possible UTI.   Name of physician (or Provider) Contacted: Dr Trudy  Current antibiotics: Ceftriaxone   Changes to prescribed antibiotics recommended:  No changes - likely contaminant.   No results found for this or any previous visit.  [name], PharmD, BCPS, BCIDP Work Cell: [phone] 07/12/2024 2:00 PM

## 2024-07-12 NOTE — Progress Notes (Signed)
 Mobility Specialist Progress Note:    07/12/24 1054  Mobility  Activity Ambulated with assistance  Level of Assistance Modified independent, requires aide device or extra time  Assistive Device None  Distance Ambulated (ft) 320 ft  Range of Motion/Exercises Active;All extremities  Activity Response Tolerated well  Mobility visit 1 Mobility  Mobility Specialist Start Time (ACUTE ONLY) 1052  Mobility Specialist Stop Time (ACUTE ONLY) 1104  Mobility Specialist Time Calculation (min) (ACUTE ONLY) 12 min   Pt received in bed, agreeable to mobility. Modi to stand and ambulate with no AD. Tolerated well, SpO2 >91% on RA and HR range 70-85 bpm throughout. Returned to room, all needs met.  Sherrilee Ditty Mobility Specialist Please contact via Special Educational Needs Teacher or  Rehab office at [phone]

## 2024-07-12 NOTE — Progress Notes (Signed)
 Discharge instructions were reviewed with patient. Questions were encouraged and answered. IV was removed. Belongings collected by patient.

## 2024-07-13 ENCOUNTER — Telehealth: Payer: Self-pay | Admitting: Family

## 2024-07-15 LAB — CULTURE, BLOOD (ROUTINE X 2): Culture: NO GROWTH

## 2024-08-02 ENCOUNTER — Ambulatory Visit: Admitting: Family

## 2024-08-10 ENCOUNTER — Ambulatory Visit: Admitting: Adult Health

## 2024-08-10 ENCOUNTER — Other Ambulatory Visit (HOSPITAL_COMMUNITY): Payer: Self-pay

## 2024-08-11 ENCOUNTER — Other Ambulatory Visit: Payer: Self-pay

## 2024-08-31 ENCOUNTER — Ambulatory Visit: Admitting: Family

## 2024-10-17 ENCOUNTER — Ambulatory Visit: Admitting: Physician Assistant
